# Patient Record
Sex: Female | Born: 1935 | Race: Black or African American | Hispanic: No | Marital: Single | Smoking: Never smoker
Health system: Southern US, Community
[De-identification: ages and names within clinical notes are randomized; demographics above are authoritative.]

## PROBLEM LIST (undated history)

## (undated) DIAGNOSIS — I10 Essential (primary) hypertension: Secondary | ICD-10-CM

## (undated) DIAGNOSIS — G839 Paralytic syndrome, unspecified: Secondary | ICD-10-CM

## (undated) DIAGNOSIS — I82409 Acute embolism and thrombosis of unspecified deep veins of unspecified lower extremity: Secondary | ICD-10-CM

## (undated) DIAGNOSIS — C50919 Malignant neoplasm of unspecified site of unspecified female breast: Secondary | ICD-10-CM

## (undated) DIAGNOSIS — C50911 Malignant neoplasm of unspecified site of right female breast: Secondary | ICD-10-CM

## (undated) HISTORY — PX: HERNIA REPAIR: SHX51

## (undated) HISTORY — PX: APPENDECTOMY: SHX54

## (undated) HISTORY — DX: Malignant neoplasm of unspecified site of right female breast: C50.911

## (undated) HISTORY — DX: Malignant neoplasm of unspecified site of unspecified female breast: C50.919

---

## 1997-08-19 ENCOUNTER — Ambulatory Visit (HOSPITAL_COMMUNITY): Admission: RE | Admit: 1997-08-19 | Discharge: 1997-08-19 | Payer: Self-pay | Admitting: *Deleted

## 1997-10-20 ENCOUNTER — Ambulatory Visit (HOSPITAL_COMMUNITY): Admission: RE | Admit: 1997-10-20 | Discharge: 1997-10-20 | Payer: Self-pay | Admitting: *Deleted

## 1998-08-01 ENCOUNTER — Other Ambulatory Visit: Admission: RE | Admit: 1998-08-01 | Discharge: 1998-08-01 | Payer: Self-pay | Admitting: Obstetrics

## 1998-11-04 ENCOUNTER — Encounter: Payer: Self-pay | Admitting: *Deleted

## 1998-11-04 ENCOUNTER — Ambulatory Visit (HOSPITAL_COMMUNITY): Admission: RE | Admit: 1998-11-04 | Discharge: 1998-11-04 | Payer: Self-pay | Admitting: *Deleted

## 1998-11-21 ENCOUNTER — Ambulatory Visit (HOSPITAL_COMMUNITY): Admission: RE | Admit: 1998-11-21 | Discharge: 1998-11-21 | Payer: Self-pay | Admitting: *Deleted

## 1999-11-21 ENCOUNTER — Ambulatory Visit (HOSPITAL_COMMUNITY): Admission: RE | Admit: 1999-11-21 | Discharge: 1999-11-21 | Payer: Self-pay | Admitting: *Deleted

## 1999-11-21 ENCOUNTER — Encounter: Payer: Self-pay | Admitting: *Deleted

## 2000-10-22 ENCOUNTER — Encounter: Payer: Self-pay | Admitting: *Deleted

## 2000-10-22 ENCOUNTER — Ambulatory Visit (HOSPITAL_COMMUNITY): Admission: RE | Admit: 2000-10-22 | Discharge: 2000-10-22 | Payer: Self-pay | Admitting: *Deleted

## 2001-10-24 ENCOUNTER — Ambulatory Visit (HOSPITAL_COMMUNITY): Admission: RE | Admit: 2001-10-24 | Discharge: 2001-10-24 | Payer: Self-pay | Admitting: *Deleted

## 2001-10-24 ENCOUNTER — Encounter: Payer: Self-pay | Admitting: *Deleted

## 2002-07-30 ENCOUNTER — Emergency Department (HOSPITAL_COMMUNITY): Admission: EM | Admit: 2002-07-30 | Discharge: 2002-07-30 | Payer: Self-pay | Admitting: Emergency Medicine

## 2002-07-30 ENCOUNTER — Encounter: Payer: Self-pay | Admitting: Emergency Medicine

## 2002-11-03 ENCOUNTER — Ambulatory Visit (HOSPITAL_COMMUNITY): Admission: RE | Admit: 2002-11-03 | Discharge: 2002-11-03 | Payer: Self-pay | Admitting: *Deleted

## 2002-11-03 ENCOUNTER — Encounter: Payer: Self-pay | Admitting: *Deleted

## 2003-04-05 ENCOUNTER — Ambulatory Visit (HOSPITAL_COMMUNITY): Admission: RE | Admit: 2003-04-05 | Discharge: 2003-04-05 | Payer: Self-pay | Admitting: Ophthalmology

## 2003-11-04 ENCOUNTER — Ambulatory Visit (HOSPITAL_COMMUNITY): Admission: RE | Admit: 2003-11-04 | Discharge: 2003-11-04 | Payer: Self-pay | Admitting: *Deleted

## 2004-11-06 ENCOUNTER — Ambulatory Visit (HOSPITAL_COMMUNITY): Admission: RE | Admit: 2004-11-06 | Discharge: 2004-11-06 | Payer: Self-pay | Admitting: Family Medicine

## 2005-11-09 ENCOUNTER — Ambulatory Visit (HOSPITAL_COMMUNITY): Admission: RE | Admit: 2005-11-09 | Discharge: 2005-11-09 | Payer: Self-pay | Admitting: Family Medicine

## 2006-10-24 ENCOUNTER — Ambulatory Visit (HOSPITAL_COMMUNITY): Admission: RE | Admit: 2006-10-24 | Discharge: 2006-10-24 | Payer: Self-pay | Admitting: Ophthalmology

## 2006-11-12 ENCOUNTER — Ambulatory Visit (HOSPITAL_COMMUNITY): Admission: RE | Admit: 2006-11-12 | Discharge: 2006-11-12 | Payer: Self-pay | Admitting: Family Medicine

## 2007-11-18 ENCOUNTER — Ambulatory Visit (HOSPITAL_COMMUNITY): Admission: RE | Admit: 2007-11-18 | Discharge: 2007-11-18 | Payer: Self-pay | Admitting: Internal Medicine

## 2008-11-19 ENCOUNTER — Ambulatory Visit (HOSPITAL_COMMUNITY): Admission: RE | Admit: 2008-11-19 | Discharge: 2008-11-19 | Payer: Self-pay | Admitting: Internal Medicine

## 2009-03-03 ENCOUNTER — Encounter: Payer: Self-pay | Admitting: Gastroenterology

## 2009-08-26 ENCOUNTER — Ambulatory Visit (HOSPITAL_COMMUNITY): Admission: RE | Admit: 2009-08-26 | Discharge: 2009-08-26 | Payer: Self-pay | Admitting: Internal Medicine

## 2009-08-26 ENCOUNTER — Encounter: Payer: Self-pay | Admitting: Orthopedic Surgery

## 2009-09-08 ENCOUNTER — Ambulatory Visit: Payer: Self-pay | Admitting: Orthopedic Surgery

## 2009-09-08 DIAGNOSIS — M5137 Other intervertebral disc degeneration, lumbosacral region: Secondary | ICD-10-CM

## 2009-09-08 DIAGNOSIS — M5430 Sciatica, unspecified side: Secondary | ICD-10-CM

## 2009-09-08 DIAGNOSIS — M4120 Other idiopathic scoliosis, site unspecified: Secondary | ICD-10-CM | POA: Insufficient documentation

## 2009-09-29 ENCOUNTER — Ambulatory Visit: Payer: Self-pay | Admitting: Orthopedic Surgery

## 2009-11-21 ENCOUNTER — Ambulatory Visit (HOSPITAL_COMMUNITY): Admission: RE | Admit: 2009-11-21 | Discharge: 2009-11-21 | Payer: Self-pay | Admitting: Internal Medicine

## 2010-05-30 NOTE — Assessment & Plan Note (Signed)
Summary: 3 wk RE-CHECK LT HIP,RESP TO MED/MEDICARE,MEDICAID/CAF   Visit Type:  Follow-up Referring Provider:  Dr. Felecia Shelling Primary Provider:  Dr. Felecia Shelling  CC:  RECHECK LEFT HIP.  History of Present Illness: I saw Angela Higgins in the office today for an initial visit.  She is a 77 years old woman with the complaint of:  left hip pain.   DX DDD W/ LUMBAR WITH SPINAL STENOSIS AND SCOLIOSIS   IMAGING L SPINE FILM HERE AND  HIP FILM [facility]   Meds: Lisinopril/HCTZ, Simvastatin, Tramadol, Naproxen 500, Vicodin 5, ASA.  Today is 3 week recheck on sciatica after prednisone pak and Neurontin 100 at hs.  The meds helped some, she still has tingling left leg.  She is much better with much less pain and just has some residual tingling in the LEFT leg. She has no problems with the Neurontin so far  Allergies: No Known Drug Allergies   Impression & Recommendations:  Problem # 1:  DEGENERATIVE DISC DISEASE, LUMBAR SPINE (ICD-722.52) Assessment Improved  Orders: Est. Patient Level II (16109)  Problem # 2:  SCOLIOSIS, LUMBAR SPINE (ICD-737.30) Assessment: Improved  Orders: Est. Patient Level II (60454)  Problem # 3:  SCIATICA (ICD-724.3) Assessment: Improved  Orders: Est. Patient Level II (09811)  Patient Instructions: 1)  continue gabapentin  at night and the naproxen 1 twice a day  2)  return as needed

## 2010-05-30 NOTE — Assessment & Plan Note (Signed)
Summary: LT HIP PAIN/XRAY [facility]/REF [facility]/MEDICARE,MCD/CAF   Vital Signs:  Patient profile:   77 year old female Height:      62 inches Weight:      116 pounds Pulse rate:   68 / minute Resp:     16 per minute  Vitals Entered By: Fuller Canada MD (Sep 08, 2009 10:16 AM)  Visit Type:  New patient Referring Provider:  Dr. Felecia Shelling Primary Provider:  Dr. Felecia Shelling  CC:  left hip pain.  History of Present Illness: I saw Angela Higgins in the office today for an initial visit.  She is a 77 years old woman with the complaint of:  left hip pain.  Xrays left hip 08/26/09 [facility] for review.  Meds: Lisinopril/HCTZ, Simvastatin, Tramadol, Naproxen 500, Vicodin 5, ASA.  Patient has pain for 4 weeks no injury, pain level is 10, constant, severe for the last 3 days, sudden onset, worse with moving, associated with numbness in the LEFT leg, unrelieved by Lortab 5 mg and tramadol.  Allergies (verified): No Known Drug Allergies  Past History:  Past Medical History: htn cholesterol  Past Surgical History: appendix hernia  Family History: Family History of Diabetes Family History of Arthritis  Social History: Patient is single.  retired no smoking no alcohol some caffeine use  Review of Systems Constitutional:  Complains of weight loss; denies weight gain, fever, chills, and fatigue. Cardiovascular:  Denies chest pain, palpitations, fainting, and murmurs. Respiratory:  Denies short of breath, wheezing, couch, tightness, pain on inspiration, and snoring . Gastrointestinal:  Complains of constipation; denies heartburn, nausea, vomiting, diarrhea, and blood in your stools. Genitourinary:  Denies frequency, urgency, difficulty urinating, painful urination, flank pain, and bleeding in urine. Neurologic:  Complains of tingling; denies numbness, unsteady gait, dizziness, tremors, and seizure. Musculoskeletal:  Denies joint pain, swelling, instability, stiffness, redness, heat, and muscle  pain. Endocrine:  Denies excessive thirst, exessive urination, and heat or cold intolerance. Psychiatric:  Denies nervousness, depression, anxiety, and hallucinations. Skin:  Complains of rash and itching; denies changes in the skin, poor healing, and redness. HEENT:  Denies blurred or double vision, eye pain, redness, and watering. Immunology:  Denies seasonal allergies, sinus problems, and allergic to bee stings. Hemoatologic:  Denies easy bleeding and brusing.  Physical Exam  Additional Exam:  Constitutional: vital signs see recorded values. General: normal development, nutrition, and grooming. No deformity. Body Habitus is small CDV: Observation and palpation was normal  Lymph: palpation of the lymph nodes were normal Skin: inspection and palpation of the skin revealed no abnormalities  Neuro: coordination: normal              DTR's abnormal              Sensation was normal  Psyche: Alert and oriented x 3. Mood was normal.  Affect: normal  MSK: Gait: normal   She has deformity of her feet with bunions bilaterally.  There is weakness in the LEFT foot and reflexes are zero in both knee and ankles of both limbs  Upper extremities show multiple small joint deformities as well.  There is thumb and palm deformity in the LEFT hand flexion deformity of LEFT wrist.  She has a negative straight leg raise.     Impression & Recommendations:  Problem # 1:  SCIATICA (ICD-724.3) Assessment New  Orders: New Patient Level III (16109)  Problem # 2:  SCOLIOSIS, LUMBAR SPINE (ICD-737.30) Assessment: New  Orders: New Patient Level III (60454)  Problem # 3:  DEGENERATIVE DISC DISEASE, LUMBAR SPINE (ICD-722.52) Assessment: New  x-ray of her LEFT hip shows no abnormality in the hip joint  She has x-rays of her lumbar spine which show a severe degenerative scoliosis with 40 curve in the lumbar region estimated.  She's not a surgical candidate we will try to treat her medically we will  start with a 12 day 5 mg prednisone Dosepak as well as Neurontin 100 mg at night  Orders: New Patient Level III (40981)  Patient Instructions: 1)  SCOLIOSIS [CURVE IN THE SPINE ] 2)  ARTHRITIS 3)  WITH  DISC DISEASE 4)  LETS TRY SOME MEDICATION A ND SEE IF THIS WILL DO THE TRICK  5)  RETURN IN 3 WEEKS

## 2010-05-30 NOTE — Letter (Signed)
Summary: History form  History form   Imported By: Jacklynn Ganong 09/14/2009 09:12:40  _____________________________________________________________________  External Attachment:    Type:   Image     Comment:   External Document

## 2010-05-30 NOTE — Medication Information (Signed)
Summary: Tax adviser   Imported By: Cammie Sickle 09/10/2009 17:39:54  _____________________________________________________________________  External Attachment:    Type:   Image     Comment:   External Document

## 2010-10-18 ENCOUNTER — Other Ambulatory Visit (HOSPITAL_COMMUNITY): Payer: Self-pay | Admitting: Internal Medicine

## 2010-10-18 DIAGNOSIS — Z1231 Encounter for screening mammogram for malignant neoplasm of breast: Secondary | ICD-10-CM

## 2010-11-23 ENCOUNTER — Ambulatory Visit (HOSPITAL_COMMUNITY)
Admission: RE | Admit: 2010-11-23 | Discharge: 2010-11-23 | Disposition: A | Discharge status: Home / Self Care | Payer: Medicare Other | Source: Ambulatory Visit | Attending: Internal Medicine | Admitting: Internal Medicine

## 2010-11-23 DIAGNOSIS — Z1231 Encounter for screening mammogram for malignant neoplasm of breast: Secondary | ICD-10-CM

## 2011-02-14 LAB — BASIC METABOLIC PANEL
CO2: 28
Calcium: 9.8
Chloride: 108
Creatinine, Ser: 0.97
GFR calc Af Amer: >60
Glucose, Bld: 96

## 2011-07-17 DIAGNOSIS — I739 Peripheral vascular disease, unspecified: Secondary | ICD-10-CM | POA: Diagnosis not present

## 2011-07-17 DIAGNOSIS — I10 Essential (primary) hypertension: Secondary | ICD-10-CM | POA: Diagnosis not present

## 2011-10-17 ENCOUNTER — Other Ambulatory Visit (HOSPITAL_COMMUNITY): Payer: Self-pay | Admitting: Internal Medicine

## 2011-10-17 DIAGNOSIS — Z1231 Encounter for screening mammogram for malignant neoplasm of breast: Secondary | ICD-10-CM

## 2011-10-29 DIAGNOSIS — I6789 Other cerebrovascular disease: Secondary | ICD-10-CM | POA: Diagnosis not present

## 2011-10-29 DIAGNOSIS — I10 Essential (primary) hypertension: Secondary | ICD-10-CM | POA: Diagnosis not present

## 2011-10-29 DIAGNOSIS — M1990 Unspecified osteoarthritis, unspecified site: Secondary | ICD-10-CM | POA: Diagnosis not present

## 2011-11-13 DIAGNOSIS — H35369 Drusen (degenerative) of macula, unspecified eye: Secondary | ICD-10-CM | POA: Diagnosis not present

## 2011-11-13 DIAGNOSIS — H33329 Round hole, unspecified eye: Secondary | ICD-10-CM | POA: Diagnosis not present

## 2011-11-13 DIAGNOSIS — H52229 Regular astigmatism, unspecified eye: Secondary | ICD-10-CM | POA: Diagnosis not present

## 2011-11-13 DIAGNOSIS — H1010 Acute atopic conjunctivitis, unspecified eye: Secondary | ICD-10-CM | POA: Diagnosis not present

## 2011-11-27 ENCOUNTER — Ambulatory Visit (HOSPITAL_COMMUNITY)
Admission: RE | Admit: 2011-11-27 | Discharge: 2011-11-27 | Disposition: A | Discharge status: Home / Self Care | Payer: Medicare Other | Source: Ambulatory Visit | Attending: Internal Medicine | Admitting: Internal Medicine

## 2011-11-27 DIAGNOSIS — Z1231 Encounter for screening mammogram for malignant neoplasm of breast: Secondary | ICD-10-CM | POA: Insufficient documentation

## 2011-12-20 DIAGNOSIS — Z961 Presence of intraocular lens: Secondary | ICD-10-CM | POA: Diagnosis not present

## 2011-12-20 DIAGNOSIS — H353190 Nonexudative age-related macular degeneration, unspecified eye, stage unspecified: Secondary | ICD-10-CM | POA: Diagnosis not present

## 2011-12-20 DIAGNOSIS — H35369 Drusen (degenerative) of macula, unspecified eye: Secondary | ICD-10-CM | POA: Diagnosis not present

## 2012-02-19 DIAGNOSIS — Z23 Encounter for immunization: Secondary | ICD-10-CM | POA: Diagnosis not present

## 2012-02-19 DIAGNOSIS — I6789 Other cerebrovascular disease: Secondary | ICD-10-CM | POA: Diagnosis not present

## 2012-02-19 DIAGNOSIS — I10 Essential (primary) hypertension: Secondary | ICD-10-CM | POA: Diagnosis not present

## 2012-05-19 DIAGNOSIS — D649 Anemia, unspecified: Secondary | ICD-10-CM | POA: Diagnosis not present

## 2012-05-19 DIAGNOSIS — I6789 Other cerebrovascular disease: Secondary | ICD-10-CM | POA: Diagnosis not present

## 2012-05-19 DIAGNOSIS — I10 Essential (primary) hypertension: Secondary | ICD-10-CM | POA: Diagnosis not present

## 2012-07-19 ENCOUNTER — Encounter (HOSPITAL_COMMUNITY): Payer: Self-pay

## 2012-07-19 ENCOUNTER — Emergency Department (HOSPITAL_COMMUNITY)
Admission: EM | Admit: 2012-07-19 | Discharge: 2012-07-19 | Disposition: A | Discharge status: Home / Self Care | Payer: Medicare Other | Attending: Emergency Medicine | Admitting: Emergency Medicine

## 2012-07-19 ENCOUNTER — Emergency Department (HOSPITAL_COMMUNITY): Payer: Medicare Other

## 2012-07-19 DIAGNOSIS — G8320 Monoplegia of upper limb affecting unspecified side: Secondary | ICD-10-CM | POA: Insufficient documentation

## 2012-07-19 DIAGNOSIS — Y939 Activity, unspecified: Secondary | ICD-10-CM | POA: Insufficient documentation

## 2012-07-19 DIAGNOSIS — M7989 Other specified soft tissue disorders: Secondary | ICD-10-CM | POA: Diagnosis not present

## 2012-07-19 DIAGNOSIS — I10 Essential (primary) hypertension: Secondary | ICD-10-CM | POA: Insufficient documentation

## 2012-07-19 DIAGNOSIS — W010XXA Fall on same level from slipping, tripping and stumbling without subsequent striking against object, initial encounter: Secondary | ICD-10-CM | POA: Insufficient documentation

## 2012-07-19 DIAGNOSIS — Y92009 Unspecified place in unspecified non-institutional (private) residence as the place of occurrence of the external cause: Secondary | ICD-10-CM | POA: Insufficient documentation

## 2012-07-19 DIAGNOSIS — S60229A Contusion of unspecified hand, initial encounter: Secondary | ICD-10-CM | POA: Insufficient documentation

## 2012-07-19 DIAGNOSIS — S60222A Contusion of left hand, initial encounter: Secondary | ICD-10-CM

## 2012-07-19 DIAGNOSIS — M79609 Pain in unspecified limb: Secondary | ICD-10-CM | POA: Diagnosis not present

## 2012-07-19 HISTORY — DX: Essential (primary) hypertension: I10

## 2012-07-19 HISTORY — DX: Paralytic syndrome, unspecified: G83.9

## 2012-07-19 NOTE — ED Notes (Signed)
Pt reports slipped and fell today and has large knot on top of left hand.  Denies any pain anywhere else.

## 2012-07-19 NOTE — ED Provider Notes (Signed)
History    This chart was scribed for Donnetta Hutching, MD, by Frederik Pear, ED scribe. The patient was seen in room [facility]18[facility] and the patient's care was started at 2011.    CSN: [id number]  Arrival date & time 07/19/12  1702   First MD Initiated Contact with Patient 07/19/12 2011      Chief Complaint  Patient presents with  . Hand Pain    (Consider location/radiation/quality/duration/timing/severity/associated sxs/prior treatment) The history is provided by the patient. No language interpreter was used.   Angela Higgins is a 79 y.o. female who presents to the Emergency Department complaining of sudden onset, constant, non-radiating left hand pain that is aggravated with movement and improved by holding it still and began earlier today when she fell in her home and hit her hand on the floor. She denies hitting her head or any LOC. She denies any other pain or symptoms. 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   Past Medical History  Diagnosis Date  . Hypertension   . Paralysis     Past Surgical History  Procedure Laterality Date  . Appendectomy    . Hernia repair      No family history on file.  History  Substance Use Topics  . Smoking status: Never Smoker   . Smokeless tobacco: Not on file  . Alcohol Use: No    OB History   Grav Para Term Preterm Abortions TAB SAB Ect Mult Living                  Review of Systems  Allergies  Review of patient's allergies indicates no known allergies.  Home Medications  No current outpatient prescriptions on file.  BP 198/66  Pulse 63  Temp(Src) 98 F (36.7 C) (Oral)  Resp 20  Wt 116 lb (52.617 kg)  BMI 21.21 kg/m2  SpO2 100%  Physical Exam  Nursing note and vitals reviewed. Constitutional: She is oriented to person, place, and time. She appears well-developed and well-nourished.  HENT:  Head: Normocephalic and atraumatic.  Eyes: Conjunctivae and EOM are normal. Pupils are equal, round, and reactive to light.   Neck: Normal range of motion. Neck supple.  Cardiovascular: Normal rate, regular rhythm and normal heart sounds.   Pulmonary/Chest: Effort normal and breath sounds normal.  Abdominal: Soft. Bowel sounds are normal.  Musculoskeletal: Normal range of motion.  There is a 3x5 cm hematoma on the dorsum of the left hand.  Neurological: She is alert and oriented to person, place, and time. No cranial nerve deficit.  Neurovascularly intact.  Skin: Skin is warm and dry.  Psychiatric: She has a normal mood and affect.    ED Course  Procedures (including critical care time)  DIAGNOSTIC STUDIES: Oxygen Saturation is 100% on room air, normal by my interpretation.    COORDINATION OF CARE:  20:30- Discussed planned course of treatment with the patient, including wrapping the hand and icing the area, who is agreeable at this time.  Labs Reviewed - No data to display Dg Hand Complete Left  07/19/2012  *RADIOLOGY REPORT*  Clinical Data: Hand pain and swelling.  The patient is contracted.  LEFT HAND - COMPLETE 3+ VIEW  Comparison: None  Findings: The patient's hand is contracted. No definite acute fracture, subluxation or dislocation identified. Remote appearing distal radial and ulnar fractures noted.  No focal bony lesions are present. Dorsal soft tissue swelling is noted.  IMPRESSION: Soft tissue swelling without acute bony abnormality.   Original Report  Authenticated By: Harmon Pier, M.D.    No diagnosis found.    MDM  Left hand X-ray negative. Physical exam suggests hematoma. No other injuries  I personally performed the services described in this documentation, which was scribed in my presence. The recorded information has been reviewed and is accurate.        Donnetta Hutching, MD 07/19/12 2118

## 2012-08-07 DIAGNOSIS — S60229A Contusion of unspecified hand, initial encounter: Secondary | ICD-10-CM | POA: Diagnosis not present

## 2012-08-11 DIAGNOSIS — S60229A Contusion of unspecified hand, initial encounter: Secondary | ICD-10-CM | POA: Diagnosis not present

## 2012-09-08 DIAGNOSIS — I6789 Other cerebrovascular disease: Secondary | ICD-10-CM | POA: Diagnosis not present

## 2012-09-08 DIAGNOSIS — I10 Essential (primary) hypertension: Secondary | ICD-10-CM | POA: Diagnosis not present

## 2012-10-29 ENCOUNTER — Other Ambulatory Visit (HOSPITAL_COMMUNITY): Payer: Self-pay | Admitting: Internal Medicine

## 2012-10-29 DIAGNOSIS — Z1231 Encounter for screening mammogram for malignant neoplasm of breast: Secondary | ICD-10-CM

## 2012-11-27 ENCOUNTER — Ambulatory Visit (HOSPITAL_COMMUNITY): Payer: Medicare Other

## 2012-11-27 ENCOUNTER — Ambulatory Visit (HOSPITAL_COMMUNITY)
Admission: RE | Admit: 2012-11-27 | Discharge: 2012-11-27 | Disposition: A | Discharge status: Home / Self Care | Payer: Medicare Other | Source: Ambulatory Visit | Attending: Internal Medicine | Admitting: Internal Medicine

## 2012-11-27 DIAGNOSIS — Z1231 Encounter for screening mammogram for malignant neoplasm of breast: Secondary | ICD-10-CM | POA: Insufficient documentation

## 2012-11-28 ENCOUNTER — Other Ambulatory Visit: Payer: Self-pay | Admitting: Internal Medicine

## 2012-11-28 DIAGNOSIS — R928 Other abnormal and inconclusive findings on diagnostic imaging of breast: Secondary | ICD-10-CM

## 2012-12-08 DIAGNOSIS — I6789 Other cerebrovascular disease: Secondary | ICD-10-CM | POA: Diagnosis not present

## 2012-12-08 DIAGNOSIS — I10 Essential (primary) hypertension: Secondary | ICD-10-CM | POA: Diagnosis not present

## 2012-12-16 DIAGNOSIS — I10 Essential (primary) hypertension: Secondary | ICD-10-CM | POA: Diagnosis not present

## 2012-12-17 ENCOUNTER — Ambulatory Visit
Admission: RE | Admit: 2012-12-17 | Discharge: 2012-12-17 | Disposition: A | Discharge status: Home / Self Care | Payer: Medicare Other | Source: Ambulatory Visit | Attending: Internal Medicine | Admitting: Internal Medicine

## 2012-12-17 DIAGNOSIS — R928 Other abnormal and inconclusive findings on diagnostic imaging of breast: Secondary | ICD-10-CM

## 2013-01-08 DIAGNOSIS — H35369 Drusen (degenerative) of macula, unspecified eye: Secondary | ICD-10-CM | POA: Diagnosis not present

## 2013-01-08 DIAGNOSIS — H524 Presbyopia: Secondary | ICD-10-CM | POA: Diagnosis not present

## 2013-01-08 DIAGNOSIS — Z961 Presence of intraocular lens: Secondary | ICD-10-CM | POA: Diagnosis not present

## 2013-01-08 DIAGNOSIS — H5200 Hypermetropia, unspecified eye: Secondary | ICD-10-CM | POA: Diagnosis not present

## 2013-01-12 DIAGNOSIS — Z23 Encounter for immunization: Secondary | ICD-10-CM | POA: Diagnosis not present

## 2013-03-09 DIAGNOSIS — M1990 Unspecified osteoarthritis, unspecified site: Secondary | ICD-10-CM | POA: Diagnosis not present

## 2013-03-09 DIAGNOSIS — Z23 Encounter for immunization: Secondary | ICD-10-CM | POA: Diagnosis not present

## 2013-03-09 DIAGNOSIS — I6789 Other cerebrovascular disease: Secondary | ICD-10-CM | POA: Diagnosis not present

## 2013-03-09 DIAGNOSIS — I10 Essential (primary) hypertension: Secondary | ICD-10-CM | POA: Diagnosis not present

## 2013-05-05 DIAGNOSIS — I6789 Other cerebrovascular disease: Secondary | ICD-10-CM | POA: Diagnosis not present

## 2013-06-22 DIAGNOSIS — I6789 Other cerebrovascular disease: Secondary | ICD-10-CM | POA: Diagnosis not present

## 2013-06-22 DIAGNOSIS — I10 Essential (primary) hypertension: Secondary | ICD-10-CM | POA: Diagnosis not present

## 2013-09-18 DIAGNOSIS — I6789 Other cerebrovascular disease: Secondary | ICD-10-CM | POA: Diagnosis not present

## 2013-09-18 DIAGNOSIS — I10 Essential (primary) hypertension: Secondary | ICD-10-CM | POA: Diagnosis not present

## 2013-09-18 DIAGNOSIS — M1990 Unspecified osteoarthritis, unspecified site: Secondary | ICD-10-CM | POA: Diagnosis not present

## 2013-10-26 ENCOUNTER — Other Ambulatory Visit (HOSPITAL_COMMUNITY): Payer: Self-pay | Admitting: Internal Medicine

## 2013-10-26 DIAGNOSIS — Z1231 Encounter for screening mammogram for malignant neoplasm of breast: Secondary | ICD-10-CM

## 2013-12-16 ENCOUNTER — Ambulatory Visit (HOSPITAL_COMMUNITY): Payer: Medicare Other

## 2013-12-24 ENCOUNTER — Ambulatory Visit (HOSPITAL_COMMUNITY): Payer: Medicare Other

## 2013-12-25 ENCOUNTER — Ambulatory Visit (HOSPITAL_COMMUNITY)
Admission: RE | Admit: 2013-12-25 | Discharge: 2013-12-25 | Disposition: A | Discharge status: Home / Self Care | Payer: Medicare HMO | Source: Ambulatory Visit | Attending: Internal Medicine | Admitting: Internal Medicine

## 2013-12-25 DIAGNOSIS — Z1231 Encounter for screening mammogram for malignant neoplasm of breast: Secondary | ICD-10-CM | POA: Insufficient documentation

## 2014-01-07 DIAGNOSIS — M1710 Unilateral primary osteoarthritis, unspecified knee: Secondary | ICD-10-CM | POA: Diagnosis not present

## 2014-01-07 DIAGNOSIS — Z23 Encounter for immunization: Secondary | ICD-10-CM | POA: Diagnosis not present

## 2014-01-07 DIAGNOSIS — IMO0002 Reserved for concepts with insufficient information to code with codable children: Secondary | ICD-10-CM | POA: Diagnosis not present

## 2014-01-07 DIAGNOSIS — I6350 Cerebral infarction due to unspecified occlusion or stenosis of unspecified cerebral artery: Secondary | ICD-10-CM | POA: Diagnosis not present

## 2014-01-07 DIAGNOSIS — I69959 Hemiplegia and hemiparesis following unspecified cerebrovascular disease affecting unspecified side: Secondary | ICD-10-CM | POA: Diagnosis not present

## 2014-01-07 DIAGNOSIS — R799 Abnormal finding of blood chemistry, unspecified: Secondary | ICD-10-CM | POA: Diagnosis not present

## 2014-01-07 DIAGNOSIS — R7309 Other abnormal glucose: Secondary | ICD-10-CM | POA: Diagnosis not present

## 2014-01-07 DIAGNOSIS — I10 Essential (primary) hypertension: Secondary | ICD-10-CM | POA: Diagnosis not present

## 2014-04-27 DIAGNOSIS — I6350 Cerebral infarction due to unspecified occlusion or stenosis of unspecified cerebral artery: Secondary | ICD-10-CM | POA: Diagnosis not present

## 2014-04-27 DIAGNOSIS — G8190 Hemiplegia, unspecified affecting unspecified side: Secondary | ICD-10-CM | POA: Diagnosis not present

## 2014-04-27 DIAGNOSIS — I10 Essential (primary) hypertension: Secondary | ICD-10-CM | POA: Diagnosis not present

## 2014-07-27 DIAGNOSIS — I6350 Cerebral infarction due to unspecified occlusion or stenosis of unspecified cerebral artery: Secondary | ICD-10-CM | POA: Diagnosis not present

## 2014-07-27 DIAGNOSIS — G8190 Hemiplegia, unspecified affecting unspecified side: Secondary | ICD-10-CM | POA: Diagnosis not present

## 2014-07-27 DIAGNOSIS — I10 Essential (primary) hypertension: Secondary | ICD-10-CM | POA: Diagnosis not present

## 2014-11-30 DIAGNOSIS — I6350 Cerebral infarction due to unspecified occlusion or stenosis of unspecified cerebral artery: Secondary | ICD-10-CM | POA: Diagnosis not present

## 2014-11-30 DIAGNOSIS — M159 Polyosteoarthritis, unspecified: Secondary | ICD-10-CM | POA: Diagnosis not present

## 2014-11-30 DIAGNOSIS — G8190 Hemiplegia, unspecified affecting unspecified side: Secondary | ICD-10-CM | POA: Diagnosis not present

## 2014-11-30 DIAGNOSIS — I10 Essential (primary) hypertension: Secondary | ICD-10-CM | POA: Diagnosis not present

## 2014-11-30 DIAGNOSIS — Z23 Encounter for immunization: Secondary | ICD-10-CM | POA: Diagnosis not present

## 2014-12-07 ENCOUNTER — Other Ambulatory Visit (HOSPITAL_COMMUNITY): Payer: Self-pay | Admitting: Internal Medicine

## 2014-12-07 DIAGNOSIS — Z1231 Encounter for screening mammogram for malignant neoplasm of breast: Secondary | ICD-10-CM

## 2014-12-27 ENCOUNTER — Ambulatory Visit (HOSPITAL_COMMUNITY)
Admission: RE | Admit: 2014-12-27 | Discharge: 2014-12-27 | Disposition: A | Discharge status: Home / Self Care | Payer: Medicare Other | Source: Ambulatory Visit | Attending: Internal Medicine | Admitting: Internal Medicine

## 2014-12-27 DIAGNOSIS — Z1231 Encounter for screening mammogram for malignant neoplasm of breast: Secondary | ICD-10-CM | POA: Diagnosis not present

## 2015-04-05 DIAGNOSIS — M1711 Unilateral primary osteoarthritis, right knee: Secondary | ICD-10-CM | POA: Diagnosis not present

## 2015-04-05 DIAGNOSIS — I10 Essential (primary) hypertension: Secondary | ICD-10-CM | POA: Diagnosis not present

## 2015-04-05 DIAGNOSIS — Z23 Encounter for immunization: Secondary | ICD-10-CM | POA: Diagnosis not present

## 2015-04-05 DIAGNOSIS — G8190 Hemiplegia, unspecified affecting unspecified side: Secondary | ICD-10-CM | POA: Diagnosis not present

## 2015-07-05 DIAGNOSIS — M159 Polyosteoarthritis, unspecified: Secondary | ICD-10-CM | POA: Diagnosis not present

## 2015-07-05 DIAGNOSIS — I10 Essential (primary) hypertension: Secondary | ICD-10-CM | POA: Diagnosis not present

## 2015-07-05 DIAGNOSIS — G8190 Hemiplegia, unspecified affecting unspecified side: Secondary | ICD-10-CM | POA: Diagnosis not present

## 2015-07-08 DIAGNOSIS — H3562 Retinal hemorrhage, left eye: Secondary | ICD-10-CM | POA: Diagnosis not present

## 2015-07-08 DIAGNOSIS — Z961 Presence of intraocular lens: Secondary | ICD-10-CM | POA: Diagnosis not present

## 2015-07-08 DIAGNOSIS — H524 Presbyopia: Secondary | ICD-10-CM | POA: Diagnosis not present

## 2015-07-08 DIAGNOSIS — H1045 Other chronic allergic conjunctivitis: Secondary | ICD-10-CM | POA: Diagnosis not present

## 2015-10-04 DIAGNOSIS — G8190 Hemiplegia, unspecified affecting unspecified side: Secondary | ICD-10-CM | POA: Diagnosis not present

## 2015-10-04 DIAGNOSIS — I10 Essential (primary) hypertension: Secondary | ICD-10-CM | POA: Diagnosis not present

## 2015-10-04 DIAGNOSIS — M159 Polyosteoarthritis, unspecified: Secondary | ICD-10-CM | POA: Diagnosis not present

## 2015-11-08 DIAGNOSIS — G8190 Hemiplegia, unspecified affecting unspecified side: Secondary | ICD-10-CM | POA: Diagnosis not present

## 2015-11-08 DIAGNOSIS — I10 Essential (primary) hypertension: Secondary | ICD-10-CM | POA: Diagnosis not present

## 2015-12-23 ENCOUNTER — Other Ambulatory Visit: Payer: Self-pay | Admitting: Internal Medicine

## 2015-12-23 DIAGNOSIS — Z1231 Encounter for screening mammogram for malignant neoplasm of breast: Secondary | ICD-10-CM

## 2016-01-04 ENCOUNTER — Ambulatory Visit
Admission: RE | Admit: 2016-01-04 | Discharge: 2016-01-04 | Disposition: A | Discharge status: Home / Self Care | Payer: Medicare Other | Source: Ambulatory Visit | Attending: Internal Medicine | Admitting: Internal Medicine

## 2016-01-04 DIAGNOSIS — Z1231 Encounter for screening mammogram for malignant neoplasm of breast: Secondary | ICD-10-CM

## 2016-01-05 ENCOUNTER — Other Ambulatory Visit: Payer: Self-pay | Admitting: Internal Medicine

## 2016-01-05 DIAGNOSIS — R928 Other abnormal and inconclusive findings on diagnostic imaging of breast: Secondary | ICD-10-CM

## 2016-01-09 DIAGNOSIS — G8190 Hemiplegia, unspecified affecting unspecified side: Secondary | ICD-10-CM | POA: Diagnosis not present

## 2016-01-09 DIAGNOSIS — Z0000 Encounter for general adult medical examination without abnormal findings: Secondary | ICD-10-CM | POA: Diagnosis not present

## 2016-01-09 DIAGNOSIS — I6350 Cerebral infarction due to unspecified occlusion or stenosis of unspecified cerebral artery: Secondary | ICD-10-CM | POA: Diagnosis not present

## 2016-01-09 DIAGNOSIS — Z23 Encounter for immunization: Secondary | ICD-10-CM | POA: Diagnosis not present

## 2016-01-09 DIAGNOSIS — I10 Essential (primary) hypertension: Secondary | ICD-10-CM | POA: Diagnosis not present

## 2016-01-09 DIAGNOSIS — M159 Polyosteoarthritis, unspecified: Secondary | ICD-10-CM | POA: Diagnosis not present

## 2016-01-12 ENCOUNTER — Ambulatory Visit
Admission: RE | Admit: 2016-01-12 | Discharge: 2016-01-12 | Disposition: A | Discharge status: Home / Self Care | Payer: Medicare Other | Source: Ambulatory Visit | Attending: Internal Medicine | Admitting: Internal Medicine

## 2016-01-12 ENCOUNTER — Other Ambulatory Visit: Payer: Self-pay | Admitting: Internal Medicine

## 2016-01-12 DIAGNOSIS — R928 Other abnormal and inconclusive findings on diagnostic imaging of breast: Secondary | ICD-10-CM

## 2016-01-12 DIAGNOSIS — R921 Mammographic calcification found on diagnostic imaging of breast: Secondary | ICD-10-CM | POA: Diagnosis not present

## 2016-01-18 ENCOUNTER — Ambulatory Visit
Admission: RE | Admit: 2016-01-18 | Discharge: 2016-01-18 | Disposition: A | Discharge status: Home / Self Care | Payer: Medicare Other | Source: Ambulatory Visit | Attending: Internal Medicine | Admitting: Internal Medicine

## 2016-01-18 DIAGNOSIS — R928 Other abnormal and inconclusive findings on diagnostic imaging of breast: Secondary | ICD-10-CM

## 2016-01-18 DIAGNOSIS — R921 Mammographic calcification found on diagnostic imaging of breast: Secondary | ICD-10-CM | POA: Diagnosis not present

## 2016-01-18 DIAGNOSIS — C50911 Malignant neoplasm of unspecified site of right female breast: Secondary | ICD-10-CM | POA: Diagnosis not present

## 2016-01-24 DIAGNOSIS — C50911 Malignant neoplasm of unspecified site of right female breast: Secondary | ICD-10-CM | POA: Diagnosis not present

## 2016-01-27 DIAGNOSIS — C50911 Malignant neoplasm of unspecified site of right female breast: Secondary | ICD-10-CM | POA: Diagnosis not present

## 2016-01-31 DIAGNOSIS — C50911 Malignant neoplasm of unspecified site of right female breast: Secondary | ICD-10-CM | POA: Diagnosis not present

## 2016-02-08 ENCOUNTER — Encounter (HOSPITAL_COMMUNITY): Payer: Medicare Other | Attending: Hematology & Oncology | Admitting: Hematology & Oncology

## 2016-02-08 ENCOUNTER — Encounter (HOSPITAL_COMMUNITY): Payer: Self-pay | Admitting: Hematology & Oncology

## 2016-02-08 VITALS — BP 172/56 | HR 56 | Temp 97.6°F | Resp 16 | Ht 62.0 in | Wt 110.0 lb

## 2016-02-08 DIAGNOSIS — R531 Weakness: Secondary | ICD-10-CM

## 2016-02-08 DIAGNOSIS — C50911 Malignant neoplasm of unspecified site of right female breast: Secondary | ICD-10-CM

## 2016-02-08 DIAGNOSIS — R54 Age-related physical debility: Secondary | ICD-10-CM

## 2016-02-08 MED ORDER — ANASTROZOLE 1 MG PO TABS: 1.0000 mg | ORAL_TABLET | Freq: Every day | ORAL | 2 refills | Status: DC

## 2016-02-08 NOTE — Patient Instructions (Addendum)
[place] at [facility] Discharge Instructions  RECOMMENDATIONS MADE BY THE CONSULTANT AND ANY TEST RESULTS WILL BE SENT TO YOUR REFERRING PHYSICIAN.  You saw Dr. Whitney Muse today. Follow up in 6 weeks with lab work.  Thank you for choosing [place] at [facility] to provide your oncology and hematology care.  To afford each patient quality time with our provider, please arrive at least 15 minutes before your scheduled appointment time.   Beginning January 23rd 2017 lab work for the [facility] will be done in the  Main lab at [facility] on 1st floor. If you have a lab appointment with the [facility] please come in thru the  Main Entrance and check in at the main information desk  You need to re-schedule your appointment should you arrive 10 or more minutes late.  We strive to give you quality time with our providers, and arriving late affects you and other patients whose appointments are after yours.  Also, if you no show three or more times for appointments you may be dismissed from the clinic at the providers discretion.     Again, thank you for choosing [facility].  Our hope is that these requests will decrease the amount of time that you wait before being seen by our physicians.       _____________________________________________________________  Should you have questions after your visit to [facility], please contact our office at [phone] between the hours of 8:30 a.m. and 4:30 p.m.  Voicemails left after 4:30 p.m. will not be returned until the following business day.  For prescription refill requests, have your pharmacy contact our office.         Resources For Cancer Patients and their Caregivers ? American Cancer Society: Can assist with transportation, wigs, general needs, runs Look Good Feel Better.        (551)101-1656 ? Cancer Care: Provides financial assistance, online support groups,  medication/co-pay assistance.  1-800-813-HOPE (780)606-7882) ? [facility] Assists [place] cancer patients and their families through emotional , educational and financial support.  309 349 4441 ? [place] DSS Where to apply for food stamps, Medicaid and utility assistance. (936)419-2421 ? [facility]: Transportation to medical appointments. 7544931220 ? Social Security Administration: May apply for disability if have a Stage IV cancer. 803-044-0888 4703398166 ? [employer], Disability and Transit Services: Assists with nutrition, care and transit needs. [place] Support Programs: @10RELATIVEDAYS @ > Cancer Support Group  2nd Tuesday of the month 1pm-2pm, Journey Room  > Creative Journey  3rd Tuesday of the month 1130am-1pm, Journey Room  > Look Good Feel Better  1st Wednesday of the month 10am-12 noon, Journey Room (Call [place] to register [phone])

## 2016-02-08 NOTE — Progress Notes (Signed)
[place]  CONSULT NOTE  No care team member to display  CHIEF COMPLAINTS/PURPOSE OF CONSULTATION:    Hormone receptor positive breast cancer, right ([place])   01/04/2016 Mammogram    Screening mammogram, calcifications in R breast      01/12/2016 Mammogram    Diagnostic mammogram There are coarse and heterogeneous calcifications in the inferior medial right breast spanning 5.1 cm. There are some scattered similar-appearing calcifications behind the nipple at a posterior depth as well      01/18/2016 Mammogram    Mammographic images were obtained following stereotactic guided biopsy of right breast calcifications. The coil shaped marker is in good position.  IMPRESSION: Appropriate placement of biopsy marker      01/18/2016 Initial Biopsy    Stereotactic-guided biopsy of right breast calcifications. No apparent complications      3/54/6568 Pathology Results    Breast, right, needle core biopsy, medial SMALL FOCUS OF INVASIVE DUCTAL CARCINOMA, DUCTAL CARCINOMA IN SITU WITH CALCIFICATIONS AND NECROSIS, GRADE 3 Estrogen Receptor: 100%, POSITIVE, STRONG STAINING INTENSITY Progesterone Receptor: 60%, POSITIVE, STRONG STAINING INTENSITY Proliferation Marker Ki67: 5% HER2 - **POSITIVE** RATIO OF HER2/CEP17 SIGNALS 2.60 AVERAGE HER2 COPY NUMBER PER CELL 3.90        HISTORY OF PRESENTING ILLNESS:  Angela Higgins 82 y.o. female is here because of referral from Dr. Aviva Higgins for newly diagnosed right breast cancer. Disease is ER+ HER 2 +  [name] is a pleasant 82 y.o. woman who presented for routine mammogram on 01/04/2016 and was found to have calcifications in the right breast. Diagnostic mammogram of the right breast on 01/12/2016 showed development of coarse and heterogeneous calcifications in the inferior medial right breast spanning 5.1 cm with similar appearing calcifications at a posterior depth behind the right nipple.  Her PCP Dr. Legrand Higgins then  referred her to Dr. Arnoldo Higgins. She underwent stereotactic biopsy of the right medial breast on 01/18/2016 which showed a small focus of invasive ductal carcinoma with DCIS with calcifications and necrosis, Grade 3. This was found to be triple positive, ER=100% PR=60% Ki-67=5% and HER-2 positive. There was another area of suspicion noted in the retro areolar area of the right breast, however this was not biopsied at the time. The patient has not yet undergone breast surgery because she wanted to speak with oncology for a second opinion before proceeding. Dr. Arnoldo Higgins last spoke to the patient on 01/31/2016 about her surgical options.  She presents to the [facility] with her niece. She ambulates with the use of a cane. I personally reviewed and went over pathology results with the patient.  She is not interested in having surgery at her age. When asked if she is fearful of surgery or having her breast removed, she states "I don't know". Her niece notes that her right side is her good side. She states, "I don't want to burn", in reference to radiation therapy. I discussed with the patient that radiation therapy could be avoided given her age and goals of care.   She feels pretty good and eats well. She has received a flu shot this year.  She gets annual mammograms. Notes that they always had a problem with her right breast. She did alright with her breast biopsy.  The patient is here for further evaluation and discussion of newly diagnosed triple positive right breast cancer.   MEDICAL HISTORY:  Past Medical History:  Diagnosis Date  . Breast cancer ([place])   . Hypertension   .  Paralysis ([place])     SURGICAL HISTORY: Past Surgical History:  Procedure Laterality Date  . APPENDECTOMY    . HERNIA REPAIR      SOCIAL HISTORY: Social History   Social History  . Marital status: Single    Spouse name: N/A  . Number of children: N/A  . Years of education: N/A   Occupational History  . Not on file.    Social History Main Topics  . Smoking status: Never Smoker  . Smokeless tobacco: Never Used  . Alcohol use No  . Drug use: No  . Sexual activity: No   Other Topics Concern  . Not on file   Social History Narrative  . No narrative on file   Single Has a dog She lives with her sister Non smoker She always liked to travel She used to babysit   FAMILY HISTORY: History reviewed. No pertinent family history.  Mother deceased at 73 yo of an aneurysm Father deceased at 82 yo of old age or prostate cancer Sister has breast cancer  ALLERGIES:  has No Known Allergies.  MEDICATIONS:  Current Outpatient Prescriptions  Medication Sig Dispense Refill  . amLODipine (NORVASC) 5 MG tablet     . aspirin EC 81 MG tablet Take 81 mg by mouth at bedtime.    . baclofen (LIORESAL) 10 MG tablet     . lisinopril-hydrochlorothiazide (PRINZIDE,ZESTORETIC) 20-25 MG tablet     . Multiple Vitamin (MULTIVITAMIN WITH MINERALS) TABS Take 1 tablet by mouth every morning.    Marland Kitchen anastrozole (ARIMIDEX) 1 MG tablet Take 1 tablet (1 mg total) by mouth daily. 30 tablet 2   No current facility-administered medications for this visit.     Review of Systems  Constitutional: Negative.  Negative for chills, diaphoresis, fever, malaise/fatigue and weight loss.  HENT: Positive for hearing loss.   Eyes: Negative.  Negative for blurred vision, double vision, photophobia, pain, discharge and redness.  Respiratory: Negative.  Negative for cough, hemoptysis, sputum production and shortness of breath.   Cardiovascular: Negative.  Negative for chest pain, palpitations, orthopnea and claudication.  Gastrointestinal: Negative.  Negative for abdominal pain, diarrhea, heartburn, nausea and vomiting.  Genitourinary: Negative.  Negative for dysuria, flank pain, frequency, hematuria and urgency.  Musculoskeletal: Positive for joint pain. Negative for myalgias.  Skin: Negative.   Neurological: Positive for sensory change and  focal weakness. Negative for weakness and headaches.  Endo/Heme/Allergies: Negative.   Psychiatric/Behavioral: Negative.   All other systems reviewed and are negative. 14 point ROS was done and is otherwise as detailed above or in HPI   PHYSICAL EXAMINATION: ECOG PERFORMANCE STATUS: 2 - Symptomatic, <50% confined to bed   Vitals:   02/08/16 1531  BP: (!) 172/56  Pulse: (!) 56  Resp: 16  Temp: 97.6 F (36.4 C)   Filed Weights   02/08/16 1531  Weight: 110 lb (49.9 kg)     Physical Exam  Constitutional: She is oriented to person, place, and time and well-developed, well-nourished, and in no distress.  Able to get on examination table with assistance. Ambulates with use of cane  HENT:  Head: Normocephalic and atraumatic.  Mouth/Throat: Oropharynx is clear and moist.  Eyes: Conjunctivae and EOM are normal. Pupils are equal, round, and reactive to light. Right eye exhibits no discharge. Left eye exhibits no discharge. No scleral icterus.  Neck: Normal range of motion. Neck supple. No thyromegaly present.  Cardiovascular: Normal rate, regular rhythm and normal heart sounds.   Pulmonary/Chest: Effort normal and  breath sounds normal. No respiratory distress. She has no wheezes. She has no rales. She exhibits no tenderness.    Abdominal: Soft. Bowel sounds are normal. She exhibits no distension and no mass. There is no tenderness. There is no rebound and no guarding.  Musculoskeletal: She exhibits deformity. She exhibits no tenderness.  Lymphadenopathy:    She has no cervical adenopathy.  Neurological: She is alert and oriented to person, place, and time. No cranial nerve deficit. Coordination abnormal.  Limited to no ROM LUE  Skin: Skin is warm and dry.  Psychiatric: Mood and affect normal.  Nursing note and vitals reviewed.   LABORATORY DATA:  I have reviewed the data as listed Lab Results  Component Value Date   HGB 11.1 (L) 10/24/2006   HCT 34.0 (L) 10/24/2006    CMP     Component Value Date/Time   NA 140 10/24/2006 0850   K 4.1 10/24/2006 0850   CL 108 10/24/2006 0850   CO2 28 10/24/2006 0850   GLUCOSE 96 10/24/2006 0850   BUN 14 10/24/2006 0850   CREATININE 0.97 10/24/2006 0850   CALCIUM 9.8 10/24/2006 0850   GFRNONAA 55 (L) 10/24/2006 0850   GFRAA  10/24/2006 0850    >60        The eGFR has been calculated using the MDRD equation. This calculation has not been validated in all clinical     RADIOGRAPHIC STUDIES: I have personally reviewed the radiological images as listed and agreed with the findings in the report. Addendum   ADDENDUM REPORT: 01/19/2016 13:46  ADDENDUM: Pathology revealed GRADE III DUCTAL CARCINOMA IN SITU WITH CALCIFICATIONS AND NECROSIS, SMALL FOCUS OF INVASIVE DUCTAL CARCINOMA of the Right medial breast. This was found to be concordant by Dr. Dorise Bullion. Pathology results were discussed with the patient's niece, Pamella Pert, by telephone, per patient request. The patient's niece reported her aunt did well after the biopsy with tenderness at the site. Post biopsy instructions and care were reviewed and questions were answered. The patient was encouraged to call [facility] for any additional concerns. Surgical consultation has been arranged with Dr. Aviva [facility]gins at [facility] in [place], [place] on January 24, 2016. If breast conservation is being considered, further biopsies of additional calcifications should be performed to determine extent of disease.  Pathology results reported by Terie Purser, RN on 01/19/2016.   Electronically Signed   By: Dorise Bullion III M.D   On: 01/19/2016 13:46   Study Result   CLINICAL DATA:  Evaluate marker placement after biopsy  EXAM: DIAGNOSTIC RIGHT MAMMOGRAM POST STEREOTACTIC BIOPSY  COMPARISON:  Previous exam(s).  FINDINGS: Mammographic images were obtained following stereotactic  guided biopsy of right breast calcifications. The coil shaped marker is in good position.  IMPRESSION: Appropriate placement of biopsy marker  Final Assessment: Post Procedure Mammograms for Marker Placement   Electronically Signed   By: Dorise Bullion III M.D   On: 01/18/2016 10:23   PATHOLOGY    ASSESSMENT & PLAN:  R breast cancer, medial R breast Right breast small focus of invasive ductal carcinoma with DCIS, triple positive (ER=100%, PR=60%, Ki-67=5%, HER-2 positive) L sided weakness/LUE paralysis Advanced Age  The patient is here for further evaluation and discussion of newly diagnosed triple positive right breast cancer. Imaging suggests area of concern spans a possible area of 5.1 cm, exact extent of disease is currently unknown.  Area behind the nipple has not been biopsied. Patient is not interested  in surgery. Biopsy showed a small focus of invasion, however I explained to the patient and her family that we cannot be certain of the extent of invasive disease at this point.   We spent time today in consultation discussing types of breast cancer. She was provided with reading information from the NCCN and also given our patient navigation book about breast cancer.  I spent time discussing ER positivity; ie. The significance of these markers and treatments used for ER + tumors. We discussed endocrine therapy in some detail. I addressed her Her-2 status.  The patient is not interested in having surgery at her age at this time. She notes that she is only interested in a pill. I expressed my concern about the HER 2 positive nature of her disease however, we can start her on arimidex with close observation. A breast exam was performed today and documented above. We can repeat mammography after the holidays and another breast exam. If progression is noted I will encourage her to consider surgery. She is simply not willing at this point. If long term AI therapy seems viable, she  will need a DEXA.  I have written her a prescription for Arimidex. We discussed the potential side effects with this medication. ([place])  I answered her questions and advised her to contact Anderson Malta our patient navigator if she has needs prior to her next visit.  She will return for follow up and repeat breast examination in 6 weeks.   ORDERS PLACED FOR THIS ENCOUNTER: Orders Placed This Encounter  Procedures  . CBC with Differential  . Comprehensive metabolic panel    MEDICATIONS PRESCRIBED THIS ENCOUNTER: Meds ordered this encounter  Medications  . amLODipine (NORVASC) 5 MG tablet  . baclofen (LIORESAL) 10 MG tablet  . lisinopril-hydrochlorothiazide (PRINZIDE,ZESTORETIC) 20-25 MG tablet  . anastrozole (ARIMIDEX) 1 MG tablet    Sig: Take 1 tablet (1 mg total) by mouth daily.    Dispense:  30 tablet    Refill:  2    All questions were answered. The patient knows to call the clinic with any problems, questions or concerns.   This document serves as a record of services personally performed by Ancil Linsey, MD. It was created on her behalf by Arlyce Harman, a trained medical scribe. The creation of this record is based on the scribe's personal observations and the provider's statements to them. This document has been checked and approved by the attending provider.  I have reviewed the above documentation for accuracy and completeness and I agree with the above.  This note was electronically signed.    Molli Hazard, MD  02/12/2016 7:41 PM

## 2016-02-12 ENCOUNTER — Encounter (HOSPITAL_COMMUNITY): Payer: Self-pay | Admitting: Hematology & Oncology

## 2016-02-12 DIAGNOSIS — C50911 Malignant neoplasm of unspecified site of right female breast: Secondary | ICD-10-CM

## 2016-02-12 HISTORY — DX: Malignant neoplasm of unspecified site of right female breast: C50.911

## 2016-03-21 ENCOUNTER — Encounter (HOSPITAL_COMMUNITY): Payer: Self-pay | Admitting: Hematology & Oncology

## 2016-03-21 ENCOUNTER — Encounter (HOSPITAL_COMMUNITY): Payer: Medicare Other

## 2016-03-21 ENCOUNTER — Encounter (HOSPITAL_COMMUNITY): Payer: Medicare Other | Attending: Hematology & Oncology | Admitting: Oncology

## 2016-03-21 DIAGNOSIS — C50911 Malignant neoplasm of unspecified site of right female breast: Secondary | ICD-10-CM

## 2016-03-21 DIAGNOSIS — Z79811 Long term (current) use of aromatase inhibitors: Secondary | ICD-10-CM | POA: Diagnosis not present

## 2016-03-21 DIAGNOSIS — Z170 Estrogen receptor positive status [ER+]: Secondary | ICD-10-CM

## 2016-03-21 LAB — COMPREHENSIVE METABOLIC PANEL
ALT: 26 U/L (ref 14–54)
ANION GAP: 6 (ref 5–15)
AST: 26 U/L (ref 15–41)
Albumin: 4 g/dL (ref 3.5–5.0)
Alkaline Phosphatase: 77 U/L (ref 38–126)
BUN: 29 mg/dL — ABNORMAL HIGH (ref 6–20)
CHLORIDE: 104 mmol/L (ref 101–111)
CO2: 29 mmol/L (ref 22–32)
Calcium: 9.9 mg/dL (ref 8.9–10.3)
Creatinine, Ser: 1.35 mg/dL — ABNORMAL HIGH (ref 0.44–1.00)
GFR, EST AFRICAN AMERICAN: 39 mL/min — AB (ref >60)
GFR, EST NON AFRICAN AMERICAN: 33 mL/min — AB (ref >60)
Glucose, Bld: 85 mg/dL (ref 65–99)
POTASSIUM: 4 mmol/L (ref 3.5–5.1)
Sodium: 139 mmol/L (ref 135–145)
Total Bilirubin: 0.6 mg/dL (ref 0.3–1.2)
Total Protein: 7.4 g/dL (ref 6.5–8.1)

## 2016-03-21 LAB — CBC WITH DIFFERENTIAL/PLATELET
BASOS ABS: 0 10*3/uL (ref 0.0–0.1)
Basophils Relative: 0 %
EOS PCT: 8 %
Eosinophils Absolute: 0.4 10*3/uL (ref 0.0–0.7)
HCT: 32.9 % — ABNORMAL LOW (ref 36.0–46.0)
Hemoglobin: 10.5 g/dL — ABNORMAL LOW (ref 12.0–15.0)
LYMPHS ABS: 2.1 10*3/uL (ref 0.7–4.0)
LYMPHS PCT: 45 %
MCH: 27.6 pg (ref 26.0–34.0)
MCHC: 31.9 g/dL (ref 30.0–36.0)
MCV: 86.6 fL (ref 78.0–100.0)
MONO ABS: 0.5 10*3/uL (ref 0.1–1.0)
Monocytes Relative: 10 %
Neutro Abs: 1.7 10*3/uL (ref 1.7–7.7)
Neutrophils Relative %: 37 %
PLATELETS: 161 10*3/uL (ref 150–400)
RBC: 3.8 MIL/uL — ABNORMAL LOW (ref 3.87–5.11)
RDW: 16 % — AB (ref 11.5–15.5)
WBC: 4.6 10*3/uL (ref 4.0–10.5)

## 2016-03-21 NOTE — Patient Instructions (Addendum)
Angela Higgins at [facility] Discharge Instructions  RECOMMENDATIONS MADE BY THE CONSULTANT AND ANY TEST RESULTS WILL BE SENT TO YOUR REFERRING PHYSICIAN.  You were seen today by Kirby Crigler PA-C. Return in 6 weeks for follow up and labs.    Thank you for choosing [place] at [facility] to provide your oncology and hematology care.  To afford each patient quality time with our provider, please arrive at least 15 minutes before your scheduled appointment time.   Beginning January 23rd 2017 lab work for the [facility] will be done in the  Main lab at [facility] on 1st floor. If you have a lab appointment with the [facility] please come in thru the  Main Entrance and check in at the main information desk  You need to re-schedule your appointment should you arrive 10 or more minutes late.  We strive to give you quality time with our providers, and arriving late affects you and other patients whose appointments are after yours.  Also, if you no show three or more times for appointments you may be dismissed from the clinic at the providers discretion.     Again, thank you for choosing [facility].  Our hope is that these requests will decrease the amount of time that you wait before being seen by our physicians.       _____________________________________________________________  Should you have questions after your visit to [facility], please contact our office at [phone] between the hours of 8:30 a.m. and 4:30 p.m.  Voicemails left after 4:30 p.m. will not be returned until the following business day.  For prescription refill requests, have your pharmacy contact our office.         Resources For Cancer Patients and their Caregivers ? American Cancer Society: Can assist with transportation, wigs, general needs, runs Look Good Feel Better.        984-475-0157 ? Cancer Care: Provides financial assistance,  online support groups, medication/co-pay assistance.  1-800-813-HOPE 807-080-8592) ? [facility] Assists [place] cancer patients and their families through emotional , educational and financial support.  (412)451-0112 ? [place] DSS Where to apply for food stamps, Medicaid and utility assistance. 351-556-4070 ? [facility]: Transportation to medical appointments. 715-784-8404 ? Social Security Administration: May apply for disability if have a Stage IV cancer. 818 255 8060 9148117032 ? [employer], Disability and Transit Services: Assists with nutrition, care and transit needs. [place] Support Programs: @10RELATIVEDAYS @ > Cancer Support Group  2nd Tuesday of the month 1pm-2pm, Journey Room  > Creative Journey  3rd Tuesday of the month 1130am-1pm, Journey Room  > Look Good Feel Better  1st Wednesday of the month 10am-12 noon, Journey Room (Call [facility] to register [phone])

## 2016-03-21 NOTE — Progress Notes (Signed)
No primary care provider on file. No primary provider on file.  Hormone receptor positive breast cancer, right [facility])  CURRENT THERAPY: Arimidex daily beginning on 02/08/2016  INTERVAL HISTORY: Angela Higgins 82 y.o. female returns for followup of newly diagnosed ER+/HER2+ right breast cancer. Started Arimidex on 02/08/2016 after refusing HER2 targeted therapy and surgery.    Hormone receptor positive breast cancer, right ([place])   01/04/2016 Mammogram    Screening mammogram, calcifications in R breast      01/12/2016 Mammogram    Diagnostic mammogram There are coarse and heterogeneous calcifications in the inferior medial right breast spanning 5.1 cm. There are some scattered similar-appearing calcifications behind the nipple at a posterior depth as well      01/18/2016 Mammogram    Mammographic images were obtained following stereotactic guided biopsy of right breast calcifications. The coil shaped marker is in good position.  IMPRESSION: Appropriate placement of biopsy marker      01/18/2016 Initial Biopsy    Stereotactic-guided biopsy of right breast calcifications. No apparent complications      1/54/0086 Pathology Results    Breast, right, needle core biopsy, medial SMALL FOCUS OF INVASIVE DUCTAL CARCINOMA, DUCTAL CARCINOMA IN SITU WITH CALCIFICATIONS AND NECROSIS, GRADE 3 Estrogen Receptor: 100%, POSITIVE, STRONG STAINING INTENSITY Progesterone Receptor: 60%, POSITIVE, STRONG STAINING INTENSITY Proliferation Marker Ki67: 5% HER2 - **POSITIVE** RATIO OF HER2/CEP17 SIGNALS 2.60 AVERAGE HER2 COPY NUMBER PER CELL 3.90      02/08/2016 -  Anti-estrogen oral therapy         She is tolerating Arimidex well.  She denies any hot flashes, new/worse arthralgias or myalgias.  She reports compliance to this medication.  Review of Systems  Constitutional: Negative.  Negative for chills and fever.  HENT: Negative.   Eyes: Negative.   Respiratory: Negative.    Cardiovascular: Negative.   Gastrointestinal: Negative.  Negative for nausea and vomiting.  Genitourinary: Negative.   Musculoskeletal: Negative.   Skin: Negative.  Negative for itching and rash.  Neurological: Negative.   Endo/Heme/Allergies: Negative.   Psychiatric/Behavioral: Negative.     Past Medical History:  Diagnosis Date  . Breast cancer ([place])   . Hormone receptor positive breast cancer, right ([place]) 02/12/2016  . Hypertension   . Paralysis [facility])     Past Surgical History:  Procedure Laterality Date  . APPENDECTOMY    . HERNIA REPAIR      History reviewed. No pertinent family history.  Social History   Social History  . Marital status: Single    Spouse name: N/A  . Number of children: N/A  . Years of education: N/A   Social History Main Topics  . Smoking status: Never Smoker  . Smokeless tobacco: Never Used  . Alcohol use No  . Drug use: No  . Sexual activity: No   Other Topics Concern  . None   Social History Narrative  . None     PHYSICAL EXAMINATION  ECOG PERFORMANCE STATUS: 2 - Symptomatic, <50% confined to bed  Vitals:   03/21/16 1157  BP: (!) 146/66  Pulse: (!) 42  Resp: 20  Temp: 98.8 F (37.1 C)    GENERAL:alert, no distress, well developed, comfortable, cooperative, smiling and in wheelchair, accompanied by friends/family. SKIN: skin color, texture, turgor are normal, no rashes or significant lesions HEAD: Normocephalic, No masses, lesions, tenderness or abnormalities EYES: normal, EOMI, Conjunctiva are pink and non-injected EARS: External ears normal OROPHARYNX:lips, buccal mucosa, and tongue normal and mucous  membranes are moist  NECK: supple, no adenopathy, thyroid normal size, non-tender, without nodularity, trachea midline LYMPH:  no palpable lymphadenopathy BREAST:breasts appear normal, no suspicious masses, no skin or nipple changes or axillary nodes LUNGS: clear to auscultation and percussion HEART: regular rate &  rhythm, no murmurs and no gallops ABDOMEN:abdomen soft and normal bowel sounds BACK: Back symmetric, no curvature. EXTREMITIES:less then 2 second capillary refill, no joint deformities, effusion, or inflammation, no skin discoloration, no cyanosis  NEURO: alert & oriented x 3 with fluent speech, no focal motor/sensory deficits, in wheelchair.   LABORATORY DATA: CBC    Component Value Date/Time   WBC 4.6 03/21/2016 1109   RBC 3.80 (L) 03/21/2016 1109   HGB 10.5 (L) 03/21/2016 1109   HCT 32.9 (L) 03/21/2016 1109   PLT 161 03/21/2016 1109   MCV 86.6 03/21/2016 1109   MCH 27.6 03/21/2016 1109   MCHC 31.9 03/21/2016 1109   RDW 16.0 (H) 03/21/2016 1109   LYMPHSABS 2.1 03/21/2016 1109   MONOABS 0.5 03/21/2016 1109   EOSABS 0.4 03/21/2016 1109   BASOSABS 0.0 03/21/2016 1109      Chemistry      Component Value Date/Time   NA 139 03/21/2016 1109   K 4.0 03/21/2016 1109   CL 104 03/21/2016 1109   CO2 29 03/21/2016 1109   BUN 29 (H) 03/21/2016 1109   CREATININE 1.35 (H) 03/21/2016 1109      Component Value Date/Time   CALCIUM 9.9 03/21/2016 1109   ALKPHOS 77 03/21/2016 1109   AST 26 03/21/2016 1109   ALT 26 03/21/2016 1109   BILITOT 0.6 03/21/2016 1109        PENDING LABS:   RADIOGRAPHIC STUDIES:  No results found.   PATHOLOGY:    ASSESSMENT AND PLAN:  Hormone receptor positive breast cancer, right ([place]) Newly diagnosed ER+/HER2+ right breast cancer. Started Arimidex on 02/08/2016 after refusing HER2 targeted therapy and surgery.  Oncology history is updated.  Labs today: CBC diff, CMET.  I personally reviewed and went over laboratory results with the patient.  The results are noted within this dictation.  Continue Arimidex.  She continues to refuse additional therapy including targeted HER2 therapy and surgery.  She wishes to continue to pursue AI therapy.  Labs in 6 weeks: CBC diff, CMET.  Return in 6 weeks for follow-up.   ORDERS PLACED FOR THIS  ENCOUNTER: No orders of the defined types were placed in this encounter.   MEDICATIONS PRESCRIBED THIS ENCOUNTER: No orders of the defined types were placed in this encounter.   THERAPY PLAN:  Continue Arimidex daily.  All questions were answered. The patient knows to call the clinic with any problems, questions or concerns. We can certainly see the patient much sooner if necessary.  Patient and plan discussed with Dr. Ancil Linsey and she is in agreement with the aforementioned.   This note is electronically signed by: Doy Mince 03/21/2016 3:48 PM

## 2016-03-21 NOTE — Assessment & Plan Note (Signed)
Newly diagnosed ER+/HER2+ right breast cancer. Started Arimidex on 02/08/2016 after refusing HER2 targeted therapy and surgery.  Oncology history is updated.  Labs today: CBC diff, CMET.  I personally reviewed and went over laboratory results with the patient.  The results are noted within this dictation.  Continue Arimidex.  She continues to refuse additional therapy including targeted HER2 therapy and surgery.  She wishes to continue to pursue AI therapy.  Labs in 6 weeks: CBC diff, CMET.  Return in 6 weeks for follow-up.

## 2016-04-03 DIAGNOSIS — G8190 Hemiplegia, unspecified affecting unspecified side: Secondary | ICD-10-CM | POA: Diagnosis not present

## 2016-04-03 DIAGNOSIS — I10 Essential (primary) hypertension: Secondary | ICD-10-CM | POA: Diagnosis not present

## 2016-04-03 DIAGNOSIS — M159 Polyosteoarthritis, unspecified: Secondary | ICD-10-CM | POA: Diagnosis not present

## 2016-04-09 ENCOUNTER — Other Ambulatory Visit (HOSPITAL_COMMUNITY): Payer: Self-pay | Admitting: Hematology & Oncology

## 2016-04-09 DIAGNOSIS — C50911 Malignant neoplasm of unspecified site of right female breast: Secondary | ICD-10-CM

## 2016-05-01 ENCOUNTER — Other Ambulatory Visit (HOSPITAL_COMMUNITY): Payer: Self-pay | Admitting: *Deleted

## 2016-05-01 DIAGNOSIS — C50911 Malignant neoplasm of unspecified site of right female breast: Secondary | ICD-10-CM

## 2016-05-04 ENCOUNTER — Encounter (HOSPITAL_COMMUNITY): Payer: Medicare Other

## 2016-05-04 ENCOUNTER — Encounter (HOSPITAL_COMMUNITY): Payer: Medicare Other | Attending: Oncology | Admitting: Oncology

## 2016-05-04 DIAGNOSIS — C50911 Malignant neoplasm of unspecified site of right female breast: Secondary | ICD-10-CM

## 2016-05-04 DIAGNOSIS — Z170 Estrogen receptor positive status [ER+]: Secondary | ICD-10-CM | POA: Diagnosis not present

## 2016-05-04 DIAGNOSIS — Z79811 Long term (current) use of aromatase inhibitors: Secondary | ICD-10-CM

## 2016-05-04 LAB — COMPREHENSIVE METABOLIC PANEL
ALT: 18 U/L (ref 14–54)
AST: 29 U/L (ref 15–41)
Albumin: 3.9 g/dL (ref 3.5–5.0)
Alkaline Phosphatase: 71 U/L (ref 38–126)
Anion gap: 5 (ref 5–15)
BUN: 26 mg/dL — AB (ref 6–20)
CALCIUM: 9.5 mg/dL (ref 8.9–10.3)
CO2: 29 mmol/L (ref 22–32)
CREATININE: 1.16 mg/dL — AB (ref 0.44–1.00)
Chloride: 106 mmol/L (ref 101–111)
GFR, EST AFRICAN AMERICAN: 46 mL/min — AB (ref >60)
GFR, EST NON AFRICAN AMERICAN: 40 mL/min — AB (ref >60)
Glucose, Bld: 93 mg/dL (ref 65–99)
Potassium: 4 mmol/L (ref 3.5–5.1)
Sodium: 140 mmol/L (ref 135–145)
Total Bilirubin: 0.5 mg/dL (ref 0.3–1.2)
Total Protein: 7.3 g/dL (ref 6.5–8.1)

## 2016-05-04 LAB — CBC WITH DIFFERENTIAL/PLATELET
BASOS ABS: 0 10*3/uL (ref 0.0–0.1)
Basophils Relative: 1 %
Eosinophils Absolute: 0.4 10*3/uL (ref 0.0–0.7)
Eosinophils Relative: 10 %
HEMATOCRIT: 34.3 % — AB (ref 36.0–46.0)
HEMOGLOBIN: 11 g/dL — AB (ref 12.0–15.0)
LYMPHS ABS: 2 10*3/uL (ref 0.7–4.0)
LYMPHS PCT: 45 %
MCH: 28.8 pg (ref 26.0–34.0)
MCHC: 32.1 g/dL (ref 30.0–36.0)
MCV: 89.8 fL (ref 78.0–100.0)
Monocytes Absolute: 0.4 10*3/uL (ref 0.1–1.0)
Monocytes Relative: 10 %
NEUTROS ABS: 1.5 10*3/uL — AB (ref 1.7–7.7)
NEUTROS PCT: 34 %
Platelets: 145 10*3/uL — ABNORMAL LOW (ref 150–400)
RBC: 3.82 MIL/uL — AB (ref 3.87–5.11)
RDW: 16.3 % — ABNORMAL HIGH (ref 11.5–15.5)
WBC: 4.3 10*3/uL (ref 4.0–10.5)

## 2016-05-04 NOTE — Patient Instructions (Addendum)
[place] at [facility] Discharge Instructions  RECOMMENDATIONS MADE BY THE CONSULTANT AND ANY TEST RESULTS WILL BE SENT TO YOUR REFERRING PHYSICIAN.  Diagnostic Mammogram on Right breast in 4 to 6 weeks  Labs in 6 weeks  Return 6 weeks for follow up to review Mammogram results  Thank you for choosing [place] at [facility] to provide your oncology and hematology care.  To afford each patient quality time with our provider, please arrive at least 15 minutes before your scheduled appointment time.    If you have a lab appointment with the [facility] please come in thru the  Main Entrance and check in at the main information desk  You need to re-schedule your appointment should you arrive 10 or more minutes late.  We strive to give you quality time with our providers, and arriving late affects you and other patients whose appointments are after yours.  Also, if you no show three or more times for appointments you may be dismissed from the clinic at the providers discretion.     Again, thank you for choosing [facility].  Our hope is that these requests will decrease the amount of time that you wait before being seen by our physicians.       _____________________________________________________________  Should you have questions after your visit to [facility], please contact our office at [phone] between the hours of 8:30 a.m. and 4:30 p.m.  Voicemails left after 4:30 p.m. will not be returned until the following business day.  For prescription refill requests, have your pharmacy contact our office.       Resources For Cancer Patients and their Caregivers ? American Cancer Society: Can assist with transportation, wigs, general needs, runs Look Good Feel Better.        579-047-2583 ? Cancer Care: Provides financial assistance, online support groups, medication/co-pay assistance.  1-800-813-HOPE  (713)054-2342) ? [facility] Assists [place] cancer patients and their families through emotional , educational and financial support.  ([phone] ? [place] DSS Where to apply for food stamps, Medicaid and utility assistance. 937-203-0637 ? RCATS: Transportation to medical appointments. 6175414670 ? Social Security Administration: May apply for disability if have a Stage IV cancer. 6308715498 9027761283 ? [employer], Disability and Transit Services: Assists with nutrition, care and transit needs. [place] Support Programs: @10RELATIVEDAYS @ > Cancer Support Group  2nd Tuesday of the month 1pm-2pm, Journey Room  > Creative Journey  3rd Tuesday of the month 1130am-1pm, Journey Room  > Look Good Feel Better  1st Wednesday of the month 10am-12 noon, Journey Room (Call [place] to register [phone])

## 2016-05-04 NOTE — Assessment & Plan Note (Addendum)
ER+/HER2+ right breast cancer. Started Arimidex on 02/08/2016 after refusing HER2 targeted therapy and surgery.  Oncology history is updated.  Labs today: CBC diff, CMET.  I personally reviewed and went over laboratory results with the patient.  The results are noted within this dictation.  Continue Arimidex.  She continues to refuse additional therapy including targeted HER2 therapy and surgery.  She wishes to continue to pursue AI therapy.  Mammogram (diagnostic) with [place] in 4-6 weeks to evaluate response to therapy.  Labs in 4-6 weeks: CBC diff, CMET.  Return in 4-6 weeks for follow-up.

## 2016-05-04 NOTE — Progress Notes (Signed)
No primary care provider on file. No primary provider on file.  Hormone receptor positive breast cancer, right ([place]) - Plan: MM DIAG BREAST TOMO UNI RIGHT, US BREAST LTD UNI RIGHT INC AXILLA  CURRENT THERAPY: Arimidex daily beginning on 02/08/2016  INTERVAL HISTORY: Angela Higgins 83 y.o. female returns for followup of newly diagnosed ER+/HER2+ right breast cancer. Started Arimidex on 02/08/2016 after refusing HER2 targeted therapy and surgery.    Hormone receptor positive breast cancer, right ([place])   01/04/2016 Mammogram    Screening mammogram, calcifications in R breast      01/12/2016 Mammogram    Diagnostic mammogram There are coarse and heterogeneous calcifications in the inferior medial right breast spanning 5.1 cm. There are some scattered similar-appearing calcifications behind the nipple at a posterior depth as well      01/18/2016 Mammogram    Mammographic images were obtained following stereotactic guided biopsy of right breast calcifications. The coil shaped marker is in good position.  IMPRESSION: Appropriate placement of biopsy marker      01/18/2016 Initial Biopsy    Stereotactic-guided biopsy of right breast calcifications. 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  She denies any hot flashes, new/worse arthralgias or myalgias.  She reports compliance to this medication.  Review of Systems  Constitutional: Negative.  Negative for chills  and fever.  HENT: Negative.   Eyes: Negative.   Respiratory: Negative.   Cardiovascular: Negative.   Gastrointestinal: Negative.  Negative for nausea and vomiting.  Genitourinary: Negative.   Musculoskeletal: Negative.   Skin: Negative.  Negative for itching and rash.  Neurological: Negative.   Endo/Heme/Allergies: Negative.   Psychiatric/Behavioral: Negative.     Past Medical History:  Diagnosis Date  . Breast cancer ([place])   . Hormone receptor positive breast cancer, right ([place]) 02/12/2016  . Hypertension   . Paralysis [facility])     Past Surgical History:  Procedure Laterality Date  . APPENDECTOMY    . HERNIA REPAIR      No family history on file.  Social History   Social History  . Marital status: Single    Spouse name: N/A  . Number of children: N/A  . Years of education: N/A   Social History Main Topics  . Smoking status: Never Smoker  . Smokeless tobacco: Never Used  . Alcohol use No  . Drug use: No  . Sexual activity: No   Other Topics Concern  . Not on file   Social History Narrative  . No narrative on file     PHYSICAL EXAMINATION  ECOG PERFORMANCE STATUS: 2 - Symptomatic, <50% confined to bed  Vitals:   05/04/16 1048  BP: (!) 147/42  Pulse: (!) 43  Resp: 18  Temp: 97.6 F (36.4 C)    GENERAL:alert, no distress, well developed, comfortable, cooperative, smiling and in wheelchair, accompanied by friends/family. SKIN: skin color, texture, turgor are normal, no rashes or significant lesions HEAD: Normocephalic, No masses, lesions, tenderness or abnormalities EYES:  normal, EOMI, Conjunctiva are pink and non-injected EARS: External ears normal OROPHARYNX:lips, buccal mucosa, and tongue normal and mucous membranes are moist  NECK: supple, no adenopathy, thyroid normal size, non-tender, without nodularity, trachea midline LYMPH:  no palpable lymphadenopathy BREAST: right breast examined demonstrates a palpable 2-3 cm mass in the inferomedial  aspect of breast just lateral to areola. LUNGS: clear to auscultation and percussion HEART: regular rate & rhythm, no murmurs and no gallops ABDOMEN:abdomen soft and normal bowel sounds BACK: Back symmetric, no curvature. EXTREMITIES:less then 2 second capillary refill, no joint deformities, effusion, or inflammation, no skin discoloration, no cyanosis  NEURO: alert & oriented x 3 with fluent speech, no focal motor/sensory deficits, in wheelchair.   LABORATORY DATA: CBC    Component Value Date/Time   WBC 4.3 05/04/2016 1030   RBC 3.82 (L) 05/04/2016 1030   HGB 11.0 (L) 05/04/2016 1030   HCT 34.3 (L) 05/04/2016 1030   PLT 145 (L) 05/04/2016 1030   MCV 89.8 05/04/2016 1030   MCH 28.8 05/04/2016 1030   MCHC 32.1 05/04/2016 1030   RDW 16.3 (H) 05/04/2016 1030   LYMPHSABS 2.0 05/04/2016 1030   MONOABS 0.4 05/04/2016 1030   EOSABS 0.4 05/04/2016 1030   BASOSABS 0.0 05/04/2016 1030      Chemistry      Component Value Date/Time   NA 140 05/04/2016 1030   K 4.0 05/04/2016 1030   CL 106 05/04/2016 1030   CO2 29 05/04/2016 1030   BUN 26 (H) 05/04/2016 1030   CREATININE 1.16 (H) 05/04/2016 1030      Component Value Date/Time   CALCIUM 9.5 05/04/2016 1030   ALKPHOS 71 05/04/2016 1030   AST 29 05/04/2016 1030   ALT 18 05/04/2016 1030   BILITOT 0.5 05/04/2016 1030        PENDING LABS:   RADIOGRAPHIC STUDIES:  No results found.   PATHOLOGY:    ASSESSMENT AND PLAN:  Hormone receptor positive breast cancer, right ([facility]) ER+/HER2+ right breast cancer. Started Arimidex on 02/08/2016 after refusing HER2 targeted therapy and surgery.  Oncology history is updated.  Labs today: CBC diff, CMET.  I personally reviewed and went over laboratory results with the patient.  The results are noted within this dictation.  Continue Arimidex.  She continues to refuse additional therapy including targeted HER2 therapy and surgery.  She wishes to continue to pursue AI  therapy.  Mammogram (diagnostic) with [place] in 4-6 weeks to evaluate response to therapy.  Labs in 4-6 weeks: CBC diff, CMET.  Return in 4-6 weeks for follow-up.   ORDERS PLACED FOR THIS ENCOUNTER: Orders Placed This Encounter  Procedures  . MM DIAG BREAST TOMO UNI RIGHT  . US BREAST LTD UNI RIGHT INC AXILLA    MEDICATIONS PRESCRIBED THIS ENCOUNTER: No orders of the defined types were placed in this encounter.   THERAPY PLAN:  Continue Arimidex daily.  All questions were answered. The patient knows to call the clinic with any problems, questions or concerns. We can certainly see the patient much sooner if necessary.  Patient and plan discussed with Dr. Ancil Linsey and she is in agreement with the aforementioned.   This note is electronically signed by: Doy Mince 05/04/2016 11:43 AM

## 2016-05-31 DIAGNOSIS — M129 Arthropathy, unspecified: Secondary | ICD-10-CM | POA: Diagnosis not present

## 2016-06-01 DIAGNOSIS — M129 Arthropathy, unspecified: Secondary | ICD-10-CM | POA: Diagnosis not present

## 2016-06-02 DIAGNOSIS — M129 Arthropathy, unspecified: Secondary | ICD-10-CM | POA: Diagnosis not present

## 2016-06-03 DIAGNOSIS — M129 Arthropathy, unspecified: Secondary | ICD-10-CM | POA: Diagnosis not present

## 2016-06-04 DIAGNOSIS — M129 Arthropathy, unspecified: Secondary | ICD-10-CM | POA: Diagnosis not present

## 2016-06-05 ENCOUNTER — Ambulatory Visit (HOSPITAL_COMMUNITY)
Admission: RE | Admit: 2016-06-05 | Discharge: 2016-06-05 | Disposition: A | Discharge status: Home / Self Care | Payer: Medicare HMO | Source: Ambulatory Visit | Attending: Oncology | Admitting: Oncology

## 2016-06-05 ENCOUNTER — Other Ambulatory Visit (HOSPITAL_COMMUNITY): Payer: Self-pay | Admitting: Hematology & Oncology

## 2016-06-05 DIAGNOSIS — N6489 Other specified disorders of breast: Secondary | ICD-10-CM | POA: Diagnosis not present

## 2016-06-05 DIAGNOSIS — C50911 Malignant neoplasm of unspecified site of right female breast: Secondary | ICD-10-CM

## 2016-06-05 DIAGNOSIS — M129 Arthropathy, unspecified: Secondary | ICD-10-CM | POA: Diagnosis not present

## 2016-06-05 DIAGNOSIS — Z170 Estrogen receptor positive status [ER+]: Secondary | ICD-10-CM | POA: Insufficient documentation

## 2016-06-05 DIAGNOSIS — R921 Mammographic calcification found on diagnostic imaging of breast: Secondary | ICD-10-CM | POA: Diagnosis not present

## 2016-06-06 DIAGNOSIS — M129 Arthropathy, unspecified: Secondary | ICD-10-CM | POA: Diagnosis not present

## 2016-06-07 DIAGNOSIS — M129 Arthropathy, unspecified: Secondary | ICD-10-CM | POA: Diagnosis not present

## 2016-06-08 DIAGNOSIS — M129 Arthropathy, unspecified: Secondary | ICD-10-CM | POA: Diagnosis not present

## 2016-06-09 DIAGNOSIS — M129 Arthropathy, unspecified: Secondary | ICD-10-CM | POA: Diagnosis not present

## 2016-06-10 DIAGNOSIS — M129 Arthropathy, unspecified: Secondary | ICD-10-CM | POA: Diagnosis not present

## 2016-06-11 DIAGNOSIS — M129 Arthropathy, unspecified: Secondary | ICD-10-CM | POA: Diagnosis not present

## 2016-06-12 DIAGNOSIS — M129 Arthropathy, unspecified: Secondary | ICD-10-CM | POA: Diagnosis not present

## 2016-06-13 DIAGNOSIS — M129 Arthropathy, unspecified: Secondary | ICD-10-CM | POA: Diagnosis not present

## 2016-06-14 DIAGNOSIS — M129 Arthropathy, unspecified: Secondary | ICD-10-CM | POA: Diagnosis not present

## 2016-06-15 ENCOUNTER — Encounter (HOSPITAL_COMMUNITY): Payer: Medicaid Other | Attending: Oncology | Admitting: Oncology

## 2016-06-15 ENCOUNTER — Encounter (HOSPITAL_COMMUNITY): Payer: Medicare HMO | Attending: Oncology

## 2016-06-15 VITALS — BP 144/54 | HR 54 | Temp 98.2°F | Resp 16 | Wt 119.4 lb

## 2016-06-15 DIAGNOSIS — M129 Arthropathy, unspecified: Secondary | ICD-10-CM | POA: Diagnosis not present

## 2016-06-15 DIAGNOSIS — Z170 Estrogen receptor positive status [ER+]: Secondary | ICD-10-CM

## 2016-06-15 DIAGNOSIS — C50911 Malignant neoplasm of unspecified site of right female breast: Secondary | ICD-10-CM | POA: Diagnosis not present

## 2016-06-15 DIAGNOSIS — Z79811 Long term (current) use of aromatase inhibitors: Secondary | ICD-10-CM | POA: Diagnosis not present

## 2016-06-15 LAB — COMPREHENSIVE METABOLIC PANEL
ALK PHOS: 85 U/L (ref 38–126)
ALT: 24 U/L (ref 14–54)
ANION GAP: 6 (ref 5–15)
AST: 34 U/L (ref 15–41)
Albumin: 3.5 g/dL (ref 3.5–5.0)
BUN: 26 mg/dL — ABNORMAL HIGH (ref 6–20)
CO2: 27 mmol/L (ref 22–32)
Calcium: 9.5 mg/dL (ref 8.9–10.3)
Chloride: 105 mmol/L (ref 101–111)
Creatinine, Ser: 1.19 mg/dL — ABNORMAL HIGH (ref 0.44–1.00)
GFR calc Af Amer: 45 mL/min — ABNORMAL LOW (ref >60)
GFR calc non Af Amer: 38 mL/min — ABNORMAL LOW (ref >60)
GLUCOSE: 94 mg/dL (ref 65–99)
POTASSIUM: 3.8 mmol/L (ref 3.5–5.1)
SODIUM: 138 mmol/L (ref 135–145)
TOTAL PROTEIN: 6.6 g/dL (ref 6.5–8.1)
Total Bilirubin: 0.5 mg/dL (ref 0.3–1.2)

## 2016-06-15 LAB — CBC WITH DIFFERENTIAL/PLATELET
BASOS ABS: 0 10*3/uL (ref 0.0–0.1)
Basophils Relative: 1 %
EOS PCT: 10 %
Eosinophils Absolute: 0.4 10*3/uL (ref 0.0–0.7)
HCT: 32.3 % — ABNORMAL LOW (ref 36.0–46.0)
HEMOGLOBIN: 10.6 g/dL — AB (ref 12.0–15.0)
LYMPHS ABS: 1.9 10*3/uL (ref 0.7–4.0)
LYMPHS PCT: 44 %
MCH: 28.6 pg (ref 26.0–34.0)
MCHC: 32.8 g/dL (ref 30.0–36.0)
MCV: 87.1 fL (ref 78.0–100.0)
Monocytes Absolute: 0.3 10*3/uL (ref 0.1–1.0)
Monocytes Relative: 8 %
Neutro Abs: 1.6 10*3/uL — ABNORMAL LOW (ref 1.7–7.7)
Neutrophils Relative %: 37 %
PLATELETS: 197 10*3/uL (ref 150–400)
RBC: 3.71 MIL/uL — ABNORMAL LOW (ref 3.87–5.11)
RDW: 16.7 % — ABNORMAL HIGH (ref 11.5–15.5)
WBC: 4.2 10*3/uL (ref 4.0–10.5)

## 2016-06-15 NOTE — Patient Instructions (Addendum)
[place] at [facility] Discharge Instructions  RECOMMENDATIONS MADE BY THE CONSULTANT AND ANY TEST RESULTS WILL BE SENT TO YOUR REFERRING PHYSICIAN.  You were seen today by Dr. Barron Schmid We will schedule you for your mammogram Follow up in 3 months with labs See Amy up front for appointments   Thank you for choosing [place] at [facility] to provide your oncology and hematology care.  To afford each patient quality time with our provider, please arrive at least 15 minutes before your scheduled appointment time.    If you have a lab appointment with the [facility] please come in thru the  Main Entrance and check in at the main information desk  You need to re-schedule your appointment should you arrive 10 or more minutes late.  We strive to give you quality time with our providers, and arriving late affects you and other patients whose appointments are after yours.  Also, if you no show three or more times for appointments you may be dismissed from the clinic at the providers discretion.     Again, thank you for choosing [facility].  Our hope is that these requests will decrease the amount of time that you wait before being seen by our physicians.       _____________________________________________________________  Should you have questions after your visit to [facility], please contact our office at [phone] between the hours of 8:30 a.m. and 4:30 p.m.  Voicemails left after 4:30 p.m. will not be returned until the following business day.  For prescription refill requests, have your pharmacy contact our office.       Resources For Cancer Patients and their Caregivers ? American Cancer Society: Can assist with transportation, wigs, general needs, runs Look Good Feel Better.        7855042633 ? Cancer Care: Provides financial assistance, online support groups, medication/co-pay assistance.   1-800-813-HOPE (864) 430-0868) ? [facility] Assists [place] cancer patients and their families through emotional , educational and financial support.  503-216-6457 ? [place] DSS Where to apply for food stamps, Medicaid and utility assistance. 450-289-9150 ? RCATS: Transportation to medical appointments. ([phone]) 516-5796 ? Social Security Administration: May apply for disability if have a Stage IV cancer. 501 397 2977 434-559-0402 ? [employer], Disability and Transit Services: Assists with nutrition, care and transit needs. [place] Support Programs: @10RELATIVEDAYS @ > Cancer Support Group  2nd Tuesday of the month 1pm-2pm, Journey Room  > Creative Journey  3rd Tuesday of the month 1130am-1pm, Journey Room  > Look Good Feel Better  1st Wednesday of the month 10am-12 noon, Journey Room (Call [place] to register [phone])

## 2016-06-15 NOTE — Progress Notes (Signed)
FANTA,TESFAYE, MD No primary provider on file.  No diagnosis found.  CURRENT THERAPY: Arimidex daily beginning on 02/08/2016  INTERVAL HISTORY: Angela Higgins 83 y.o. female returns for followup of ER+/HER2+ right breast cancer. Started Arimidex on 02/08/2016 after refusing HER2 targeted therapy and surgery.    Hormone receptor positive breast cancer, right ([place])   01/04/2016 Mammogram    Screening mammogram, calcifications in R breast      01/12/2016 Mammogram    Diagnostic mammogram There are coarse and heterogeneous calcifications in the inferior medial right breast spanning 5.1 cm. There are some scattered similar-appearing calcifications behind the nipple at a posterior depth as well      01/18/2016 Mammogram    Mammographic images were obtained following stereotactic guided biopsy of right breast calcifications. The coil shaped marker is in good position.  IMPRESSION: Appropriate placement of biopsy marker      01/18/2016 Initial Biopsy    Stereotactic-guided biopsy of right breast calcifications. No apparent complications      1/66/0630 Pathology Results    Breast, right, needle core biopsy, medial SMALL FOCUS OF INVASIVE DUCTAL CARCINOMA, DUCTAL CARCINOMA IN SITU WITH CALCIFICATIONS AND NECROSIS, GRADE 3 Estrogen Receptor: 100%, POSITIVE, STRONG STAINING INTENSITY Progesterone Receptor: 60%, POSITIVE, STRONG STAINING INTENSITY Proliferation Marker Ki67: 5% HER2 - **POSITIVE** RATIO OF HER2/CEP17 SIGNALS 2.60 AVERAGE HER2 COPY NUMBER PER CELL 3.90      02/08/2016 -  Anti-estrogen oral therapy         06/05/2016 Breast US    Diagnostic mammogram tomo uni right & US breast ltd uni right inc axilla IMPRESSION: 1. Calcifications at site of biopsy proven malignancy in the inner right breast as well as additional smaller group of calcifications in the slightly outer right breast appear overall stable. 2. New mass in the upper inner right breast at  the 2 o'clock location possibly related to post biopsy change although papilloma or additional site of malignancy cannot be excluded.      Patient returns for follow up. She is doing well overall. She continues to take Arimidex daily without issue. She denies joint pain or hot flashes. She reports bilateral leg swelling which began about 1 week ago. She does not eat much salt. She will speak with her primary care physician about this leg swelling. She denies chest pain or shortness of breath. She has been eating well.   Review of Systems  Constitutional: Negative.  Negative for chills and fever.  HENT: Negative.   Eyes: Negative.   Respiratory: Negative.  Negative for shortness of breath.   Cardiovascular: Positive for leg swelling. Negative for chest pain.  Gastrointestinal: Negative.  Negative for nausea and vomiting.  Genitourinary: Negative.   Musculoskeletal: Negative.  Negative for joint pain.  Skin: Negative.  Negative for itching and rash.  Neurological: Negative.   Endo/Heme/Allergies: Negative.   Psychiatric/Behavioral: Negative.     Past Medical History:  Diagnosis Date  . Breast cancer ([place])   . Hormone receptor positive breast cancer, right ([place]) 02/12/2016  . Hypertension   . Paralysis [facility])     Past Surgical History:  Procedure Laterality Date  . APPENDECTOMY    . HERNIA REPAIR      No family history on file.  Social History   Social History  . Marital status: Single    Spouse name: N/A  . Number of children: N/A  . Years of education: N/A   Social History Main Topics  . Smoking status:  Never Smoker  . Smokeless tobacco: Never Used  . Alcohol use No  . Drug use: No  . Sexual activity: No   Other Topics Concern  . Not on file   Social History Narrative  . No narrative on file     PHYSICAL EXAMINATION  ECOG PERFORMANCE STATUS: 2 - Symptomatic, <50% confined to bed  Vitals:   06/15/16 1144  BP: (!) 144/54  Pulse: (!) 54  Resp: 16    Temp: 98.2 F (36.8 C)    Physical Exam  Constitutional: She is oriented to person, place, and time and well-developed, well-nourished, and in no distress.  HENT:  Head: Normocephalic and atraumatic.  Mouth/Throat: Oropharynx is clear and moist.  Eyes: Conjunctivae and EOM are normal. Pupils are equal, round, and reactive to light.  Neck: Normal range of motion. Neck supple.  Cardiovascular: Normal rate, regular rhythm and normal heart sounds.   Pulmonary/Chest: Effort normal and breath sounds normal.    Abdominal: Soft. Bowel sounds are normal.  Musculoskeletal: Normal range of motion.  1+ ankle edema bilaterally  Neurological: She is alert and oriented to person, place, and time. Gait normal.  Skin: Skin is warm and dry.  Nursing note and vitals reviewed.   LABORATORY DATA: I have reviewed the data as listed. CBC    Component Value Date/Time   WBC 4.2 06/15/2016 1059   RBC 3.71 (L) 06/15/2016 1059   HGB 10.6 (L) 06/15/2016 1059   HCT 32.3 (L) 06/15/2016 1059   PLT 197 06/15/2016 1059   MCV 87.1 06/15/2016 1059   MCH 28.6 06/15/2016 1059   MCHC 32.8 06/15/2016 1059   RDW 16.7 (H) 06/15/2016 1059   LYMPHSABS 1.9 06/15/2016 1059   MONOABS 0.3 06/15/2016 1059   EOSABS 0.4 06/15/2016 1059   BASOSABS 0.0 06/15/2016 1059      Chemistry      Component Value Date/Time   NA 138 06/15/2016 1059   K 3.8 06/15/2016 1059   CL 105 06/15/2016 1059   CO2 27 06/15/2016 1059   BUN 26 (H) 06/15/2016 1059   CREATININE 1.19 (H) 06/15/2016 1059      Component Value Date/Time   CALCIUM 9.5 06/15/2016 1059   ALKPHOS 85 06/15/2016 1059   AST 34 06/15/2016 1059   ALT 24 06/15/2016 1059   BILITOT 0.5 06/15/2016 1059        PENDING LABS:   RADIOGRAPHIC STUDIES: I have personally reviewed the radiological images as listed and agreed with the findings in the report.  US Breast Ltd Uni Right Inc Axilla  Result Date: 06/05/2016 CLINICAL DATA:  83 year old female with history  of grade 3 ductal carcinoma in situ and small focus of invasive ductal carcinoma post stereotactic guided biopsy of calcifications 01/18/2016. The patient opted for medical treatment only rather than surgery and returns for short term follow-up. EXAM: 2D DIGITAL DIAGNOSTIC UNILATERAL RIGHT MAMMOGRAM WITH CAD AND ADJUNCT TOMO RIGHT BREAST ULTRASOUND COMPARISON:  Previous exam(s). ACR Breast Density Category b: There are scattered areas of fibroglandular density. FINDINGS: The coarse heterogeneous calcifications in the inner right breast at site of biopsy proven malignancy which span a distance of approximately 5 cm appear unchanged when compared to the prior exam. The coil shaped biopsy marking clip is present along the posterosuperior aspect of the calcifications. A 0.5 cm group of coarse heterogeneous calcifications in the slightly outer right breast middle depth appear unchanged. There is an oval mass with indistinct margins in the upper inner posterior right breast measuring  approximately 0.5-0.6 cm. Initially questioned possible distortion in the outer right breast appears to resolve on the additional imaging with findings compatible with overlapping fibroglandular tissue. Mammographic images were processed with CAD. Physical examination of the upper inner right breast does not reveal any palpable masses. Physical examination the outer right breast does not reveal any palpable masses. Targeted ultrasound of the outer right breast was performed. No suspicious masses or abnormalities are seen, only heterogeneous fibroglandular tissue is visualized. Targeted ultrasound of the upper inner right breast was performed demonstrating an oval circumscribed near anechoic mass containing echogenic foci at 2 o'clock 5 cm from the nipple measuring 0.5 x 0.3 x 0.5 cm. This may connect with an adjacent duct. IMPRESSION: 1. Calcifications at site of biopsy proven malignancy in the inner right breast as well as additional smaller  group of calcifications in the slightly outer right breast appear overall stable. 2. New mass in the upper inner right breast at the 2 o'clock location possibly related to post biopsy change although papilloma or additional site of malignancy cannot be excluded. RECOMMENDATION: If clinically indicated, ultrasound-guided biopsy of the mass in the right breast at the 2 o'clock location could be performed. Alternatively, given patient's advanced age and unwillingness to have additional procedures performed short-term follow-up to assess for change could also be considered. I have discussed the findings and recommendations with the patient. Results were also provided in writing at the conclusion of the visit. If applicable, a reminder letter will be sent to the patient regarding the next appointment. BI-RADS CATEGORY  4: Suspicious. Electronically Signed   By: Everlean Alstrom M.D.   On: 06/05/2016 12:27   Mm Diag Breast Tomo Uni Right  Result Date: 06/05/2016 CLINICAL DATA:  83 year old female with history of grade 3 ductal carcinoma in situ and small focus of invasive ductal carcinoma post stereotactic guided biopsy of calcifications 01/18/2016. The patient opted for medical treatment only rather than surgery and returns for short term follow-up. EXAM: 2D DIGITAL DIAGNOSTIC UNILATERAL RIGHT MAMMOGRAM WITH CAD AND ADJUNCT TOMO RIGHT BREAST ULTRASOUND COMPARISON:  Previous exam(s). ACR Breast Density Category b: There are scattered areas of fibroglandular density. FINDINGS: The coarse heterogeneous calcifications in the inner right breast at site of biopsy proven malignancy which span a distance of approximately 5 cm appear unchanged when compared to the prior exam. The coil shaped biopsy marking clip is present along the posterosuperior aspect of the calcifications. A 0.5 cm group of coarse heterogeneous calcifications in the slightly outer right breast middle depth appear unchanged. There is an oval mass with  indistinct margins in the upper inner posterior right breast measuring approximately 0.5-0.6 cm. Initially questioned possible distortion in the outer right breast appears to resolve on the additional imaging with findings compatible with overlapping fibroglandular tissue. Mammographic images were processed with CAD. Physical examination of the upper inner right breast does not reveal any palpable masses. Physical examination the outer right breast does not reveal any palpable masses. Targeted ultrasound of the outer right breast was performed. No suspicious masses or abnormalities are seen, only heterogeneous fibroglandular tissue is visualized. Targeted ultrasound of the upper inner right breast was performed demonstrating an oval circumscribed near anechoic mass containing echogenic foci at 2 o'clock 5 cm from the nipple measuring 0.5 x 0.3 x 0.5 cm. This may connect with an adjacent duct. IMPRESSION: 1. Calcifications at site of biopsy proven malignancy in the inner right breast as well as additional smaller group of calcifications in the slightly outer  right breast appear overall stable. 2. New mass in the upper inner right breast at the 2 o'clock location possibly related to post biopsy change although papilloma or additional site of malignancy cannot be excluded. RECOMMENDATION: If clinically indicated, ultrasound-guided biopsy of the mass in the right breast at the 2 o'clock location could be performed. Alternatively, given patient's advanced age and unwillingness to have additional procedures performed short-term follow-up to assess for change could also be considered. I have discussed the findings and recommendations with the patient. Results were also provided in writing at the conclusion of the visit. If applicable, a reminder letter will be sent to the patient regarding the next appointment. BI-RADS CATEGORY  4: Suspicious. Electronically Signed   By: Everlean Alstrom M.D.   On: 06/05/2016 12:27    Diagnostic mammogram tomo uni right & US breast uni right inc axilla 06/05/2016 IMPRESSION: 1. Calcifications at site of biopsy proven malignancy in the inner right breast as well as additional smaller group of calcifications in the slightly outer right breast appear overall stable.  2. New mass in the upper inner right breast at the 2 o'clock location possibly related to post biopsy change although papilloma or additional site of malignancy cannot be excluded.  PATHOLOGY:    ASSESSMENT AND PLAN:  Hormone receptor positive breast cancer, right ([facility]) ER+/HER2+ right breast cancer.   Diagnostic mammogram and right breast ultrasound performed on 06/05/2016 showed overall stability of the proven malignancy in the inner right breast. However, there was a new mass in the upper inner right breast at the 2 o'clock location. Reviewed mammogram with the patient in detail. Discussed biopsy of the new mass vs. Close surveillance. The patient would like to proceed with repeat mammogram in 3 months instead of biopsy. No masses were palpated on clinical exam today.  Diagnostic right breast mammogram ordered today to be performed in 3 months.  She will speak with her primary care physician about recent leg swelling.   Patient will return to clinic in 3 months for repeat clinical exam and review of repeat mammogram results.    ORDERS PLACED FOR THIS ENCOUNTER: No orders of the defined types were placed in this encounter.   MEDICATIONS PRESCRIBED THIS ENCOUNTER: No orders of the defined types were placed in this encounter.   THERAPY PLAN:  Continue Arimidex daily.  All questions were answered. The patient knows to call the clinic with any problems, questions or concerns. We can certainly see the patient much sooner if necessary.  This document serves as a record of services personally performed by Twana First, MD. It was created on her behalf by Arlyce Harman, a trained medical scribe.  The creation of this record is based on the scribe's personal observations and the provider's statements to them. This document has been checked and approved by the attending provider.  I have reviewed the above documentation for accuracy and completeness and I agree with the above.  This note is electronically signed by: Carlis Abbott 06/15/2016 11:45 AM

## 2016-06-16 DIAGNOSIS — M129 Arthropathy, unspecified: Secondary | ICD-10-CM | POA: Diagnosis not present

## 2016-06-17 DIAGNOSIS — M129 Arthropathy, unspecified: Secondary | ICD-10-CM | POA: Diagnosis not present

## 2016-06-18 DIAGNOSIS — M129 Arthropathy, unspecified: Secondary | ICD-10-CM | POA: Diagnosis not present

## 2016-06-19 DIAGNOSIS — M129 Arthropathy, unspecified: Secondary | ICD-10-CM | POA: Diagnosis not present

## 2016-06-20 ENCOUNTER — Emergency Department (HOSPITAL_COMMUNITY): Payer: Medicare HMO

## 2016-06-20 ENCOUNTER — Encounter (HOSPITAL_COMMUNITY): Payer: Self-pay | Admitting: Cardiology

## 2016-06-20 ENCOUNTER — Emergency Department (HOSPITAL_COMMUNITY)
Admission: EM | Admit: 2016-06-20 | Discharge: 2016-06-20 | Disposition: A | Discharge status: Home / Self Care | Payer: Medicare HMO | Attending: Emergency Medicine | Admitting: Emergency Medicine

## 2016-06-20 DIAGNOSIS — I82491 Acute embolism and thrombosis of other specified deep vein of right lower extremity: Secondary | ICD-10-CM | POA: Diagnosis not present

## 2016-06-20 DIAGNOSIS — R609 Edema, unspecified: Secondary | ICD-10-CM

## 2016-06-20 DIAGNOSIS — Z79899 Other long term (current) drug therapy: Secondary | ICD-10-CM | POA: Diagnosis not present

## 2016-06-20 DIAGNOSIS — M7989 Other specified soft tissue disorders: Secondary | ICD-10-CM | POA: Diagnosis present

## 2016-06-20 DIAGNOSIS — I82493 Acute embolism and thrombosis of other specified deep vein of lower extremity, bilateral: Secondary | ICD-10-CM

## 2016-06-20 DIAGNOSIS — I10 Essential (primary) hypertension: Secondary | ICD-10-CM | POA: Diagnosis not present

## 2016-06-20 DIAGNOSIS — R0602 Shortness of breath: Secondary | ICD-10-CM | POA: Diagnosis not present

## 2016-06-20 DIAGNOSIS — I82403 Acute embolism and thrombosis of unspecified deep veins of lower extremity, bilateral: Secondary | ICD-10-CM | POA: Diagnosis not present

## 2016-06-20 DIAGNOSIS — R918 Other nonspecific abnormal finding of lung field: Secondary | ICD-10-CM | POA: Diagnosis not present

## 2016-06-20 DIAGNOSIS — I82492 Acute embolism and thrombosis of other specified deep vein of left lower extremity: Secondary | ICD-10-CM | POA: Diagnosis not present

## 2016-06-20 DIAGNOSIS — R600 Localized edema: Secondary | ICD-10-CM | POA: Diagnosis not present

## 2016-06-20 DIAGNOSIS — Z853 Personal history of malignant neoplasm of breast: Secondary | ICD-10-CM | POA: Insufficient documentation

## 2016-06-20 DIAGNOSIS — M129 Arthropathy, unspecified: Secondary | ICD-10-CM | POA: Diagnosis not present

## 2016-06-20 DIAGNOSIS — Z7982 Long term (current) use of aspirin: Secondary | ICD-10-CM | POA: Diagnosis not present

## 2016-06-20 DIAGNOSIS — I82413 Acute embolism and thrombosis of femoral vein, bilateral: Secondary | ICD-10-CM | POA: Diagnosis not present

## 2016-06-20 LAB — TROPONIN I: Troponin I: <0.03 ng/mL (ref <0.03)

## 2016-06-20 LAB — CBC WITH DIFFERENTIAL/PLATELET
BASOS ABS: 0 10*3/uL (ref 0.0–0.1)
BASOS PCT: 1 %
Eosinophils Absolute: 0.3 10*3/uL (ref 0.0–0.7)
Eosinophils Relative: 7 %
HEMATOCRIT: 29.9 % — AB (ref 36.0–46.0)
HEMOGLOBIN: 9.9 g/dL — AB (ref 12.0–15.0)
Lymphocytes Relative: 35 %
Lymphs Abs: 1.4 10*3/uL (ref 0.7–4.0)
MCH: 28.4 pg (ref 26.0–34.0)
MCHC: 33.1 g/dL (ref 30.0–36.0)
MCV: 85.9 fL (ref 78.0–100.0)
Monocytes Absolute: 0.5 10*3/uL (ref 0.1–1.0)
Monocytes Relative: 12 %
NEUTROS ABS: 1.8 10*3/uL (ref 1.7–7.7)
NEUTROS PCT: 45 %
Platelets: 171 10*3/uL (ref 150–400)
RBC: 3.48 MIL/uL — ABNORMAL LOW (ref 3.87–5.11)
RDW: 16.5 % — ABNORMAL HIGH (ref 11.5–15.5)
WBC: 3.9 10*3/uL — ABNORMAL LOW (ref 4.0–10.5)

## 2016-06-20 LAB — BASIC METABOLIC PANEL
ANION GAP: 7 (ref 5–15)
BUN: 29 mg/dL — ABNORMAL HIGH (ref 6–20)
CALCIUM: 9 mg/dL (ref 8.9–10.3)
CO2: 24 mmol/L (ref 22–32)
Chloride: 109 mmol/L (ref 101–111)
Creatinine, Ser: 1.36 mg/dL — ABNORMAL HIGH (ref 0.44–1.00)
GFR, EST AFRICAN AMERICAN: 38 mL/min — AB (ref >60)
GFR, EST NON AFRICAN AMERICAN: 33 mL/min — AB (ref >60)
GLUCOSE: 85 mg/dL (ref 65–99)
POTASSIUM: 4 mmol/L (ref 3.5–5.1)
Sodium: 140 mmol/L (ref 135–145)

## 2016-06-20 LAB — BRAIN NATRIURETIC PEPTIDE: B NATRIURETIC PEPTIDE 5: 1021 pg/mL — AB (ref 0.0–100.0)

## 2016-06-20 MED ORDER — RIVAROXABAN (XARELTO) VTE STARTER PACK (15 & 20 MG): ORAL_TABLET | ORAL | 0 refills | Status: DC

## 2016-06-20 MED ORDER — FUROSEMIDE 20 MG PO TABS: 20.0000 mg | ORAL_TABLET | Freq: Every day | ORAL | 0 refills | Status: DC

## 2016-06-20 NOTE — ED Notes (Signed)
Pt In nad. Educated on DVT and when to come back to ED

## 2016-06-20 NOTE — ED Provider Notes (Signed)
[place] DEPT Provider Note   CSN: [id number] Arrival date & time: 06/20/16  1042     History   Chief Complaint Chief Complaint  Patient presents with  . Leg Swelling    HPI Angela Higgins is a 83 y.o. female.  Patient is a 83 year old female with past medical history of hypertension and breast cancer. She presents for evaluation of leg swelling. She reports both legs with increased swelling over the past 2 weeks, the right greater than the left. She denies any new injury or trauma. She denies any chest pain. She does report mild shortness of breath with ambulation, however is unable to express whether this is worse than normal. She denies any fevers or chills. She denies any bowel or bladder complaints.   The history is provided by the patient.    Past Medical History:  Diagnosis Date  . Breast cancer ([place])   . Hormone receptor positive breast cancer, right ([place]) 02/12/2016  . Hypertension   . Paralysis [facility])     Patient Active Problem List   Diagnosis Date Noted  . Hormone receptor positive breast cancer, right ([place]) 02/12/2016  . DEGENERATIVE DISC DISEASE, LUMBAR SPINE 09/08/2009  . SCIATICA 09/08/2009  . SCOLIOSIS, LUMBAR SPINE 09/08/2009    Past Surgical History:  Procedure Laterality Date  . APPENDECTOMY    . HERNIA REPAIR      OB History    No data available       Home Medications    Prior to Admission medications   Medication Sig Start Date End Date Taking? Authorizing Provider  amLODipine (NORVASC) 5 MG tablet Take 5 mg by mouth daily.  01/05/16   Historical Provider, MD  anastrozole (ARIMIDEX) 1 MG tablet TAKE ONE TABLET BY MOUTH ONCE DAILY. 06/05/16   Patrici Ranks, MD  aspirin EC 81 MG tablet Take 81 mg by mouth at bedtime.    Historical Provider, MD  baclofen (LIORESAL) 10 MG tablet  01/09/16   Historical Provider, MD  lisinopril-hydrochlorothiazide (PRINZIDE,ZESTORETIC) 20-25 MG tablet Take 1 tablet by mouth daily.  01/05/16   Historical  Provider, MD  Multiple Vitamin (MULTIVITAMIN WITH MINERALS) TABS Take 1 tablet by mouth every morning.    Historical Provider, MD    Family History History reviewed. No pertinent family history.  Social History Social History  Substance Use Topics  . Smoking status: Never Smoker  . Smokeless tobacco: Never Used  . Alcohol use No     Allergies   Patient has no known allergies.   Review of Systems Review of Systems  All other systems reviewed and are negative.    Physical Exam Updated Vital Signs BP (!) 141/49 (BP Location: Right Arm)   Pulse 98   Temp 98.6 F (37 C) (Oral)   Resp 20   Ht 5\' 2"  (1.575 m)   Wt 110 lb (49.9 kg)   SpO2 100%   BMI 20.12 kg/m   Physical Exam  Constitutional: She is oriented to person, place, and time. She appears well-developed and well-nourished. No distress.  HENT:  Head: Normocephalic and atraumatic.  Mouth/Throat: Oropharynx is clear and moist.  Neck: Normal range of motion. Neck supple.  Cardiovascular: Normal rate and regular rhythm.  Exam reveals no gallop and no friction rub.   No murmur heard. Pulmonary/Chest: Effort normal and breath sounds normal. No respiratory distress. She has no wheezes.  Abdominal: Soft. Bowel sounds are normal. She exhibits no distension. There is no tenderness.  Musculoskeletal: Normal range of  motion. She exhibits edema.  There is 3+ pitting edema of the right lower extremity and 2+ pitting edema of the left lower extremity. There is no significant calf tenderness and Homans sign is absent bilaterally.  Neurological: She is alert and oriented to person, place, and time.  Skin: Skin is warm and dry. She is not diaphoretic.  Nursing note and vitals reviewed.    ED Treatments / Results  Labs (all labs ordered are listed, but only abnormal results are displayed) Labs Reviewed  BASIC METABOLIC PANEL  CBC WITH DIFFERENTIAL/PLATELET  TROPONIN I  BRAIN NATRIURETIC PEPTIDE    EKG  EKG  Interpretation None       Radiology No results found.  Procedures Procedures (including critical care time)  Medications Ordered in ED Medications - No data to display   Initial Impression / Assessment and Plan / ED Course  I have reviewed the triage vital signs and the nursing notes.  Pertinent labs & imaging results that were available during my care of the patient were reviewed by me and considered in my medical decision making (see chart for details).  Patient presents here with bilateral lower extremity swelling. Workup today reveals acute on chronic DVT. She also has an elevated BNP of 1000, however denies any chest pain. He does report some mild shortness of breath with exertion, however tells me that this is been going on for quite some time. Her oxygen saturations while I was speaking with her are 100% and she is in no acute distress.  I discussed the options with the patient, however she is adamant about not being admitted. She is here with her sister who tells me she has been getting around just fine. The patient also tells me that she has an appointment tomorrow with her primary Dr. As there is no hypoxia and she is requesting to go home, I will discharge her. She will be given prescriptions for Xarelto, Lasix, and is to see her primary Dr. tomorrow. She may need an echocardiogram or further workup due to her elevated BNP. Chest x-ray shows no overt CHF or pulmonary edema.  Final Clinical Impressions(s) / ED Diagnoses   Final diagnoses:  None    New Prescriptions New Prescriptions   No medications on file     Veryl Speak, MD 06/20/16 1546

## 2016-06-20 NOTE — Discharge Instructions (Signed)
Xarelto as prescribed.  Lasix as prescribed.  All with your primary Dr. tomorrow as scheduled, and return to the emergency department if you develop difficulty breathing, chest pain, or other new and concerning symptoms.

## 2016-06-20 NOTE — ED Notes (Signed)
Pt taken to xray 

## 2016-06-20 NOTE — ED Triage Notes (Signed)
Right leg swelling times 2 weeks.  Denies any injury

## 2016-06-21 DIAGNOSIS — M129 Arthropathy, unspecified: Secondary | ICD-10-CM | POA: Diagnosis not present

## 2016-06-21 DIAGNOSIS — I82403 Acute embolism and thrombosis of unspecified deep veins of lower extremity, bilateral: Secondary | ICD-10-CM | POA: Diagnosis not present

## 2016-06-22 DIAGNOSIS — M129 Arthropathy, unspecified: Secondary | ICD-10-CM | POA: Diagnosis not present

## 2016-06-23 ENCOUNTER — Encounter (HOSPITAL_COMMUNITY): Payer: Self-pay | Admitting: Emergency Medicine

## 2016-06-23 ENCOUNTER — Emergency Department (HOSPITAL_COMMUNITY)
Admission: EM | Admit: 2016-06-23 | Discharge: 2016-06-23 | Disposition: A | Discharge status: Home / Self Care | Payer: Medicare HMO | Attending: Emergency Medicine | Admitting: Emergency Medicine

## 2016-06-23 DIAGNOSIS — R600 Localized edema: Secondary | ICD-10-CM

## 2016-06-23 DIAGNOSIS — I10 Essential (primary) hypertension: Secondary | ICD-10-CM | POA: Insufficient documentation

## 2016-06-23 DIAGNOSIS — Z853 Personal history of malignant neoplasm of breast: Secondary | ICD-10-CM | POA: Diagnosis not present

## 2016-06-23 DIAGNOSIS — M7989 Other specified soft tissue disorders: Secondary | ICD-10-CM | POA: Diagnosis present

## 2016-06-23 DIAGNOSIS — Z7982 Long term (current) use of aspirin: Secondary | ICD-10-CM | POA: Diagnosis not present

## 2016-06-23 DIAGNOSIS — Z79899 Other long term (current) drug therapy: Secondary | ICD-10-CM | POA: Diagnosis not present

## 2016-06-23 DIAGNOSIS — M129 Arthropathy, unspecified: Secondary | ICD-10-CM | POA: Diagnosis not present

## 2016-06-23 HISTORY — DX: Acute embolism and thrombosis of unspecified deep veins of unspecified lower extremity: I82.409

## 2016-06-23 MED ORDER — HYDROXYZINE HCL 25 MG PO TABS
25.0000 mg | ORAL_TABLET | Freq: Once | ORAL | Status: AC
  Administered 2016-06-23: 25 mg via ORAL
  Filled 2016-06-23: qty 1

## 2016-06-23 MED ORDER — HYDROXYZINE HCL 25 MG PO TABS: 25.0000 mg | ORAL_TABLET | Freq: Four times a day (QID) | ORAL | 0 refills | Status: DC | PRN

## 2016-06-23 NOTE — ED Provider Notes (Signed)
[facility] DEPT Provider Note   CSN: [id number] Arrival date & time: 06/23/16  1024     History   Chief Complaint Chief Complaint  Patient presents with  . Leg Swelling    HPI Angela Higgins is a 83 y.o. female.  Patient was recently diagnosed with DVT and put on Zaroxolyn. She states she is having itching in both of her lower legs.    Leg Pain   This is a new problem. The current episode started 2 days ago. The problem occurs constantly. The problem has not changed since onset.The pain is present in the right lower leg and left lower leg. The quality of the pain is described as aching. The pain is at a severity of 1/10. Pertinent negatives include no stiffness.    Past Medical History:  Diagnosis Date  . Breast cancer ([place])   . DVT (deep venous thrombosis) ([place])    bilateral  . Hormone receptor positive breast cancer, right ([place]) 02/12/2016  . Hypertension   . Paralysis [facility])     Patient Active Problem List   Diagnosis Date Noted  . Hormone receptor positive breast cancer, right ([place]) 02/12/2016  . DEGENERATIVE DISC DISEASE, LUMBAR SPINE 09/08/2009  . SCIATICA 09/08/2009  . SCOLIOSIS, LUMBAR SPINE 09/08/2009    Past Surgical History:  Procedure Laterality Date  . APPENDECTOMY    . HERNIA REPAIR      OB History    No data available       Home Medications    Prior to Admission medications   Medication Sig Start Date End Date Taking? Authorizing Provider  amLODipine (NORVASC) 5 MG tablet Take 5 mg by mouth daily.  01/05/16  Yes Historical Provider, MD  anastrozole (ARIMIDEX) 1 MG tablet TAKE ONE TABLET BY MOUTH ONCE DAILY. 06/05/16  Yes Patrici Ranks, MD  aspirin EC 81 MG tablet Take 81 mg by mouth at bedtime.   Yes Historical Provider, MD  furosemide (LASIX) 20 MG tablet Take 1 tablet (20 mg total) by mouth daily. 06/20/16  Yes Veryl Speak, MD  latanoprost (XALATAN) 0.005 % ophthalmic solution Place 1 drop into both eyes at bedtime.   Yes Historical  Provider, MD  Multiple Vitamin (MULTIVITAMIN WITH MINERALS) TABS Take 1 tablet by mouth every morning.   Yes Historical Provider, MD  Rivaroxaban 15 & 20 MG TBPK Take as directed on package: Start with one 15mg  tablet by mouth twice a day with food. On Day 22, switch to one 20mg  tablet once a day with food. 06/20/16  Yes Veryl Speak, MD  hydrOXYzine (ATARAX/VISTARIL) 25 MG tablet Take 1 tablet (25 mg total) by mouth every 6 (six) hours as needed for itching. 06/23/16   Milton Ferguson, MD    Family History No family history on file.  Social History Social History  Substance Use Topics  . Smoking status: Never Smoker  . Smokeless tobacco: Never Used  . Alcohol use No     Allergies   Patient has no known allergies.   Review of Systems Review of Systems  Constitutional: Negative for appetite change and fatigue.  HENT: Negative for congestion, ear discharge and sinus pressure.   Eyes: Negative for discharge.  Respiratory: Negative for cough.   Cardiovascular: Negative for chest pain.  Gastrointestinal: Negative for abdominal pain and diarrhea.  Genitourinary: Negative for frequency and hematuria.  Musculoskeletal: Negative for back pain and stiffness.       Swelling to legs with itching  Skin: Negative for rash.  Neurological: Negative for seizures and headaches.  Psychiatric/Behavioral: Negative for hallucinations.     Physical Exam Updated Vital Signs BP (!) 140/52 (BP Location: Left Arm)   Pulse (!) 45   Temp 98.2 F (36.8 C) (Oral)   Resp 19   Ht 5\' 2"  (1.575 m)   Wt 120 lb (54.4 kg)   SpO2 97%   BMI 21.95 kg/m   Physical Exam  Constitutional: She is oriented to person, place, and time. She appears well-developed.  HENT:  Head: Normocephalic.  Eyes: Conjunctivae are normal.  Neck: No tracheal deviation present.  Cardiovascular:  No murmur heard. Musculoskeletal: Normal range of motion.  1+ edema to both lower legs  Neurological: She is oriented to person,  place, and time.  Skin: Skin is warm.  Psychiatric: She has a normal mood and affect.     ED Treatments / Results  Labs (all labs ordered are listed, but only abnormal results are displayed) Labs Reviewed - No data to display  EKG  EKG Interpretation None       Radiology No results found.  Procedures Procedures (including critical care time)  Medications Ordered in ED Medications  hydrOXYzine (ATARAX/VISTARIL) tablet 25 mg (25 mg Oral Given 06/23/16 1414)     Initial Impression / Assessment and Plan / ED Course  I have reviewed the triage vital signs and the nursing notes.  Pertinent labs & imaging results that were available during my care of the patient were reviewed by me and considered in my medical decision making (see chart for details).     Patient with DVT in her lower extremities. Patient being treated with salt appropriately.  Patient complains of mild itching to lower legs. Patient given Atarax which seemed to help it.  Final Clinical Impressions(s) / ED Diagnoses   Final diagnoses:  Leg edema    New Prescriptions New Prescriptions   HYDROXYZINE (ATARAX/VISTARIL) 25 MG TABLET    Take 1 tablet (25 mg total) by mouth every 6 (six) hours as needed for itching.     Milton Ferguson, MD 06/23/16 1444

## 2016-06-23 NOTE — ED Triage Notes (Signed)
Pt c/o bilateral leg itching and swelling since last night. States she was seen on 2/21 and diagnosed with bilateral DVT. No SOB. No rash.

## 2016-06-23 NOTE — Discharge Instructions (Signed)
Follow up with your md next week. °

## 2016-06-24 DIAGNOSIS — M129 Arthropathy, unspecified: Secondary | ICD-10-CM | POA: Diagnosis not present

## 2016-06-25 DIAGNOSIS — M129 Arthropathy, unspecified: Secondary | ICD-10-CM | POA: Diagnosis not present

## 2016-06-26 DIAGNOSIS — M129 Arthropathy, unspecified: Secondary | ICD-10-CM | POA: Diagnosis not present

## 2016-06-27 DIAGNOSIS — M129 Arthropathy, unspecified: Secondary | ICD-10-CM | POA: Diagnosis not present

## 2016-06-28 DIAGNOSIS — M129 Arthropathy, unspecified: Secondary | ICD-10-CM | POA: Diagnosis not present

## 2016-06-29 DIAGNOSIS — M129 Arthropathy, unspecified: Secondary | ICD-10-CM | POA: Diagnosis not present

## 2016-06-30 DIAGNOSIS — M129 Arthropathy, unspecified: Secondary | ICD-10-CM | POA: Diagnosis not present

## 2016-07-01 DIAGNOSIS — M129 Arthropathy, unspecified: Secondary | ICD-10-CM | POA: Diagnosis not present

## 2016-07-02 DIAGNOSIS — M129 Arthropathy, unspecified: Secondary | ICD-10-CM | POA: Diagnosis not present

## 2016-07-03 DIAGNOSIS — M129 Arthropathy, unspecified: Secondary | ICD-10-CM | POA: Diagnosis not present

## 2016-07-04 DIAGNOSIS — M129 Arthropathy, unspecified: Secondary | ICD-10-CM | POA: Diagnosis not present

## 2016-07-05 DIAGNOSIS — M129 Arthropathy, unspecified: Secondary | ICD-10-CM | POA: Diagnosis not present

## 2016-07-05 DIAGNOSIS — I10 Essential (primary) hypertension: Secondary | ICD-10-CM | POA: Diagnosis not present

## 2016-07-05 DIAGNOSIS — I82409 Acute embolism and thrombosis of unspecified deep veins of unspecified lower extremity: Secondary | ICD-10-CM | POA: Diagnosis not present

## 2016-07-05 DIAGNOSIS — G8190 Hemiplegia, unspecified affecting unspecified side: Secondary | ICD-10-CM | POA: Diagnosis not present

## 2016-07-05 DIAGNOSIS — M159 Polyosteoarthritis, unspecified: Secondary | ICD-10-CM | POA: Diagnosis not present

## 2016-07-06 DIAGNOSIS — M129 Arthropathy, unspecified: Secondary | ICD-10-CM | POA: Diagnosis not present

## 2016-07-07 DIAGNOSIS — M129 Arthropathy, unspecified: Secondary | ICD-10-CM | POA: Diagnosis not present

## 2016-07-08 DIAGNOSIS — M129 Arthropathy, unspecified: Secondary | ICD-10-CM | POA: Diagnosis not present

## 2016-07-09 ENCOUNTER — Other Ambulatory Visit (HOSPITAL_COMMUNITY): Payer: Self-pay | Admitting: Hematology & Oncology

## 2016-07-09 DIAGNOSIS — M129 Arthropathy, unspecified: Secondary | ICD-10-CM | POA: Diagnosis not present

## 2016-07-09 DIAGNOSIS — C50911 Malignant neoplasm of unspecified site of right female breast: Secondary | ICD-10-CM

## 2016-07-10 DIAGNOSIS — M129 Arthropathy, unspecified: Secondary | ICD-10-CM | POA: Diagnosis not present

## 2016-07-11 DIAGNOSIS — M129 Arthropathy, unspecified: Secondary | ICD-10-CM | POA: Diagnosis not present

## 2016-07-12 DIAGNOSIS — M129 Arthropathy, unspecified: Secondary | ICD-10-CM | POA: Diagnosis not present

## 2016-07-13 DIAGNOSIS — M129 Arthropathy, unspecified: Secondary | ICD-10-CM | POA: Diagnosis not present

## 2016-07-14 DIAGNOSIS — M129 Arthropathy, unspecified: Secondary | ICD-10-CM | POA: Diagnosis not present

## 2016-07-15 DIAGNOSIS — M129 Arthropathy, unspecified: Secondary | ICD-10-CM | POA: Diagnosis not present

## 2016-07-16 DIAGNOSIS — M129 Arthropathy, unspecified: Secondary | ICD-10-CM | POA: Diagnosis not present

## 2016-07-17 DIAGNOSIS — M129 Arthropathy, unspecified: Secondary | ICD-10-CM | POA: Diagnosis not present

## 2016-07-18 DIAGNOSIS — M129 Arthropathy, unspecified: Secondary | ICD-10-CM | POA: Diagnosis not present

## 2016-07-19 DIAGNOSIS — M129 Arthropathy, unspecified: Secondary | ICD-10-CM | POA: Diagnosis not present

## 2016-07-20 DIAGNOSIS — M129 Arthropathy, unspecified: Secondary | ICD-10-CM | POA: Diagnosis not present

## 2016-07-21 DIAGNOSIS — M129 Arthropathy, unspecified: Secondary | ICD-10-CM | POA: Diagnosis not present

## 2016-07-22 DIAGNOSIS — M129 Arthropathy, unspecified: Secondary | ICD-10-CM | POA: Diagnosis not present

## 2016-07-23 DIAGNOSIS — M129 Arthropathy, unspecified: Secondary | ICD-10-CM | POA: Diagnosis not present

## 2016-07-24 DIAGNOSIS — M129 Arthropathy, unspecified: Secondary | ICD-10-CM | POA: Diagnosis not present

## 2016-07-25 DIAGNOSIS — M129 Arthropathy, unspecified: Secondary | ICD-10-CM | POA: Diagnosis not present

## 2016-07-26 DIAGNOSIS — M129 Arthropathy, unspecified: Secondary | ICD-10-CM | POA: Diagnosis not present

## 2016-07-27 DIAGNOSIS — M129 Arthropathy, unspecified: Secondary | ICD-10-CM | POA: Diagnosis not present

## 2016-07-28 DIAGNOSIS — M129 Arthropathy, unspecified: Secondary | ICD-10-CM | POA: Diagnosis not present

## 2016-07-29 DIAGNOSIS — M129 Arthropathy, unspecified: Secondary | ICD-10-CM | POA: Diagnosis not present

## 2016-07-30 DIAGNOSIS — M129 Arthropathy, unspecified: Secondary | ICD-10-CM | POA: Diagnosis not present

## 2016-07-31 DIAGNOSIS — M129 Arthropathy, unspecified: Secondary | ICD-10-CM | POA: Diagnosis not present

## 2016-08-01 DIAGNOSIS — M129 Arthropathy, unspecified: Secondary | ICD-10-CM | POA: Diagnosis not present

## 2016-08-02 DIAGNOSIS — M129 Arthropathy, unspecified: Secondary | ICD-10-CM | POA: Diagnosis not present

## 2016-08-03 DIAGNOSIS — M129 Arthropathy, unspecified: Secondary | ICD-10-CM | POA: Diagnosis not present

## 2016-08-04 DIAGNOSIS — M129 Arthropathy, unspecified: Secondary | ICD-10-CM | POA: Diagnosis not present

## 2016-08-05 DIAGNOSIS — M129 Arthropathy, unspecified: Secondary | ICD-10-CM | POA: Diagnosis not present

## 2016-08-06 DIAGNOSIS — M129 Arthropathy, unspecified: Secondary | ICD-10-CM | POA: Diagnosis not present

## 2016-08-07 DIAGNOSIS — M129 Arthropathy, unspecified: Secondary | ICD-10-CM | POA: Diagnosis not present

## 2016-08-08 DIAGNOSIS — M129 Arthropathy, unspecified: Secondary | ICD-10-CM | POA: Diagnosis not present

## 2016-08-09 DIAGNOSIS — M129 Arthropathy, unspecified: Secondary | ICD-10-CM | POA: Diagnosis not present

## 2016-08-10 DIAGNOSIS — M129 Arthropathy, unspecified: Secondary | ICD-10-CM | POA: Diagnosis not present

## 2016-08-11 DIAGNOSIS — M129 Arthropathy, unspecified: Secondary | ICD-10-CM | POA: Diagnosis not present

## 2016-08-12 DIAGNOSIS — M129 Arthropathy, unspecified: Secondary | ICD-10-CM | POA: Diagnosis not present

## 2016-08-13 DIAGNOSIS — M129 Arthropathy, unspecified: Secondary | ICD-10-CM | POA: Diagnosis not present

## 2016-08-14 DIAGNOSIS — M129 Arthropathy, unspecified: Secondary | ICD-10-CM | POA: Diagnosis not present

## 2016-08-15 DIAGNOSIS — M129 Arthropathy, unspecified: Secondary | ICD-10-CM | POA: Diagnosis not present

## 2016-08-16 DIAGNOSIS — M129 Arthropathy, unspecified: Secondary | ICD-10-CM | POA: Diagnosis not present

## 2016-08-17 DIAGNOSIS — M129 Arthropathy, unspecified: Secondary | ICD-10-CM | POA: Diagnosis not present

## 2016-08-18 DIAGNOSIS — M129 Arthropathy, unspecified: Secondary | ICD-10-CM | POA: Diagnosis not present

## 2016-08-19 DIAGNOSIS — M129 Arthropathy, unspecified: Secondary | ICD-10-CM | POA: Diagnosis not present

## 2016-08-20 DIAGNOSIS — M129 Arthropathy, unspecified: Secondary | ICD-10-CM | POA: Diagnosis not present

## 2016-08-21 DIAGNOSIS — M129 Arthropathy, unspecified: Secondary | ICD-10-CM | POA: Diagnosis not present

## 2016-08-22 DIAGNOSIS — M129 Arthropathy, unspecified: Secondary | ICD-10-CM | POA: Diagnosis not present

## 2016-08-23 DIAGNOSIS — M129 Arthropathy, unspecified: Secondary | ICD-10-CM | POA: Diagnosis not present

## 2016-08-24 DIAGNOSIS — M129 Arthropathy, unspecified: Secondary | ICD-10-CM | POA: Diagnosis not present

## 2016-08-25 DIAGNOSIS — M129 Arthropathy, unspecified: Secondary | ICD-10-CM | POA: Diagnosis not present

## 2016-08-26 DIAGNOSIS — M129 Arthropathy, unspecified: Secondary | ICD-10-CM | POA: Diagnosis not present

## 2016-08-27 DIAGNOSIS — M129 Arthropathy, unspecified: Secondary | ICD-10-CM | POA: Diagnosis not present

## 2016-08-28 DIAGNOSIS — M129 Arthropathy, unspecified: Secondary | ICD-10-CM | POA: Diagnosis not present

## 2016-08-29 DIAGNOSIS — M129 Arthropathy, unspecified: Secondary | ICD-10-CM | POA: Diagnosis not present

## 2016-08-30 DIAGNOSIS — M129 Arthropathy, unspecified: Secondary | ICD-10-CM | POA: Diagnosis not present

## 2016-08-31 DIAGNOSIS — M129 Arthropathy, unspecified: Secondary | ICD-10-CM | POA: Diagnosis not present

## 2016-09-01 DIAGNOSIS — M129 Arthropathy, unspecified: Secondary | ICD-10-CM | POA: Diagnosis not present

## 2016-09-02 DIAGNOSIS — M129 Arthropathy, unspecified: Secondary | ICD-10-CM | POA: Diagnosis not present

## 2016-09-03 DIAGNOSIS — M129 Arthropathy, unspecified: Secondary | ICD-10-CM | POA: Diagnosis not present

## 2016-09-04 DIAGNOSIS — M129 Arthropathy, unspecified: Secondary | ICD-10-CM | POA: Diagnosis not present

## 2016-09-05 DIAGNOSIS — M129 Arthropathy, unspecified: Secondary | ICD-10-CM | POA: Diagnosis not present

## 2016-09-06 DIAGNOSIS — M129 Arthropathy, unspecified: Secondary | ICD-10-CM | POA: Diagnosis not present

## 2016-09-07 DIAGNOSIS — M129 Arthropathy, unspecified: Secondary | ICD-10-CM | POA: Diagnosis not present

## 2016-09-08 DIAGNOSIS — M129 Arthropathy, unspecified: Secondary | ICD-10-CM | POA: Diagnosis not present

## 2016-09-09 DIAGNOSIS — M129 Arthropathy, unspecified: Secondary | ICD-10-CM | POA: Diagnosis not present

## 2016-09-10 DIAGNOSIS — M129 Arthropathy, unspecified: Secondary | ICD-10-CM | POA: Diagnosis not present

## 2016-09-11 DIAGNOSIS — M129 Arthropathy, unspecified: Secondary | ICD-10-CM | POA: Diagnosis not present

## 2016-09-12 DIAGNOSIS — M129 Arthropathy, unspecified: Secondary | ICD-10-CM | POA: Diagnosis not present

## 2016-09-13 DIAGNOSIS — M129 Arthropathy, unspecified: Secondary | ICD-10-CM | POA: Diagnosis not present

## 2016-09-14 DIAGNOSIS — M129 Arthropathy, unspecified: Secondary | ICD-10-CM | POA: Diagnosis not present

## 2016-09-15 DIAGNOSIS — M129 Arthropathy, unspecified: Secondary | ICD-10-CM | POA: Diagnosis not present

## 2016-09-16 DIAGNOSIS — M129 Arthropathy, unspecified: Secondary | ICD-10-CM | POA: Diagnosis not present

## 2016-09-17 ENCOUNTER — Ambulatory Visit (HOSPITAL_COMMUNITY): Payer: Self-pay

## 2016-09-17 ENCOUNTER — Other Ambulatory Visit (HOSPITAL_COMMUNITY): Payer: Self-pay

## 2016-09-17 DIAGNOSIS — M129 Arthropathy, unspecified: Secondary | ICD-10-CM | POA: Diagnosis not present

## 2016-09-18 ENCOUNTER — Ambulatory Visit (HOSPITAL_COMMUNITY)
Admission: RE | Admit: 2016-09-18 | Discharge: 2016-09-18 | Disposition: A | Discharge status: Home / Self Care | Payer: Medicare HMO | Source: Ambulatory Visit | Attending: Oncology | Admitting: Oncology

## 2016-09-18 DIAGNOSIS — R922 Inconclusive mammogram: Secondary | ICD-10-CM | POA: Diagnosis not present

## 2016-09-18 DIAGNOSIS — M129 Arthropathy, unspecified: Secondary | ICD-10-CM | POA: Diagnosis not present

## 2016-09-18 DIAGNOSIS — C50911 Malignant neoplasm of unspecified site of right female breast: Secondary | ICD-10-CM | POA: Diagnosis not present

## 2016-09-18 DIAGNOSIS — Z170 Estrogen receptor positive status [ER+]: Secondary | ICD-10-CM | POA: Insufficient documentation

## 2016-09-18 DIAGNOSIS — N641 Fat necrosis of breast: Secondary | ICD-10-CM | POA: Diagnosis not present

## 2016-09-19 DIAGNOSIS — M129 Arthropathy, unspecified: Secondary | ICD-10-CM | POA: Diagnosis not present

## 2016-09-20 ENCOUNTER — Encounter (HOSPITAL_BASED_OUTPATIENT_CLINIC_OR_DEPARTMENT_OTHER): Payer: Medicare HMO | Admitting: Oncology

## 2016-09-20 ENCOUNTER — Encounter (HOSPITAL_COMMUNITY): Payer: Medicare HMO | Attending: Oncology

## 2016-09-20 ENCOUNTER — Encounter (HOSPITAL_COMMUNITY): Payer: Self-pay

## 2016-09-20 VITALS — BP 145/39 | HR 60 | Temp 98.0°F | Resp 16 | Wt 111.2 lb

## 2016-09-20 DIAGNOSIS — Z79811 Long term (current) use of aromatase inhibitors: Secondary | ICD-10-CM

## 2016-09-20 DIAGNOSIS — C50911 Malignant neoplasm of unspecified site of right female breast: Secondary | ICD-10-CM | POA: Insufficient documentation

## 2016-09-20 DIAGNOSIS — Z170 Estrogen receptor positive status [ER+]: Secondary | ICD-10-CM

## 2016-09-20 DIAGNOSIS — M129 Arthropathy, unspecified: Secondary | ICD-10-CM | POA: Diagnosis not present

## 2016-09-20 LAB — CBC WITH DIFFERENTIAL/PLATELET
Basophils Absolute: 0 10*3/uL (ref 0.0–0.1)
Basophils Relative: 1 %
EOS ABS: 0.3 10*3/uL (ref 0.0–0.7)
EOS PCT: 8 %
HCT: 30.6 % — ABNORMAL LOW (ref 36.0–46.0)
HEMOGLOBIN: 9.9 g/dL — AB (ref 12.0–15.0)
LYMPHS ABS: 2.1 10*3/uL (ref 0.7–4.0)
Lymphocytes Relative: 49 %
MCH: 27.7 pg (ref 26.0–34.0)
MCHC: 32.4 g/dL (ref 30.0–36.0)
MCV: 85.5 fL (ref 78.0–100.0)
MONO ABS: 0.3 10*3/uL (ref 0.1–1.0)
MONOS PCT: 7 %
NEUTROS PCT: 35 %
Neutro Abs: 1.5 10*3/uL — ABNORMAL LOW (ref 1.7–7.7)
Platelets: 178 10*3/uL (ref 150–400)
RBC: 3.58 MIL/uL — ABNORMAL LOW (ref 3.87–5.11)
RDW: 17.5 % — AB (ref 11.5–15.5)
WBC: 4.3 10*3/uL (ref 4.0–10.5)

## 2016-09-20 LAB — COMPREHENSIVE METABOLIC PANEL
ALK PHOS: 72 U/L (ref 38–126)
ALT: 16 U/L (ref 14–54)
AST: 24 U/L (ref 15–41)
Albumin: 3.7 g/dL (ref 3.5–5.0)
Anion gap: 7 (ref 5–15)
BUN: 36 mg/dL — ABNORMAL HIGH (ref 6–20)
CALCIUM: 9.6 mg/dL (ref 8.9–10.3)
CO2: 29 mmol/L (ref 22–32)
Chloride: 104 mmol/L (ref 101–111)
Creatinine, Ser: 1.49 mg/dL — ABNORMAL HIGH (ref 0.44–1.00)
GFR calc Af Amer: 34 mL/min — ABNORMAL LOW (ref >60)
GFR calc non Af Amer: 29 mL/min — ABNORMAL LOW (ref >60)
Glucose, Bld: 81 mg/dL (ref 65–99)
POTASSIUM: 4.2 mmol/L (ref 3.5–5.1)
SODIUM: 140 mmol/L (ref 135–145)
TOTAL PROTEIN: 7.3 g/dL (ref 6.5–8.1)
Total Bilirubin: 0.5 mg/dL (ref 0.3–1.2)

## 2016-09-20 NOTE — Patient Instructions (Addendum)
[place] at [facility] Discharge Instructions  RECOMMENDATIONS MADE BY THE CONSULTANT AND ANY TEST RESULTS WILL BE SENT TO YOUR REFERRING PHYSICIAN.  You were seen today by Dr. Twana First Mammogram will be scheduled mid-September Follow up in 4 months   Thank you for choosing [place] at [facility] to provide your oncology and hematology care.  To afford each patient quality time with our provider, please arrive at least 15 minutes before your scheduled appointment time.    If you have a lab appointment with the [facility] please come in thru the  Main Entrance and check in at the main information desk  You need to re-schedule your appointment should you arrive 10 or more minutes late.  We strive to give you quality time with our providers, and arriving late affects you and other patients whose appointments are after yours.  Also, if you no show three or more times for appointments you may be dismissed from the clinic at the providers discretion.     Again, thank you for choosing [facility].  Our hope is that these requests will decrease the amount of time that you wait before being seen by our physicians.       _____________________________________________________________  Should you have questions after your visit to [facility], please contact our office at [phone] between the hours of 8:30 a.m. and 4:30 p.m.  Voicemails left after 4:30 p.m. will not be returned until the following business day.  For prescription refill requests, have your pharmacy contact our office.       Resources For Cancer Patients and their Caregivers ? American Cancer Society: Can assist with transportation, wigs, general needs, runs Look Good Feel Better.        980-833-8457 ? Cancer Care: Provides financial assistance, online support groups, medication/co-pay assistance.  1-800-813-HOPE 2402603570) ? [facility] Assists [place] cancer patients and their families through emotional , educational and financial support.  585-161-3919 ? [place] DSS Where to apply for food stamps, Medicaid and utility assistance. (925)490-8569 ? [employer]: Transportation to medical appointments. (763)557-9459 ? Social Security Administration: May apply for disability if have a Stage IV cancer. (336)362-5487 309-499-8827 ? [employer], Disability and Transit Services: Assists with nutrition, care and transit needs. [place] Support Programs: @10RELATIVEDAYS @ > Cancer Support Group  2nd Tuesday of the month 1pm-2pm, Journey Room  > Creative Journey  3rd Tuesday of the month 1130am-1pm, Journey Room  > Look Good Feel Better  1st Wednesday of the month 10am-12 noon, Journey Room (Call [facility] to register [phone])

## 2016-09-20 NOTE — Progress Notes (Signed)
Rosita Fire, MD [place] [id number]  Hormone receptor positive breast cancer, right [facility]) - Plan: MM DIAG BREAST TOMO BILATERAL, US Breast Complete Uni Right Inc Axilla, US Breast Complete Uni Left Inc Axilla  CURRENT THERAPY: Arimidex daily beginning on 02/08/2016  INTERVAL HISTORY: Angela Higgins 83 y.o. female returns for followup of ER+/HER2+ right breast cancer. Started Arimidex on 02/08/2016 after refusing HER2 targeted therapy and surgery.    Hormone receptor positive breast cancer, right ([place])   01/04/2016 Mammogram    Screening mammogram, calcifications in R breast      01/12/2016 Mammogram    Diagnostic mammogram There are coarse and heterogeneous calcifications in the inferior medial right breast spanning 5.1 cm. There are some scattered similar-appearing calcifications behind the nipple at a posterior depth as well      01/18/2016 Mammogram    Mammographic images were obtained following stereotactic guided biopsy of right breast calcifications. The coil shaped marker is in good position.  IMPRESSION: Appropriate placement of biopsy marker      01/18/2016 Initial Biopsy    Stereotactic-guided biopsy of right breast calcifications. No apparent complications      10/17/5091 Pathology Results    Breast, right, needle core biopsy, medial SMALL FOCUS OF INVASIVE DUCTAL CARCINOMA, DUCTAL CARCINOMA IN SITU WITH CALCIFICATIONS AND NECROSIS, GRADE 3 Estrogen Receptor: 100%, POSITIVE, STRONG STAINING INTENSITY Progesterone Receptor: 60%, POSITIVE, STRONG STAINING INTENSITY Proliferation Marker Ki67: 5% HER2 - **POSITIVE** RATIO OF HER2/CEP17 SIGNALS 2.60 AVERAGE HER2 COPY NUMBER PER CELL 3.90      02/08/2016 -  Anti-estrogen oral therapy         06/05/2016 Breast US    Diagnostic mammogram tomo uni right & US breast ltd uni right inc axilla IMPRESSION: 1. Calcifications at site of biopsy proven malignancy in the inner right  breast as well as additional smaller group of calcifications in the slightly outer right breast appear overall stable. 2. New mass in the upper inner right breast at the 2 o'clock location possibly related to post biopsy change although papilloma or additional site of malignancy cannot be excluded.      She states that overall she feels well. She continues to take Aromasin next time any side effects. She denies any new bone pain. She denies any chest pain, shortness breath, abdominal pain, focal weakness. Patient a repeat diagnostic mammogram with ultrasound on 09/18/16 which demonstrated that her primary biopsy proven area of malignancy in the inner right breast is stable at 5 cm. The mass previously identified in the right breast at 2:00 5 cm from the nipple is not visualized. There is no mammographic or targeted sonographic findings in the superior right breast explain the patient's pain.  Review of Systems  Constitutional: Negative.  Negative for chills and fever.  HENT: Negative.   Eyes: Negative.   Respiratory: Negative.  Negative for shortness of breath.   Cardiovascular: Negative for chest pain.  Gastrointestinal: Negative.  Negative for nausea and vomiting.  Genitourinary: Negative.   Musculoskeletal: Negative.  Negative for joint pain.  Skin: Negative.  Negative for itching and rash.  Neurological: Negative.   Endo/Heme/Allergies: Negative.   Psychiatric/Behavioral: Negative.     Past Medical History:  Diagnosis Date  . Breast cancer ([place])   . DVT (deep venous thrombosis) ([place])    bilateral  . Hormone receptor positive breast cancer, right ([place]) 02/12/2016  . Hypertension   . Paralysis ([place])     Past  Surgical History:  Procedure Laterality Date  . APPENDECTOMY    . HERNIA REPAIR      History reviewed. No pertinent family history.  Social History   Social History  . Marital status: Single    Spouse name: N/A  . Number of children: N/A  . Years of education:  N/A   Social History Main Topics  . Smoking status: Never Smoker  . Smokeless tobacco: Never Used  . Alcohol use No  . Drug use: No  . Sexual activity: No   Other Topics Concern  . None   Social History Narrative  . None     PHYSICAL EXAMINATION  ECOG PERFORMANCE STATUS: 2 - Symptomatic, <50% confined to bed  Vitals:   09/20/16 1418  BP: (!) 145/39  Pulse: 60  Resp: 16  Temp: 98 F (36.7 C)    Physical Exam  Constitutional: She is oriented to person, place, and time and well-developed, well-nourished, and in no distress.  HENT:  Head: Normocephalic and atraumatic.  Mouth/Throat: Oropharynx is clear and moist.  Eyes: Conjunctivae and EOM are normal. Pupils are equal, round, and reactive to light.  Neck: Normal range of motion. Neck supple.  Cardiovascular: Normal rate, regular rhythm and normal heart sounds.   Pulmonary/Chest: Effort normal and breath sounds normal. Right breast exhibits no inverted nipple, no mass, no nipple discharge, no skin change and no tenderness. Left breast exhibits no inverted nipple, no mass, no nipple discharge, no skin change and no tenderness.    Abdominal: Soft. Bowel sounds are normal.  Musculoskeletal: Normal range of motion.  Neurological: She is alert and oriented to person, place, and time. Gait normal.  Skin: Skin is warm and dry.  Nursing note and vitals reviewed.   LABORATORY DATA: I have reviewed the data as listed. CBC    Component Value Date/Time   WBC 4.3 09/20/2016 1328   RBC 3.58 (L) 09/20/2016 1328   HGB 9.9 (L) 09/20/2016 1328   HCT 30.6 (L) 09/20/2016 1328   PLT 178 09/20/2016 1328   MCV 85.5 09/20/2016 1328   MCH 27.7 09/20/2016 1328   MCHC 32.4 09/20/2016 1328   RDW 17.5 (H) 09/20/2016 1328   LYMPHSABS 2.1 09/20/2016 1328   MONOABS 0.3 09/20/2016 1328   EOSABS 0.3 09/20/2016 1328   BASOSABS 0.0 09/20/2016 1328      Chemistry      Component Value Date/Time   NA 140 09/20/2016 1328   K 4.2 09/20/2016  1328   CL 104 09/20/2016 1328   CO2 29 09/20/2016 1328   BUN 36 (H) 09/20/2016 1328   CREATININE 1.49 (H) 09/20/2016 1328      Component Value Date/Time   CALCIUM 9.6 09/20/2016 1328   ALKPHOS 72 09/20/2016 1328   AST 24 09/20/2016 1328   ALT 16 09/20/2016 1328   BILITOT 0.5 09/20/2016 1328        PENDING LABS:   RADIOGRAPHIC STUDIES: I have personally reviewed the radiological images as listed and agreed with the findings in the report.  US Breast Complete Uni Right Inc Axilla  Result Date: 09/18/2016 CLINICAL DATA:  83 year old female recently diagnosed with DCIS and a focus of invasive disease status post stereotactic biopsy of the right breast in September of 2017. Due to the patient's age, and the patient opted for medical therapy rather than surgery. She presents today for short-term follow-up of a right breast mass identified on ultrasound in February of 2018. Biopsy was recommended, but again, due to the  patient's age follow-up was pursued as the patient would like to avoid biopsy.During the ultrasound exam, the patient also described a new tenderness in the superior right breast. EXAM: 2D DIGITAL DIAGNOSTIC UNILATERAL RIGHT MAMMOGRAM WITH CAD AND ADJUNCT TOMO RIGHT BREAST ULTRASOUND COMPARISON:  Previous exam(s). ACR Breast Density Category c: The breast tissue is heterogeneously dense, which may obscure small masses. FINDINGS: There is no significant interval change in the approximate 5 cm area of coarse heterogeneous calcifications in the medial aspect of the right breast. The coil shaped biopsy marking clip is again identified within the area of calcifications. The 5 mm group of coarse heterogeneous calcifications in the slightly outer posterior aspect of the right breast are also stable. Mammographic images were processed with CAD. The mass in question on the prior ultrasound previously identified at 2 o'clock, 5 cm is not visualized. Instead, there is an area of fat necrosis in  the right breast at 2 o'clock, 3 cm from the nipple. Note that the patient was scanned sitting upright in her wheelchair due to her physical limitations. Ultrasound of the superior right breast demonstrates normal fibroglandular tissue. No masses or suspicious areas of shadowing are identified in the area of the patient's pain. IMPRESSION: 1. The calcifications in the right breast corresponding with the patient's known malignancy appear mammographically stable. 2. The mass previously identified in the right breast at 2 o'clock, 5 cm from the nipple is not visualized. Fat necrosis is seen at 2 o'clock, 3 cm from the nipple. 3. There are no mammographic or targeted sonographic findings in the superior right breast to explain the patient's pain. RECOMMENDATION: 1. Bilateral diagnostic mammogram and right breast ultrasound is recommended in September of 2018. 2. Clinical follow-up recommended for the painful area of concern in the right breast. Any further workup should be based on clinical grounds. I have discussed the findings and recommendations with the patient. Results were also provided in writing at the conclusion of the visit. If applicable, a reminder letter will be sent to the patient regarding the next appointment. BI-RADS CATEGORY  6: Known biopsy-proven malignancy. Electronically Signed   By: Ammie Ferrier M.D.   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 Due to the patient's age, and the patient opted for medical therapy rather than surgery. She presents today for short-term follow-up of a right breast mass identified on ultrasound in February of 2018. Biopsy was recommended, but again, due to the patient's age follow-up was pursued as the patient would like to avoid biopsy.During the ultrasound  exam, the patient also described a new tenderness in the superior right breast. EXAM: 2D DIGITAL DIAGNOSTIC UNILATERAL RIGHT MAMMOGRAM WITH CAD AND ADJUNCT TOMO RIGHT BREAST ULTRASOUND COMPARISON:  Previous exam(s). ACR Breast Density Category c: The breast tissue is heterogeneously dense, which may obscure small masses. FINDINGS: There is no significant interval change in the approximate 5 cm area of coarse heterogeneous calcifications in the medial aspect of the right breast. The coil shaped biopsy marking clip is again identified within the area of calcifications. The 5 mm group of coarse heterogeneous calcifications in the slightly outer posterior aspect of the right breast are also stable. Mammographic images were processed with CAD. The mass in question on the prior ultrasound previously identified at 2 o'clock, 5 cm is  not visualized. Instead, there is an area of fat necrosis in the right breast at 2 o'clock, 3 cm from the nipple. Note that the patient was scanned sitting upright in her wheelchair due to her physical limitations. Ultrasound of the superior right breast demonstrates normal fibroglandular tissue. No masses or suspicious areas of shadowing are identified in the area of the patient's pain. IMPRESSION: 1. The calcifications in the right breast corresponding with the patient's known malignancy appear mammographically stable. 2. The mass previously identified in the right breast at 2 o'clock, 5 cm from the nipple is not visualized. Fat necrosis is seen at 2 o'clock, 3 cm from the nipple. 3. There are no mammographic or targeted sonographic findings in the superior right breast to explain the patient's pain. RECOMMENDATION: 1. Bilateral diagnostic mammogram and right breast ultrasound is recommended in September of 2018. 2. Clinical follow-up recommended for the painful area of concern in the right breast. Any further workup should be based on clinical grounds. I have discussed the findings and  recommendations with the patient. Results were also provided in writing at the conclusion of the visit. If applicable, a reminder letter will be sent to the patient regarding the next appointment. BI-RADS CATEGORY  6: Known biopsy-proven malignancy. Electronically Signed   By: Ammie Ferrier M.D.   On: 09/18/2016 10:42   Diagnostic mammogram tomo uni right & US breast uni right inc axilla 06/05/2016 IMPRESSION: 1. Calcifications at site of biopsy proven malignancy in the inner right breast as well as additional smaller group of calcifications in the slightly outer right breast appear overall stable.  2. New mass in the upper inner right breast at the 2 o'clock location possibly related to post biopsy change although papilloma or additional site of malignancy cannot be excluded.  PATHOLOGY:    ASSESSMENT AND PLAN:  Hormone receptor positive breast cancer, right (H[facility]) ER+/HER2+ right breast cancer.   Diagnostic mammogram and right breast ultrasound performed on 09/18/2016 showed overall stability of the proven malignancy in the inner right breast. Continue arimidex at this time. Will repeat diagnostic b/l breast mammogram with [place] in 4 months. RTC in late September after her mammogram is done.     ORDERS PLACED FOR THIS ENCOUNTER: Orders Placed This Encounter  Procedures  . MM DIAG BREAST TOMO BILATERAL  . US Breast Complete Uni Right Inc Axilla  . US Breast Complete Uni Left Inc Axilla    THERAPY PLAN:  Continue Arimidex daily.  All questions were answered. The patient knows to call the clinic with any problems, questions or concerns. We can certainly see the patient much sooner if necessary.  This document serves as a record of services personally performed by Twana First, MD. It was created on her behalf by Arlyce Harman, a trained medical scribe. The creation of this record is based on the scribe's personal observations and the provider's statements to them. This document  has been checked and approved by the attending provider.  I have reviewed the above documentation for accuracy and completeness and I agree with the above.  This note is electronically signed by: Twana First, MD 09/20/2016 3:22 PM

## 2016-09-21 DIAGNOSIS — M129 Arthropathy, unspecified: Secondary | ICD-10-CM | POA: Diagnosis not present

## 2016-09-22 DIAGNOSIS — M129 Arthropathy, unspecified: Secondary | ICD-10-CM | POA: Diagnosis not present

## 2016-09-23 DIAGNOSIS — M129 Arthropathy, unspecified: Secondary | ICD-10-CM | POA: Diagnosis not present

## 2016-09-24 DIAGNOSIS — M129 Arthropathy, unspecified: Secondary | ICD-10-CM | POA: Diagnosis not present

## 2016-09-25 DIAGNOSIS — M129 Arthropathy, unspecified: Secondary | ICD-10-CM | POA: Diagnosis not present

## 2016-09-26 ENCOUNTER — Other Ambulatory Visit (HOSPITAL_COMMUNITY): Payer: Self-pay | Admitting: *Deleted

## 2016-09-26 DIAGNOSIS — C50911 Malignant neoplasm of unspecified site of right female breast: Secondary | ICD-10-CM

## 2016-09-26 DIAGNOSIS — M129 Arthropathy, unspecified: Secondary | ICD-10-CM | POA: Diagnosis not present

## 2016-09-27 DIAGNOSIS — M129 Arthropathy, unspecified: Secondary | ICD-10-CM | POA: Diagnosis not present

## 2016-09-28 ENCOUNTER — Encounter (HOSPITAL_COMMUNITY): Payer: Medicare HMO | Attending: Oncology

## 2016-09-28 DIAGNOSIS — Z170 Estrogen receptor positive status [ER+]: Secondary | ICD-10-CM | POA: Insufficient documentation

## 2016-09-28 DIAGNOSIS — M129 Arthropathy, unspecified: Secondary | ICD-10-CM | POA: Diagnosis not present

## 2016-09-28 DIAGNOSIS — C50911 Malignant neoplasm of unspecified site of right female breast: Secondary | ICD-10-CM | POA: Insufficient documentation

## 2016-09-28 NOTE — Progress Notes (Signed)
Nutrition Assessment   Reason for Assessment:   Patient interested in oral nutrition supplements  ASSESSMENT:  83 year old female with right breast cancer. Patient currently taking Arimidex (since 01/2016). Past medical history of DVT, HTN, paralysis  Patient seen in clinic this am with sister.  Patient reports fairly good appetite.  Reports sister and another caregiver help prepare meals for her.  Reports she typically has oatmeal or grits with juice for breakfast and maybe sometimes bologna. For lunch often has a peanut butter sandwich and for dinner usually a meat (chicken) and vegetables (greens, cabbage, pintos) and cornbread.  Patient reports she usually does not snack much during the day.   Patient reports some trouble with constipation.  No trouble chewing, swallowing, mouth pain reported per patient.   Nutrition Focused Physical Exam: deferred  Medications: arimidex, lasix, MVI  Labs: BUN 36, creatinine 1.49  Anthropometrics:   Height: 62 inches Weight: 111 lb 3.2 oz UBW: 111 lb. Last measured weight in clinic Jan 08/2016 and 108 lb.  Question accuracy of Feb weights as hospital admission weights BMI: 20  Stable weight per clinic scales.   Estimated Energy Needs  Kcals: 1250-1500 calories/d Protein: 60-75 g/d Fluid: 1.5 L/d  NUTRITION DIAGNOSIS: Increased calories needs related to cancer diagnosis as evidenced by patient interest in oral nutrition supplements.   MALNUTRITION DIAGNOSIS: continue to assess   INTERVENTION:   Discussed overall healthy diet for patient and encouraged good sources of protein. Examples of foods discussed Discussed oral nutrition supplements and differences.  Provided 1st complimentary case of ensure plus today. Gave sample of boost plus and coupons to try as well. Encouraged 1-2 per day in addition to usual intake. Discussed strategies to help with constipation. Fact sheet given.  Contact information given    MONITORING, EVALUATION,  GOAL: Patient will consume adequate calories and protein to prevent weight loss   NEXT VISIT: Sept 21 after MD appointment  [place] Merida B. Zenia Resides, [place], [place] Registered Dietitian [phone] (pager)

## 2016-09-29 DIAGNOSIS — M129 Arthropathy, unspecified: Secondary | ICD-10-CM | POA: Diagnosis not present

## 2016-09-30 DIAGNOSIS — M129 Arthropathy, unspecified: Secondary | ICD-10-CM | POA: Diagnosis not present

## 2016-10-11 DIAGNOSIS — H353132 Nonexudative age-related macular degeneration, bilateral, intermediate dry stage: Secondary | ICD-10-CM | POA: Diagnosis not present

## 2016-10-11 DIAGNOSIS — H3540 Unspecified peripheral retinal degeneration: Secondary | ICD-10-CM | POA: Diagnosis not present

## 2016-10-11 DIAGNOSIS — H3562 Retinal hemorrhage, left eye: Secondary | ICD-10-CM | POA: Diagnosis not present

## 2016-10-11 DIAGNOSIS — H1045 Other chronic allergic conjunctivitis: Secondary | ICD-10-CM | POA: Diagnosis not present

## 2016-10-11 DIAGNOSIS — Z0100 Encounter for examination of eyes and vision without abnormal findings: Secondary | ICD-10-CM | POA: Diagnosis not present

## 2016-10-12 DIAGNOSIS — M129 Arthropathy, unspecified: Secondary | ICD-10-CM | POA: Diagnosis not present

## 2016-10-13 DIAGNOSIS — M129 Arthropathy, unspecified: Secondary | ICD-10-CM | POA: Diagnosis not present

## 2016-10-14 DIAGNOSIS — M129 Arthropathy, unspecified: Secondary | ICD-10-CM | POA: Diagnosis not present

## 2016-10-15 DIAGNOSIS — M129 Arthropathy, unspecified: Secondary | ICD-10-CM | POA: Diagnosis not present

## 2016-10-16 DIAGNOSIS — M129 Arthropathy, unspecified: Secondary | ICD-10-CM | POA: Diagnosis not present

## 2016-10-17 DIAGNOSIS — M129 Arthropathy, unspecified: Secondary | ICD-10-CM | POA: Diagnosis not present

## 2016-10-18 DIAGNOSIS — M129 Arthropathy, unspecified: Secondary | ICD-10-CM | POA: Diagnosis not present

## 2016-10-19 DIAGNOSIS — M129 Arthropathy, unspecified: Secondary | ICD-10-CM | POA: Diagnosis not present

## 2016-10-20 DIAGNOSIS — M129 Arthropathy, unspecified: Secondary | ICD-10-CM | POA: Diagnosis not present

## 2016-10-21 DIAGNOSIS — M129 Arthropathy, unspecified: Secondary | ICD-10-CM | POA: Diagnosis not present

## 2016-10-22 DIAGNOSIS — M129 Arthropathy, unspecified: Secondary | ICD-10-CM | POA: Diagnosis not present

## 2016-10-23 DIAGNOSIS — M129 Arthropathy, unspecified: Secondary | ICD-10-CM | POA: Diagnosis not present

## 2016-10-24 DIAGNOSIS — M129 Arthropathy, unspecified: Secondary | ICD-10-CM | POA: Diagnosis not present

## 2016-10-25 DIAGNOSIS — M129 Arthropathy, unspecified: Secondary | ICD-10-CM | POA: Diagnosis not present

## 2016-10-26 DIAGNOSIS — M129 Arthropathy, unspecified: Secondary | ICD-10-CM | POA: Diagnosis not present

## 2016-10-27 DIAGNOSIS — M129 Arthropathy, unspecified: Secondary | ICD-10-CM | POA: Diagnosis not present

## 2016-10-28 DIAGNOSIS — M129 Arthropathy, unspecified: Secondary | ICD-10-CM | POA: Diagnosis not present

## 2016-10-29 DIAGNOSIS — M129 Arthropathy, unspecified: Secondary | ICD-10-CM | POA: Diagnosis not present

## 2016-10-30 DIAGNOSIS — M129 Arthropathy, unspecified: Secondary | ICD-10-CM | POA: Diagnosis not present

## 2016-10-31 DIAGNOSIS — M129 Arthropathy, unspecified: Secondary | ICD-10-CM | POA: Diagnosis not present

## 2016-11-01 DIAGNOSIS — M129 Arthropathy, unspecified: Secondary | ICD-10-CM | POA: Diagnosis not present

## 2016-11-02 DIAGNOSIS — M129 Arthropathy, unspecified: Secondary | ICD-10-CM | POA: Diagnosis not present

## 2016-11-03 DIAGNOSIS — M129 Arthropathy, unspecified: Secondary | ICD-10-CM | POA: Diagnosis not present

## 2016-11-04 DIAGNOSIS — M129 Arthropathy, unspecified: Secondary | ICD-10-CM | POA: Diagnosis not present

## 2016-11-05 DIAGNOSIS — M129 Arthropathy, unspecified: Secondary | ICD-10-CM | POA: Diagnosis not present

## 2016-11-06 DIAGNOSIS — M129 Arthropathy, unspecified: Secondary | ICD-10-CM | POA: Diagnosis not present

## 2016-11-07 DIAGNOSIS — M129 Arthropathy, unspecified: Secondary | ICD-10-CM | POA: Diagnosis not present

## 2016-11-08 DIAGNOSIS — M129 Arthropathy, unspecified: Secondary | ICD-10-CM | POA: Diagnosis not present

## 2016-11-09 DIAGNOSIS — M129 Arthropathy, unspecified: Secondary | ICD-10-CM | POA: Diagnosis not present

## 2016-11-10 DIAGNOSIS — M129 Arthropathy, unspecified: Secondary | ICD-10-CM | POA: Diagnosis not present

## 2016-11-11 DIAGNOSIS — M129 Arthropathy, unspecified: Secondary | ICD-10-CM | POA: Diagnosis not present

## 2016-11-12 DIAGNOSIS — M129 Arthropathy, unspecified: Secondary | ICD-10-CM | POA: Diagnosis not present

## 2016-11-13 ENCOUNTER — Observation Stay (HOSPITAL_COMMUNITY)
Admission: EM | Admit: 2016-11-13 | Discharge: 2016-11-15 | Disposition: A | Discharge status: Home / Self Care | Payer: Medicare HMO | Attending: Internal Medicine | Admitting: Internal Medicine

## 2016-11-13 ENCOUNTER — Other Ambulatory Visit: Payer: Self-pay

## 2016-11-13 ENCOUNTER — Emergency Department (HOSPITAL_COMMUNITY): Payer: Medicare HMO

## 2016-11-13 ENCOUNTER — Encounter (HOSPITAL_COMMUNITY): Payer: Self-pay | Admitting: Emergency Medicine

## 2016-11-13 DIAGNOSIS — M129 Arthropathy, unspecified: Secondary | ICD-10-CM | POA: Diagnosis not present

## 2016-11-13 DIAGNOSIS — Z23 Encounter for immunization: Secondary | ICD-10-CM | POA: Insufficient documentation

## 2016-11-13 DIAGNOSIS — Z79899 Other long term (current) drug therapy: Secondary | ICD-10-CM | POA: Insufficient documentation

## 2016-11-13 DIAGNOSIS — Z853 Personal history of malignant neoplasm of breast: Secondary | ICD-10-CM | POA: Diagnosis not present

## 2016-11-13 DIAGNOSIS — R112 Nausea with vomiting, unspecified: Secondary | ICD-10-CM | POA: Diagnosis not present

## 2016-11-13 DIAGNOSIS — R9431 Abnormal electrocardiogram [ECG] [EKG]: Secondary | ICD-10-CM

## 2016-11-13 DIAGNOSIS — R778 Other specified abnormalities of plasma proteins: Secondary | ICD-10-CM | POA: Diagnosis present

## 2016-11-13 DIAGNOSIS — I10 Essential (primary) hypertension: Secondary | ICD-10-CM | POA: Insufficient documentation

## 2016-11-13 DIAGNOSIS — R7989 Other specified abnormal findings of blood chemistry: Secondary | ICD-10-CM

## 2016-11-13 DIAGNOSIS — Z170 Estrogen receptor positive status [ER+]: Secondary | ICD-10-CM | POA: Insufficient documentation

## 2016-11-13 DIAGNOSIS — Z7982 Long term (current) use of aspirin: Secondary | ICD-10-CM | POA: Insufficient documentation

## 2016-11-13 DIAGNOSIS — R748 Abnormal levels of other serum enzymes: Secondary | ICD-10-CM | POA: Diagnosis not present

## 2016-11-13 DIAGNOSIS — R079 Chest pain, unspecified: Secondary | ICD-10-CM

## 2016-11-13 DIAGNOSIS — C50911 Malignant neoplasm of unspecified site of right female breast: Secondary | ICD-10-CM | POA: Diagnosis present

## 2016-11-13 LAB — LIPASE, BLOOD: Lipase: 30 U/L (ref 11–51)

## 2016-11-13 LAB — CBC
HEMATOCRIT: 33.5 % — AB (ref 36.0–46.0)
Hemoglobin: 10.7 g/dL — ABNORMAL LOW (ref 12.0–15.0)
MCH: 27.9 pg (ref 26.0–34.0)
MCHC: 31.9 g/dL (ref 30.0–36.0)
MCV: 87.5 fL (ref 78.0–100.0)
Platelets: 190 10*3/uL (ref 150–400)
RBC: 3.83 MIL/uL — ABNORMAL LOW (ref 3.87–5.11)
RDW: 15 % (ref 11.5–15.5)
WBC: 4.5 10*3/uL (ref 4.0–10.5)

## 2016-11-13 LAB — I-STAT CG4 LACTIC ACID, ED: Lactic Acid, Venous: 0.49 mmol/L — ABNORMAL LOW (ref 0.5–1.9)

## 2016-11-13 LAB — COMPREHENSIVE METABOLIC PANEL
ALT: 14 U/L (ref 14–54)
AST: 26 U/L (ref 15–41)
Albumin: 4.4 g/dL (ref 3.5–5.0)
Alkaline Phosphatase: 75 U/L (ref 38–126)
Anion gap: 11 (ref 5–15)
BILIRUBIN TOTAL: 0.7 mg/dL (ref 0.3–1.2)
BUN: 36 mg/dL — AB (ref 6–20)
CHLORIDE: 98 mmol/L — AB (ref 101–111)
CO2: 27 mmol/L (ref 22–32)
CREATININE: 1.22 mg/dL — AB (ref 0.44–1.00)
Calcium: 10.3 mg/dL (ref 8.9–10.3)
GFR calc Af Amer: 43 mL/min — ABNORMAL LOW (ref >60)
GFR, EST NON AFRICAN AMERICAN: 37 mL/min — AB (ref >60)
Glucose, Bld: 112 mg/dL — ABNORMAL HIGH (ref 65–99)
Potassium: 4.7 mmol/L (ref 3.5–5.1)
Sodium: 136 mmol/L (ref 135–145)
TOTAL PROTEIN: 8.2 g/dL — AB (ref 6.5–8.1)

## 2016-11-13 LAB — TROPONIN I: Troponin I: 0.07 ng/mL (ref <0.03)

## 2016-11-13 MED ORDER — ONDANSETRON 4 MG PO TBDP
4.0000 mg | ORAL_TABLET | Freq: Once | ORAL | Status: DC | PRN
  Filled 2016-11-13: qty 1

## 2016-11-13 MED ORDER — ASPIRIN 81 MG PO CHEW
324.0000 mg | CHEWABLE_TABLET | Freq: Once | ORAL | Status: AC
  Administered 2016-11-14: 324 mg via ORAL
  Filled 2016-11-13: qty 4

## 2016-11-13 MED ORDER — SODIUM CHLORIDE 0.9 % IV BOLUS (SEPSIS)
500.0000 mL | Freq: Once | INTRAVENOUS | Status: AC
  Administered 2016-11-13: 500 mL via INTRAVENOUS

## 2016-11-13 MED ORDER — ONDANSETRON HCL 4 MG/2ML IJ SOLN
4.0000 mg | Freq: Once | INTRAMUSCULAR | Status: AC
  Administered 2016-11-13: 4 mg via INTRAVENOUS
  Filled 2016-11-13: qty 2

## 2016-11-13 NOTE — ED Notes (Signed)
Pt given new emesis bag and pt states she does feel a little better

## 2016-11-13 NOTE — ED Provider Notes (Signed)
[facility] DEPT Provider Note   CSN: [id number] Arrival date & time: 11/13/16  1746     History   Chief Complaint Chief Complaint  Patient presents with  . Emesis    HPI Angela Higgins is a 83 y.o. female.  Patient presents with nausea and vomiting that onset early this morning. States she has been "upchucking" multiple episodes of emesis all day long. Denies any diarrhea. Denies any fever. Denies abdominal pain, chest pain, shortness of breath. She is unable to quantify how much she is vomited. No sick contacts or recent travel. No recent antibiotic use. She is supposed to be on xarelto apparently she lost this prescription a while ago. Denies any chest pain or shortness of breath.     Emesis   Pertinent negatives include no abdominal pain, no arthralgias, no diarrhea, no fever, no headaches and no myalgias.    Past Medical History:  Diagnosis Date  . Breast cancer ([place])   . DVT (deep venous thrombosis) ([place])    bilateral  . Hormone receptor positive breast cancer, right ([place]) 02/12/2016  . Hypertension   . Paralysis [facility])     Patient Active Problem List   Diagnosis Date Noted  . Hormone receptor positive breast cancer, right ([place]) 02/12/2016  . DEGENERATIVE DISC DISEASE, LUMBAR SPINE 09/08/2009  . SCIATICA 09/08/2009  . SCOLIOSIS, LUMBAR SPINE 09/08/2009    Past Surgical History:  Procedure Laterality Date  . APPENDECTOMY    . HERNIA REPAIR      OB History    No data available       Home Medications    Prior to Admission medications   Medication Sig Start Date End Date Taking? Authorizing Provider  amLODipine (NORVASC) 5 MG tablet Take 5 mg by mouth daily.  01/05/16   [provider]  anastrozole (ARIMIDEX) 1 MG tablet TAKE ONE TABLET BY MOUTH ONCE DAILY. 07/09/16   Baird Cancer, PA-C  aspirin EC 81 MG tablet Take 81 mg by mouth at bedtime.    [provider]  furosemide (LASIX) 20 MG tablet Take 1 tablet (20 mg total) by mouth  daily. 06/20/16   Veryl Speak, MD  hydrOXYzine (ATARAX/VISTARIL) 25 MG tablet Take 1 tablet (25 mg total) by mouth every 6 (six) hours as needed for itching. 06/23/16   Milton Ferguson, MD  latanoprost (XALATAN) 0.005 % ophthalmic solution Place 1 drop into both eyes at bedtime.    [provider]  Multiple Vitamin (MULTIVITAMIN WITH MINERALS) TABS Take 1 tablet by mouth every morning.    [provider]  Rivaroxaban 15 & 20 MG TBPK Take as directed on package: Start with one 15mg  tablet by mouth twice a day with food. On Day 22, switch to one 20mg  tablet once a day with food. 06/20/16   Veryl Speak, MD    Family History No family history on file.  Social History Social History  Substance Use Topics  . Smoking status: Never Smoker  . Smokeless tobacco: Never Used  . Alcohol use No     Allergies   Patient has no known allergies.   Review of Systems Review of Systems  Constitutional: Negative for activity change, appetite change and fever.  HENT: Negative for congestion.   Eyes: Negative for visual disturbance.  Respiratory: Negative for chest tightness and shortness of breath.   Cardiovascular: Negative for chest pain.  Gastrointestinal: Positive for nausea and vomiting. Negative for abdominal pain and diarrhea.  Genitourinary: Negative for dysuria, hematuria, vaginal  bleeding and vaginal discharge.  Musculoskeletal: Negative for arthralgias and myalgias.  Neurological: Negative for dizziness, weakness and headaches.   all other systems are negative except as noted in the HPI and PMH.     Physical Exam Updated Vital Signs BP (!) 144/62 (BP Location: Right Arm)   Pulse (!) 59   Temp 98.1 F (36.7 C) (Oral)   Resp 16   Ht 5\' 5"  (1.651 m)   Wt 50.3 kg (111 lb)   SpO2 97%   BMI 18.47 kg/m   Physical Exam  Constitutional: She is oriented to person, place, and time. She appears well-developed and well-nourished. No distress.  HENT:  Head: Normocephalic  and atraumatic.  Mouth/Throat: Oropharynx is clear and moist. No oropharyngeal exudate.  Eyes: Pupils are equal, round, and reactive to light. Conjunctivae and EOM are normal. Right eye exhibits no discharge. Left eye exhibits no discharge.  Neck: Normal range of motion. Neck supple.  No meningismus.  Cardiovascular: Normal rate, regular rhythm, normal heart sounds and intact distal pulses.   No murmur heard. Pulmonary/Chest: Effort normal and breath sounds normal. No respiratory distress.  Abdominal: Soft. There is no tenderness. There is no rebound and no guarding.  Femoral pulses intact bilaterally  Musculoskeletal: Normal range of motion. She exhibits no edema or tenderness.  No CVAT  Neurological: She is alert and oriented to person, place, and time. No cranial nerve deficit. She exhibits normal muscle tone. Coordination normal.   5/5 strength throughout. CN 2-12 intact.Equal grip strength.   Skin: Skin is warm. Capillary refill takes less than 2 seconds.  Psychiatric: She has a normal mood and affect. Her behavior is normal.  Nursing note and vitals reviewed.    ED Treatments / Results  Labs (all labs ordered are listed, but only abnormal results are displayed) Labs Reviewed  COMPREHENSIVE METABOLIC PANEL - Abnormal; Notable for the following:       Result Value   Chloride 98 (*)    Glucose, Bld 112 (*)    BUN 36 (*)    Creatinine, Ser 1.22 (*)    Total Protein 8.2 (*)    GFR calc non Af Amer 37 (*)    GFR calc Af Amer 43 (*)    All other components within normal limits  CBC - Abnormal; Notable for the following:    RBC 3.83 (*)    Hemoglobin 10.7 (*)    HCT 33.5 (*)    All other components within normal limits  URINALYSIS, ROUTINE W REFLEX MICROSCOPIC - Abnormal; Notable for the following:    Protein, ur 30 (*)    Leukocytes, UA SMALL (*)    Squamous Epithelial / LPF 0-5 (*)    All other components within normal limits  TROPONIN I - Abnormal; Notable for the  following:    Troponin I 0.07 (*)    All other components within normal limits  APTT - Abnormal; Notable for the following:    aPTT 40 (*)    All other components within normal limits  PROTIME-INR - Abnormal; Notable for the following:    Prothrombin Time 23.9 (*)    All other components within normal limits  TROPONIN I - Abnormal; Notable for the following:    Troponin I 0.06 (*)    All other components within normal limits  I-STAT CG4 LACTIC ACID, ED - Abnormal; Notable for the following:    Lactic Acid, Venous 0.49 (*)    All other components within normal limits  LIPASE,  BLOOD  HEPARIN LEVEL (UNFRACTIONATED)  TROPONIN I  TROPONIN I    EKG  EKG Interpretation  Date/Time:  Tuesday November 13 2016 23:48:35 EDT Ventricular Rate:  66 PR Interval:    QRS Duration: 97 QT Interval:  414 QTC Calculation: 434 R Axis:   53 Text Interpretation:  Sinus rhythm Left ventricular hypertrophy Anterior infarct, acute (LAD) Confirmed by Ezequiel Essex [phone]) on 11/13/2016 11:55:47 PM       Radiology Dg Chest Port 1 View  Result Date: 11/14/2016 CLINICAL DATA:  83 year old female with chest pain. EXAM: PORTABLE CHEST 1 VIEW COMPARISON:  Chest radiograph dated 06/20/2016 FINDINGS: Mild interstitial coarsening and bibasilar, left greater right, atelectasis/scarring. No focal consolidation, pleural effusion, or pneumothorax. Stable mild cardiomegaly. There is osteopenia with scoliosis and degenerative changes of the spine. No acute osseous pathology. IMPRESSION: No active disease. Electronically Signed   By: Anner Crete M.D.   On: 11/14/2016 00:55    Procedures Procedures (including critical care time)  Medications Ordered in ED Medications  ondansetron (ZOFRAN-ODT) disintegrating tablet 4 mg (not administered)  sodium chloride 0.9 % bolus 500 mL (not administered)  ondansetron (ZOFRAN) injection 4 mg (not administered)     Initial Impression / Assessment and Plan / ED Course  I  have reviewed the triage vital signs and the nursing notes.  Pertinent labs & imaging results that were available during my care of the patient were reviewed by me and considered in my medical decision making (see chart for details).    Patient presents with 1 day of nausea and vomiting.  Denies abdominal pain, denies fever.   Patient had been waiting for 6 hours prior to evaluation. She presented with nausea but no chest pain or shortness of breath. EKG done upon my evaluation shows ST elevation in V2 and V3 without reciprocal change.  Discussed with Dr. Tamala Julian of interventional cardiology. He reviews patient's EKG. He agrees that T-wave changes and ST changes in V2 and V3 are concerning for possible ischemia or electrolyte abnormality. However, the patient has no chest pain and Dr. Tamala Julian would not activate code STEMI at this time.  Patient's abdominal exam is benign. Her LFTs and lipase are normal. Her troponin is minimally elevated. Aspirin given heparin started. She has not had her xarelto for several weeks. No chest pain or shortness of breath.  Unclear source of her nausea and vomiting. May be due to cardiac etiology or may be due to medication effect. Plan admission for further evaluation. Discussed with Dr. Marin Comment feels that she does not need to be transferred to [facility] at this time.  CRITICAL CARE Performed by: Ezequiel Essex Total critical care time: 35 minutes Critical care time was exclusive of separately billable procedures and treating other patients. Critical care was necessary to treat or prevent imminent or life-threatening deterioration. Critical care was time spent personally by me on the following activities: development of treatment plan with patient and/or surrogate as well as nursing, discussions with consultants, evaluation of patient's response to treatment, examination of patient, obtaining history from patient or surrogate, ordering and performing treatments and  interventions, ordering and review of laboratory studies, ordering and review of radiographic studies, pulse oximetry and re-evaluation of patient's condition.   Final Clinical Impressions(s) / ED Diagnoses   Final diagnoses:  Non-intractable vomiting with nausea, unspecified vomiting type  Elevated troponin  Abnormal EKG    New Prescriptions New Prescriptions   No medications on file     Ezequiel Essex, MD 11/14/16 [phone]

## 2016-11-13 NOTE — ED Triage Notes (Signed)
Onset this morning nausea and vomiting, denies pain or diarrhea

## 2016-11-14 ENCOUNTER — Observation Stay (HOSPITAL_BASED_OUTPATIENT_CLINIC_OR_DEPARTMENT_OTHER): Payer: Medicare HMO

## 2016-11-14 ENCOUNTER — Encounter (HOSPITAL_COMMUNITY): Payer: Self-pay | Admitting: Physician Assistant

## 2016-11-14 DIAGNOSIS — I10 Essential (primary) hypertension: Secondary | ICD-10-CM | POA: Diagnosis not present

## 2016-11-14 DIAGNOSIS — R7989 Other specified abnormal findings of blood chemistry: Secondary | ICD-10-CM

## 2016-11-14 DIAGNOSIS — R110 Nausea: Secondary | ICD-10-CM | POA: Diagnosis not present

## 2016-11-14 DIAGNOSIS — R112 Nausea with vomiting, unspecified: Secondary | ICD-10-CM

## 2016-11-14 DIAGNOSIS — R748 Abnormal levels of other serum enzymes: Secondary | ICD-10-CM | POA: Diagnosis not present

## 2016-11-14 DIAGNOSIS — I340 Nonrheumatic mitral (valve) insufficiency: Secondary | ICD-10-CM

## 2016-11-14 DIAGNOSIS — I82409 Acute embolism and thrombosis of unspecified deep veins of unspecified lower extremity: Secondary | ICD-10-CM | POA: Diagnosis not present

## 2016-11-14 DIAGNOSIS — R9431 Abnormal electrocardiogram [ECG] [EKG]: Secondary | ICD-10-CM | POA: Diagnosis not present

## 2016-11-14 DIAGNOSIS — R079 Chest pain, unspecified: Secondary | ICD-10-CM | POA: Diagnosis not present

## 2016-11-14 DIAGNOSIS — R778 Other specified abnormalities of plasma proteins: Secondary | ICD-10-CM | POA: Diagnosis present

## 2016-11-14 DIAGNOSIS — I351 Nonrheumatic aortic (valve) insufficiency: Secondary | ICD-10-CM

## 2016-11-14 LAB — ECHOCARDIOGRAM COMPLETE
Height: 65 in
WEIGHTICAEL: 1633.17 [oz_av]

## 2016-11-14 LAB — URINALYSIS, ROUTINE W REFLEX MICROSCOPIC
BILIRUBIN URINE: NEGATIVE
Bacteria, UA: NONE SEEN
GLUCOSE, UA: NEGATIVE mg/dL
HGB URINE DIPSTICK: NEGATIVE
KETONES UR: NEGATIVE mg/dL
NITRITE: NEGATIVE
PH: 5 (ref 5.0–8.0)
Protein, ur: 30 mg/dL — AB
SPECIFIC GRAVITY, URINE: 1.01 (ref 1.005–1.030)

## 2016-11-14 LAB — PROTIME-INR
INR: 2.1
Prothrombin Time: 23.9 seconds — ABNORMAL HIGH (ref 11.4–15.2)

## 2016-11-14 LAB — TROPONIN I
TROPONIN I: 0.05 ng/mL — AB (ref <0.03)
TROPONIN I: 0.04 ng/mL — AB (ref <0.03)
Troponin I: 0.06 ng/mL (ref <0.03)

## 2016-11-14 LAB — HEPARIN LEVEL (UNFRACTIONATED)
HEPARIN UNFRACTIONATED: 1.92 [IU]/mL — AB (ref 0.30–0.70)
Heparin Unfractionated: 1.87 IU/mL — ABNORMAL HIGH (ref 0.30–0.70)

## 2016-11-14 LAB — APTT: aPTT: 40 seconds — ABNORMAL HIGH (ref 24–36)

## 2016-11-14 MED ORDER — ASPIRIN EC 81 MG PO TBEC
81.0000 mg | DELAYED_RELEASE_TABLET | Freq: Every day | ORAL | Status: DC
  Administered 2016-11-14: 81 mg via ORAL
  Filled 2016-11-14 (×2): qty 1

## 2016-11-14 MED ORDER — HEPARIN (PORCINE) IN NACL 100-0.45 UNIT/ML-% IJ SOLN
450.0000 [IU]/h | INTRAMUSCULAR | Status: DC
  Administered 2016-11-14: 450 [IU]/h via INTRAVENOUS

## 2016-11-14 MED ORDER — ENSURE ENLIVE PO LIQD: 237.0000 mL | Freq: Two times a day (BID) | ORAL | Status: DC

## 2016-11-14 MED ORDER — ONDANSETRON HCL 4 MG/2ML IJ SOLN: 4.0000 mg | Freq: Four times a day (QID) | INTRAMUSCULAR | Status: DC | PRN

## 2016-11-14 MED ORDER — BOOST / RESOURCE BREEZE PO LIQD
1.0000 | Freq: Three times a day (TID) | ORAL | Status: DC
  Administered 2016-11-14: 1 via ORAL

## 2016-11-14 MED ORDER — ONDANSETRON HCL 4 MG PO TABS: 4.0000 mg | ORAL_TABLET | Freq: Four times a day (QID) | ORAL | Status: DC | PRN

## 2016-11-14 MED ORDER — HEPARIN (PORCINE) IN NACL 100-0.45 UNIT/ML-% IJ SOLN
600.0000 [IU]/h | INTRAMUSCULAR | Status: DC
  Administered 2016-11-14: 600 [IU]/h via INTRAVENOUS
  Filled 2016-11-14: qty 250

## 2016-11-14 MED ORDER — AMLODIPINE BESYLATE 5 MG PO TABS
5.0000 mg | ORAL_TABLET | Freq: Every day | ORAL | Status: DC
  Administered 2016-11-14 – 2016-11-15 (×2): 5 mg via ORAL
  Filled 2016-11-14 (×2): qty 1

## 2016-11-14 MED ORDER — PNEUMOCOCCAL VAC POLYVALENT 25 MCG/0.5ML IJ INJ
0.5000 mL | INJECTION | INTRAMUSCULAR | Status: AC
  Administered 2016-11-15: 0.5 mL via INTRAMUSCULAR

## 2016-11-14 MED ORDER — HEPARIN BOLUS VIA INFUSION
3000.0000 [IU] | Freq: Once | INTRAVENOUS | Status: AC
  Administered 2016-11-14: 3000 [IU] via INTRAVENOUS

## 2016-11-14 MED ORDER — ANASTROZOLE 1 MG PO TABS
1.0000 mg | ORAL_TABLET | Freq: Every day | ORAL | Status: DC
  Administered 2016-11-14 – 2016-11-15 (×2): 1 mg via ORAL
  Filled 2016-11-14 (×3): qty 1

## 2016-11-14 MED ORDER — ACETAMINOPHEN 325 MG PO TABS: 650.0000 mg | ORAL_TABLET | Freq: Four times a day (QID) | ORAL | Status: DC | PRN

## 2016-11-14 MED ORDER — LATANOPROST 0.005 % OP SOLN
1.0000 [drp] | Freq: Every day | OPHTHALMIC | Status: DC
  Administered 2016-11-14: 1 [drp] via OPHTHALMIC
  Filled 2016-11-14 (×2): qty 2.5

## 2016-11-14 MED ORDER — HEPARIN (PORCINE) IN NACL 100-0.45 UNIT/ML-% IJ SOLN: 300.0000 [IU]/h | INTRAMUSCULAR | Status: DC

## 2016-11-14 MED ORDER — ACETAMINOPHEN 650 MG RE SUPP: 650.0000 mg | Freq: Four times a day (QID) | RECTAL | Status: DC | PRN

## 2016-11-14 NOTE — Progress Notes (Signed)
ANTICOAGULATION CONSULT NOTE - Follow Up Consult  Pharmacy Consult for Heparin Indication: chest pain/ACS  No Known Allergies  Patient Measurements: Height: 5\' 5"  (165.1 cm) Weight: 102 lb 1.2 oz (46.3 kg) IBW/kg (Calculated) : 57 HEPARIN DW (KG): 50.3   Vital Signs: Temp: 98.2 F (36.8 C) (07/18 2210) Temp Source: Oral (07/18 2210) BP: 122/46 (07/18 2210) Pulse Rate: 52 (07/18 2210)  Labs:  Recent Labs  11/13/16 2019 11/14/16 0320 11/14/16 0842 11/14/16 1506 11/14/16 2009  HGB 10.7*  --   --   --   --   HCT 33.5*  --   --   --   --   PLT 190  --   --   --   --   APTT 40*  --   --   --   --   LABPROT 23.9*  --   --   --   --   INR 2.10  --   --   --   --   HEPARINUNFRC  --   --  1.87*  --  1.92*  CREATININE 1.22*  --   --   --   --   TROPONINI 0.07* 0.06* 0.05* 0.04*  --    Estimated Creatinine Clearance: 21.5 mL/min (A) (by C-G formula based on SCr of 1.22 mg/dL (H)).  Medications:  Prescriptions Prior to Admission  Medication Sig Dispense Refill Last Dose  . amLODipine (NORVASC) 5 MG tablet Take 5 mg by mouth daily.    Past Week at Unknown time  . aspirin EC 81 MG tablet Take 81 mg by mouth at bedtime.   Past Week at Unknown time  . baclofen (LIORESAL) 10 MG tablet Take 10 mg by mouth 2 (two) times daily as needed for muscle spasms (cramps).   unknown  . furosemide (LASIX) 20 MG tablet Take 1 tablet (20 mg total) by mouth daily. (Patient taking differently: Take 20 mg by mouth daily as needed. ) 15 tablet 0 unknown  . hydrOXYzine (ATARAX/VISTARIL) 25 MG tablet Take 1 tablet (25 mg total) by mouth every 6 (six) hours as needed for itching. 20 tablet 0 unknown  . lisinopril-hydrochlorothiazide (PRINZIDE,ZESTORETIC) 20-25 MG tablet Take 1 tablet by mouth daily.   Past Week at Unknown time  . anastrozole (ARIMIDEX) 1 MG tablet TAKE ONE TABLET BY MOUTH ONCE DAILY. (Patient not taking: Reported on 11/14/2016) 30 tablet 5 Not Taking at Unknown time  . Rivaroxaban 15 & 20  MG TBPK Take as directed on package: Start with one 15mg  tablet by mouth twice a day with food. On Day 22, switch to one 20mg  tablet once a day with food. (Patient not taking: Reported on 11/14/2016) 51 each 0 Not Taking at Unknown time   Assessment: Okay for Protocol, Heparin level remains above goal.  No bleeding noted.  Troponin trending down.  She is not a candidate for Xarelto due to renal function.  Alternative oral therapy would be Apixaban or Warfarin.  Goal of Therapy:  Heparin level 0.3-0.7 units/ml Monitor platelets by anticoagulation protocol: Yes   Plan:  Hold Heparin 1 hours. Reduce heparin infusion at 300 units/hr Check anti-Xa level in 8 hours and daily while on heparin Continue to monitor H&H and platelets  Pricilla Larsson 11/14/2016,10:54 PM

## 2016-11-14 NOTE — ED Notes (Signed)
Date and time results received: 11/14/16 0001 (use smartphrase ".now" to insert current time)  Test: Troponin Critical Value: 0.07  Name of Provider Notified: DR Rancour  Orders Received? Or Actions Taken?: Actions Taken: no orders received.

## 2016-11-14 NOTE — Progress Notes (Signed)
ANTICOAGULATION CONSULT NOTE - Follow Up Consult  Pharmacy Consult for Heparin Indication: chest pain/ACS  No Known Allergies  Patient Measurements: Height: 5\' 5"  (165.1 cm) Weight: 102 lb 1.2 oz (46.3 kg) IBW/kg (Calculated) : 57 HEPARIN DW (KG): 50.3   Vital Signs: Temp: 98.3 F (36.8 C) (07/18 0156) Temp Source: Oral (07/18 0156) BP: 137/60 (07/18 0156) Pulse Rate: 67 (07/18 0156)  Labs:  Recent Labs  11/13/16 2019 11/14/16 0320 11/14/16 0842  HGB 10.7*  --   --   HCT 33.5*  --   --   PLT 190  --   --   APTT 40*  --   --   LABPROT 23.9*  --   --   INR 2.10  --   --   HEPARINUNFRC  --   --  1.87*  CREATININE 1.22*  --   --   TROPONINI 0.07* 0.06* 0.05*   Estimated Creatinine Clearance: 21.5 mL/min (A) (by C-G formula based on SCr of 1.22 mg/dL (H)).  Medications:  Prescriptions Prior to Admission  Medication Sig Dispense Refill Last Dose  . amLODipine (NORVASC) 5 MG tablet Take 5 mg by mouth daily.    Taking  . anastrozole (ARIMIDEX) 1 MG tablet TAKE ONE TABLET BY MOUTH ONCE DAILY. 30 tablet 5 Taking  . aspirin EC 81 MG tablet Take 81 mg by mouth at bedtime.   Taking  . furosemide (LASIX) 20 MG tablet Take 1 tablet (20 mg total) by mouth daily. 15 tablet 0 Taking  . hydrOXYzine (ATARAX/VISTARIL) 25 MG tablet Take 1 tablet (25 mg total) by mouth every 6 (six) hours as needed for itching. 20 tablet 0 Taking  . latanoprost (XALATAN) 0.005 % ophthalmic solution Place 1 drop into both eyes at bedtime.   Taking  . Multiple Vitamin (MULTIVITAMIN WITH MINERALS) TABS Take 1 tablet by mouth every morning.   Taking  . Rivaroxaban 15 & 20 MG TBPK Take as directed on package: Start with one 15mg  tablet by mouth twice a day with food. On Day 22, switch to one 20mg  tablet once a day with food. 51 each 0 Taking   Assessment: Okay for Protocol, Heparin level above goal.  No bleeding noted.  Troponin trending down.  She is not a candidate for Xarelto due to renal function.   Alternative oral therapy would be Apixaban or Warfarin.  Goal of Therapy:  Heparin level 0.3-0.7 units/ml Monitor platelets by anticoagulation protocol: Yes   Plan:  Hold Heparin 1.5 hours. Reduce heparin infusion at 450 units/hr Check anti-Xa level in 8 hours and daily while on heparin Continue to monitor H&H and platelets  Pricilla Larsson 11/14/2016,10:02 AM

## 2016-11-14 NOTE — Care Management Note (Signed)
Case Management Note  Patient Details  Name: BOBBETTE EAKES MRN: [id number] Date of Birth: 01-26-1922  Subjective/Objective:  Adm from home with elevated troponin. From home with sister. Has aide 2-3 hours a day/7days a week. CNA and cousin at bedside offering information. Patient has PCP, sister drives her to appointments.            Action/Plan: Anticipate DC home with self care. CM following for needs.   Expected Discharge Date:      11/15/2016            Expected Discharge Plan:  Home/Self Care  In-House Referral:     Discharge planning Services  CM Consult  Post Acute Care Choice:    Choice offered to:     DME Arranged:    DME Agency:     HH Arranged:    HH Agency:     Status of Service:  In process, will continue to follow  If discussed at Long Length of Stay Meetings, dates discussed:    Additional Comments:  Kishon Garriga, Chauncey Reading, RN 11/14/2016, 2:01 PM

## 2016-11-14 NOTE — Consult Note (Addendum)
Cardiology Consultation:   Patient ID: Angela Higgins; [id number]; 06/15/1921   Admit date: 11/13/2016 Date of Consult: 11/14/2016  Primary Care Provider: Rosita Fire, MD Primary Cardiologist: New Dr. Bronson Higgins Primary Electrophysiologist:  none   Patient Profile:   Angela Higgins is a 83 y.o. female with a hx of DVT who is being seen today for the evaluation of abnormal EKG and troponins at the request of Dr. Legrand Higgins  History of Present Illness:   [name] is a 83 yr old female with history of DVT in 05/2016 on Xarelto but ran out 2 weeks ago. History of breat CA, and HTN.She presented to ED with nausea and vomiting and had elevated troponins 0.06, 0.05. EKG NSR with LVH and TWI ant/lat. No old EKG for comparison.   Patient denies any prior cardiac history. Lives with her 69 yr old sister, still does grocery shopping and remains active. Denies any chest pain, palpitations, dyspnea, dyspnea on exertion, dizziness or presyncope. CRF HTN. No DM, HLD, never smoked. Father with heart failure.  Past Medical History:  Diagnosis Date  . Breast cancer ([place])   . DVT (deep venous thrombosis) ([place])    bilateral  . Hormone receptor positive breast cancer, right ([place]) 02/12/2016  . Hypertension   . Paralysis [facility])     Past Surgical History:  Procedure Laterality Date  . APPENDECTOMY    . HERNIA REPAIR       Inpatient Medications: Scheduled Meds: . amLODipine  5 mg Oral Daily  . anastrozole  1 mg Oral Daily  . aspirin EC  81 mg Oral QHS  . feeding supplement  1 Container Oral TID BM  . latanoprost  1 drop Both Eyes QHS  . [START ON 11/15/2016] pneumococcal 23 valent vaccine  0.5 mL Intramuscular Tomorrow-1000   Continuous Infusions: . heparin     PRN Meds: acetaminophen **OR** acetaminophen, ondansetron **OR** ondansetron (ZOFRAN) IV  Allergies:   No Known Allergies  Social History:   Social History   Social History  . Marital status: Single    Spouse name: N/A  .  Number of children: N/A  . Years of education: N/A   Occupational History  . Not on file.   Social History Main Topics  . Smoking status: Never Smoker  . Smokeless tobacco: Never Used  . Alcohol use No  . Drug use: No  . Sexual activity: No   Other Topics Concern  . Not on file   Social History Narrative  . No narrative on file    Family History:   The patient's family history includes Heart failure in her father.  ROS:  Please see the history of present illness.  Review of Systems  Constitution: Negative.  HENT: Negative.   Eyes: Negative.   Cardiovascular: Negative.   Respiratory: Negative.   Hematologic/Lymphatic: Negative.   Musculoskeletal: Positive for arthritis. Negative for joint pain.  Gastrointestinal: Positive for nausea and vomiting.  Genitourinary: Negative.   Neurological: Negative.     All other ROS reviewed and negative.     Physical Exam/Data:   Vitals:   11/14/16 0000 11/14/16 0025 11/14/16 0100 11/14/16 0156  BP: (!) 139/56 (!) 138/55 (!) 155/59 137/60  Pulse:  (!) 59 70 67  Resp: 19 14 16    Temp:    98.3 F (36.8 C)  TempSrc:    Oral  SpO2: 100% 96% 99% 97%  Weight:    102 lb 1.2 oz (46.3 kg)  Height:  Intake/Output Summary (Last 24 hours) at 11/14/16 1113 Last data filed at 11/14/16 0300  Gross per 24 hour  Intake            515.1 ml  Output                0 ml  Net            515.1 ml   Filed Weights   11/13/16 1814 11/14/16 0156  Weight: 111 lb (50.3 kg) 102 lb 1.2 oz (46.3 kg)   Body mass index is 16.99 kg/m.  General: Thin, elderly in no acute distress  HEENT: normal Lymph: no adenopathy Neck: no JVD Endocrine:  No thryomegaly Vascular: No carotid bruits; FA pulses 2+ bilaterally without bruits  Cardiac:  normal S1, S2; RRR; no murmur   Lungs:  clear to auscultation bilaterally, no wheezing, rhonchi or rales  Abd: soft, nontender, no hepatomegaly  Ext: no edema Musculoskeletal:  No deformities, BUE and BLE  strength normal and equal Skin: warm and dry  Neuro:  CNs 2-12 intact, no focal abnormalities noted Psych:  Normal affect   EKG:  The EKG was personally reviewed and demonstrates:  NSR with LVH and TWI ant/lat Telemetry:  Telemetry was personally reviewed and demonstrates:  Sinus bradycardia 40-50's  Relevant CV Studies: Echo pendin  Laboratory Data:  Chemistry  Recent Labs Lab 11/13/16 2019  NA 136  K 4.7  CL 98*  CO2 27  GLUCOSE 112*  BUN 36*  CREATININE 1.22*  CALCIUM 10.3  GFRNONAA 37*  GFRAA 43*  ANIONGAP 11     Recent Labs Lab 11/13/16 2019  PROT 8.2*  ALBUMIN 4.4  AST 26  ALT 14  ALKPHOS 75  BILITOT 0.7   Hematology  Recent Labs Lab 11/13/16 2019  WBC 4.5  RBC 3.83*  HGB 10.7*  HCT 33.5*  MCV 87.5  MCH 27.9  MCHC 31.9  RDW 15.0  PLT 190   Cardiac Enzymes  Recent Labs Lab 11/13/16 2019 11/14/16 0320 11/14/16 0842  TROPONINI 0.07* 0.06* 0.05*   No results for input(s): TROPIPOC in the last 168 hours.  BNPNo results for input(s): BNP, PROBNP in the last 168 hours.  DDimer No results for input(s): DDIMER in the last 168 hours.  Radiology/Studies:  Dg Chest Port 1 View  Result Date: 11/14/2016 CLINICAL DATA:  83 year old female with chest pain. EXAM: PORTABLE CHEST 1 VIEW COMPARISON:  Chest radiograph dated 06/20/2016 FINDINGS: Mild interstitial coarsening and bibasilar, left greater right, atelectasis/scarring. No focal consolidation, pleural effusion, or pneumothorax. Stable mild cardiomegaly. There is osteopenia with scoliosis and degenerative changes of the spine. No acute osseous pathology. IMPRESSION: No active disease. Electronically Signed   By: Anner Crete M.D.   On: 11/14/2016 00:55    Assessment and Plan:   Nausea/vomiting resolved  Elevated troponins but no chest pain or cardiac complaints. EKG with LVH and TWI-await echo. Given age and lack of symptoms doubt further w/u indicated  DVT 05/2016 on Xarelto but ran out 2  weeks ago. IV heparin here  HTN takes Norvasc 5 mg on now and Lasix 20 mg(not ordered here)     Signed, Ermalinda Barrios, PA-C  11/14/2016 11:13 AM   The patient was seen and examined, and I agree with the history, physical exam, assessment and plan as documented above, with modifications as noted below. I have also personally reviewed all relevant documentation, old records, labs, and both radiographic and cardiovascular studies. I have also independently interpreted old  and new ECG's.  83 yr old woman admitted with nausea and vomiting with minimally elevated troponins and abnormal ECG. She said she has a h/o periodic nausea and vomiting. Denies antecedent chest pain, palpitations, and shortness of breath. Denies abdominal pain. Said niece was very concerned and brought her to hospital.  UA, LFTS's, and lipase all normal.  ECG shows sinus rhythm with LVH and repolarization abnormalities.  She had a 3 second asymptomatic pause at 10:28 am, but telemetry has otherwise showed sinus rhythm with PAC's.  Symptoms have resolved. Echocardiogram ordered by hospitalist but yet to be performed.  Recommendations: Would dc IV heparin and resume Xarelto for h/o DVT. I think she is stable for discharge as she is entirely asymptomatic. I will review echocardiogram once performed but this should not preclude discharge from a cardiac standpoint.  Kate Sable, MD, [facility]  11/14/2016 12:54 PM

## 2016-11-14 NOTE — Progress Notes (Signed)
Initial Nutrition Assessment  DOCUMENTATION CODES:  Underweight  INTERVENTION:  Patient is hungry and desires appetite advancement  Once diet advanced, Ensure Enlive po BID, each supplement provides 350 kcal and 20 grams of protein  NUTRITION DIAGNOSIS:  Inadequate oral intake related to nausea, vomiting as evidenced by per patient/family report of nausea and vomiting x 1 day.   GOAL:  Patient will meet greater than or equal to 90% of their needs  MONITOR:  PO intake, Supplement acceptance, Diet advancement, Labs  REASON FOR ASSESSMENT:  Malnutrition Screening Tool    ASSESSMENT:  83 y/o female PMHx CAD, HTN Breast Cancer, DVT,L side Paralysis. Presents with acute onset N/V. Worked up for slight elevation of tropinin and abnormal EKG. Admitted for cardiology evaluation.   Pt's report hindered by being difficult to understand. She says her n/v only went on for 1 day. She was at baseline prior. She says she really did not even feel bad when she had the nausea and vomiting and it was her sisters "who were nervous and made me come to the hospital".   She didn't follow any diet at home and "ate what I want". However, she says she doesn't use any salt. She had recently started drinking Ensure.   When asked what her UBW was she said 40. When RD said she is 102 now, she restates, "oh, yah 102, that sounds right". She does not seem to know what her UBW is. She reported her weight "droppped off" when her dad passed, but did not say when that was. She says her weight has been stable "until this".   Per chart review of wt history, she has lost 9 lbs in the last 2 months and maybe 8 lbs in the last 6 months. UBW appears to be 110-115 lbs.    At this time, she is hungry. She is asking why she is still on clear liquids and desires diet advancement. She was agreeable to supplementation.   NFPE: Mild to moderate muscle/fat wasting, however, likely age related sarcopenia more so than  malnutrition  Labs:Glu: 112, Creat: 1.22.  Meds: Boost breeze, arimdex, IVF   Recent Labs Lab 11/13/16 2019  NA 136  K 4.7  CL 98*  CO2 27  BUN 36*  CREATININE 1.22*  CALCIUM 10.3  GLUCOSE 112*   Diet Order:  Diet clear liquid Room service appropriate? Yes; Fluid consistency: Thin  Skin:  Reviewed, no issues  Last BM:  7/17  Height:  Ht Readings from Last 1 Encounters:  11/13/16 5\' 5"  (1.651 m)   Weight:  Wt Readings from Last 1 Encounters:  11/14/16 102 lb 1.2 oz (46.3 kg)   Wt Readings from Last 10 Encounters:  11/14/16 102 lb 1.2 oz (46.3 kg)  09/20/16 111 lb 3.2 oz (50.4 kg)  06/23/16 120 lb (54.4 kg)  06/20/16 110 lb (49.9 kg)  06/15/16 119 lb 6.4 oz (54.2 kg)  05/04/16 108 lb (49 kg)  03/21/16 109 lb 1.6 oz (49.5 kg)  02/08/16 110 lb (49.9 kg)  07/19/12 116 lb (52.6 kg)  09/08/09 116 lb (52.6 kg)   Ideal Body Weight:  56.82 kg  BMI:  Body mass index is 16.99 kg/m.  Estimated Nutritional Needs:  Kcal:  1400-1600 (30-35 kcal/kg bw) Protein:  56-65g Pro (1.2-1.4 g/kg bw) Fluid:  >1.2 L fluid (25 ml/kg bw)  EDUCATION NEEDS:  No education needs identified at this time  Burtis Junes RD, LDN, [place] [facility] Pager: [phone] 11/14/2016 12:30 PM

## 2016-11-14 NOTE — H&P (Signed)
History and Physical    MARSHA Higgins FGH:[id number] DOB: 07/24/1921 DOA: 11/13/2016  PCP: Rosita Fire, MD  Patient coming from: Home.    Chief Complaint:  Nausea and vomiting.   HPI: Angela Higgins is an 83 y.o. female with no known CAD, hx of breast CA,  Hx of DVT supposed to be on Rivaroxiban but she was out of prescription for 2 weeks, hx of left sided paralysis, presented to the ER with sudden onset of nausea and vomiting.  She has no abdominal or chest pain.  She denied SOB, fever, chills, black or bloody stool.  Work up in the ER with EKG showing slight upslopping tof the ST segments, along with T wave inversions.  It was new according to the EDP, but the comparison EKG was more than 10 years ago.  Her troponin was slightly elevated to 0.07.  Hospitalist was asked to admit her for new EKG changes, and slight elevation of her troponin. Her Cr was 1.22, and her Hb was 10.7.  She was started on IV Heparin. She lives with her sister who has no GI symptom.  She was started on a new medication for RLS, but we are unsure what medicine it was.   ED Course:  See above.  Past Medical History:  Diagnosis Date  . Breast cancer ([place])   . DVT (deep venous thrombosis) ([place])    bilateral  . Hormone receptor positive breast cancer, right ([place]) 02/12/2016  . Hypertension   . Paralysis ([place])     Rewiew of Systems:  Constitutional: Negative for malaise, fever and chills. No significant weight loss or weight gain Eyes: Negative for eye pain, redness and discharge, diplopia, visual changes, or flashes of light. ENMT: Negative for ear pain, hoarseness, nasal congestion, sinus pressure and sore throat. No headaches; tinnitus, drooling, or problem swallowing. Cardiovascular: Negative for chest pain, palpitations, diaphoresis, dyspnea and peripheral edema. ; No orthopnea, PND Respiratory: Negative for cough, hemoptysis, wheezing and stridor. No pleuritic chestpain. Gastrointestinal: Negative for   diarrhea, constipation, abdominal pain, melena, blood in stool, hematemesis, jaundice and rectal bleeding.    Genitourinary: Negative for frequency, dysuria, incontinence,flank pain and hematuria; Musculoskeletal: Negative for back pain and neck pain. Negative for swelling and trauma.;  Skin: . Negative for pruritus, rash, abrasions, bruising and skin lesion.; ulcerations Neuro: Negative for headache, lightheadedness and neck stiffness. Negative for weakness, altered level of consciousness , altered mental status, extremity weakness, burning feet, involuntary movement, seizure and syncope.  Psych: negative for anxiety, depression, insomnia, tearfulness, panic attacks, hallucinations, paranoia, suicidal or homicidal ideation Past Surgical History:  Procedure Laterality Date  . APPENDECTOMY    . HERNIA REPAIR       reports that she has never smoked. She has never used smokeless tobacco. She reports that she does not drink alcohol or use drugs.  No Known Allergies  No family history on file.   Prior to Admission medications   Medication Sig Start Date End Date Taking? Authorizing Provider  amLODipine (NORVASC) 5 MG tablet Take 5 mg by mouth daily.  01/05/16   [provider]  anastrozole (ARIMIDEX) 1 MG tablet TAKE ONE TABLET BY MOUTH ONCE DAILY. 07/09/16   Baird Cancer, PA-C  aspirin EC 81 MG tablet Take 81 mg by mouth at bedtime.    [provider]  furosemide (LASIX) 20 MG tablet Take 1 tablet (20 mg total) by mouth daily. 06/20/16   Veryl Speak, MD  hydrOXYzine (ATARAX/VISTARIL) 25 MG tablet  Take 1 tablet (25 mg total) by mouth every 6 (six) hours as needed for itching. 06/23/16   Milton Ferguson, MD  latanoprost (XALATAN) 0.005 % ophthalmic solution Place 1 drop into both eyes at bedtime.    [provider]  Multiple Vitamin (MULTIVITAMIN WITH MINERALS) TABS Take 1 tablet by mouth every morning.    [provider]  Rivaroxaban 15 & 20 MG TBPK Take as  directed on package: Start with one 15mg  tablet by mouth twice a day with food. On Day 22, switch to one 20mg  tablet once a day with food. 06/20/16   Veryl Speak, MD    Physical Exam: Vitals:   11/13/16 2125 11/13/16 2320 11/14/16 0000 11/14/16 0025  BP: (!) 144/62 (!) 157/49 (!) 139/56 (!) 138/55  Pulse: (!) 59 95  (!) 59  Resp: 16 18 19 14   Temp: 98.1 F (36.7 C)     TempSrc: Oral     SpO2: 97% 100% 100% 96%  Weight:      Height:       Constitutional: NAD, calm, comfortable Vitals:   11/13/16 2125 11/13/16 2320 11/14/16 0000 11/14/16 0025  BP: (!) 144/62 (!) 157/49 (!) 139/56 (!) 138/55  Pulse: (!) 59 95  (!) 59  Resp: 16 18 19 14   Temp: 98.1 F (36.7 C)     TempSrc: Oral     SpO2: 97% 100% 100% 96%  Weight:      Height:       Eyes: PERRL, lids and conjunctivae normal ENMT: Mucous membranes are moist. Posterior pharynx clear of any exudate or lesions.Normal dentition.  Neck: normal, supple, no masses, no thyromegaly Respiratory: clear to auscultation bilaterally, no wheezing, no crackles. Normal respiratory effort. No accessory muscle use.  Cardiovascular: Regular rate and rhythm, no murmurs / rubs / gallops. No extremity edema. 2+ pedal pulses. No carotid bruits.  Abdomen: no tenderness, no masses palpated. No hepatosplenomegaly. Bowel sounds positive.  Musculoskeletal: no clubbing / cyanosis. No joint deformity upper and lower extremities. Good ROM, no contractures. Normal muscle tone.  Skin: no rashes, lesions, ulcers. No induration Neurologic: CN 2-12 grossly intact. Sensation intact, DTR normal. Strength 5/5 in all 4.  Psychiatric: Normal judgment and insight. Alert and oriented x 3. Normal mood.   Labs on Admission: I have personally reviewed following labs and imaging studies CBC:  Recent Labs Lab 11/13/16 2019  WBC 4.5  HGB 10.7*  HCT 33.5*  MCV 87.5  PLT 885   Basic Metabolic Panel:  Recent Labs Lab 11/13/16 2019  NA 136  K 4.7  CL 98*  CO2 27    GLUCOSE 112*  BUN 36*  CREATININE 1.22*  CALCIUM 10.3   GFR: Estimated Creatinine Clearance: 23.4 mL/min (A) (by C-G formula based on SCr of 1.22 mg/dL (H)). Liver Function Tests:  Recent Labs Lab 11/13/16 2019  AST 26  ALT 14  ALKPHOS 75  BILITOT 0.7  PROT 8.2*  ALBUMIN 4.4    Recent Labs Lab 11/13/16 2019  LIPASE 30   Coagulation Profile:  Recent Labs Lab 11/13/16 2019  INR 2.10   Cardiac Enzymes:  Recent Labs Lab 11/13/16 2019  TROPONINI 0.07*   Radiological Exams on Admission: Dg Chest Port 1 View  Result Date: 11/14/2016 CLINICAL DATA:  83 year old female with chest pain. EXAM: PORTABLE CHEST 1 VIEW COMPARISON:  Chest radiograph dated 06/20/2016 FINDINGS: Mild interstitial coarsening and bibasilar, left greater right, atelectasis/scarring. No focal consolidation, pleural effusion, or pneumothorax. Stable mild cardiomegaly. There is  osteopenia with scoliosis and degenerative changes of the spine. No acute osseous pathology. IMPRESSION: No active disease. Electronically Signed   By: Anner Crete M.D.   On: 11/14/2016 00:55   EKG: Independently reviewed.   Assessment/Plan Active Problems:   Hormone receptor positive breast cancer, right (H[facility])   Elevated troponin   Nausea & vomiting   PLAN:   Nausea and vomiting:  Unclear exact etiology.  LFT and Lipase was normal.  Abdomen is benign.  Will Tx symptomatically.  No diarrhea.  Possible medication side effect just started.    Elevated Troponin:  Will cycle.  Given her requirement for resuming her Rivaroxaban, will continue with IV Heparin for now.  Continue with cycling her troponin, and obtain cardiac ECHO.  Will continue with her ASA.     DVT prophylaxis: IV Heparin.  Code Status: FULL CODE.  This was reconfirmed.  Family Communication: 2 sister at bedside.  Disposition Plan: Home.  Consults called: None.  Admission status: OBS>    Oliverio Cho MD FACP. [facility]  If 7PM-7AM, please  contact night-coverage [email or web address] Password [email or web address]  11/14/2016, 1:05 AM

## 2016-11-14 NOTE — Progress Notes (Signed)
*  PRELIMINARY RESULTS* Echocardiogram 2D Echocardiogram has been performed.  Leavy Cella 11/14/2016, 2:35 PM

## 2016-11-14 NOTE — Progress Notes (Signed)
ANTICOAGULATION CONSULT NOTE - Preliminary  Pharmacy Consult for heparin Indication: chest pain/ACS  No Known Allergies  Patient Measurements: Height: 5\' 5"  (165.1 cm) Weight: 111 lb (50.3 kg) IBW/kg (Calculated) : 57 HEPARIN DW (KG): 50.3   Vital Signs: Temp: 98.1 F (36.7 C) (07/17 2125) Temp Source: Oral (07/17 2125) BP: 157/49 (07/17 2320) Pulse Rate: 95 (07/17 2320)  Labs:  Recent Labs  11/13/16 2019  HGB 10.7*  HCT 33.5*  PLT 190  CREATININE 1.22*  TROPONINI 0.07*   Estimated Creatinine Clearance: 23.4 mL/min (A) (by C-G formula based on SCr of 1.22 mg/dL (H)).  Medical History: Past Medical History:  Diagnosis Date  . Breast cancer ([place])   . DVT (deep venous thrombosis) ([place])    bilateral  . Hormone receptor positive breast cancer, right ([place]) 02/12/2016  . Hypertension   . Paralysis ([place])     Medications:   (Not in a hospital admission) Scheduled:   Infusions:  . sodium chloride 500 mL (11/13/16 2342)   PRN: ondansetron Anti-infectives    None      Assessment: 83 yo female with hx of DVT. Pt supposed to be taking Xarelto, however per MD lost script a while ago.  Also verified with patient she has not been taking.  Starting heparin for ST changes on EKG.  CBC done, other baseline labs pending.   Goal of Therapy:  Heparin level 0.3-0.7 units/ml   Plan:  Give 3000 units bolus x 1 Start heparin infusion at 600 units/hr Check anti-Xa level in 8 hours and daily while on heparin Preliminary review of pertinent patient information completed.  Forestine Na clinical pharmacist will complete review during morning rounds to assess the patient and finalize treatment regimen.  Nyra Capes, [facility] 11/14/2016,12:07 AM

## 2016-11-14 NOTE — Progress Notes (Signed)
Subjective: Patient was admitted due to nausea and vomiting. Her troponin is elevated and also EKG was abnormal ( T-wave changes). However, patient has no chest pain. Her nausea and vomiting is improving. She was started on heparin drip.  Objective: Vital signs in last 24 hours: Temp:  [98.1 F (36.7 C)-98.3 F (36.8 C)] 98.3 F (36.8 C) (07/18 0156) Pulse Rate:  [59-95] 67 (07/18 0156) Resp:  [14-19] 16 (07/18 0100) BP: (137-178)/(49-90) 137/60 (07/18 0156) SpO2:  [96 %-100 %] 97 % (07/18 0156) Weight:  [46.3 kg (102 lb 1.2 oz)-50.3 kg (111 lb)] 46.3 kg (102 lb 1.2 oz) (07/18 0156) Weight change:  Last BM Date: 11/13/16  Intake/Output from previous day: 07/17 0701 - 07/18 0700 In: 515.1 [I.V.:15.1; IV Piggyback:500] Out: -   PHYSICAL EXAM General appearance: alert and no distress Resp: clear to auscultation bilaterally Cardio: S1, S2 normal GI: soft, non-tender; bowel sounds normal; no masses,  no organomegaly Extremities: left side hemiplegia  Lab Results:  Results for orders placed or performed during the hospital encounter of 11/13/16 (from the past 48 hour(s))  Lipase, blood     Status: None   Collection Time: 11/13/16  8:19 PM  Result Value Ref Range   Lipase 30 11 - 51 U/L  Comprehensive metabolic panel     Status: Abnormal   Collection Time: 11/13/16  8:19 PM  Result Value Ref Range   Sodium 136 135 - 145 mmol/L   Potassium 4.7 3.5 - 5.1 mmol/L   Chloride 98 (L) 101 - 111 mmol/L   CO2 27 22 - 32 mmol/L   Glucose, Bld 112 (H) 65 - 99 mg/dL   BUN 36 (H) 6 - 20 mg/dL   Creatinine, Ser 1.22 (H) 0.44 - 1.00 mg/dL   Calcium 10.3 8.9 - 10.3 mg/dL   Total Protein 8.2 (H) 6.5 - 8.1 g/dL   Albumin 4.4 3.5 - 5.0 g/dL   AST 26 15 - 41 U/L   ALT 14 14 - 54 U/L   Alkaline Phosphatase 75 38 - 126 U/L   Total Bilirubin 0.7 0.3 - 1.2 mg/dL   GFR calc non Af Amer 37 (L) >60 mL/min   GFR calc Af Amer 43 (L) >60 mL/min    Comment: (NOTE) The eGFR has been calculated using  the CKD EPI equation. This calculation has not been validated in all clinical situations. eGFR's persistently <60 mL/min signify possible Chronic Kidney Disease.    Anion gap 11 5 - 15  CBC     Status: Abnormal   Collection Time: 11/13/16  8:19 PM  Result Value Ref Range   WBC 4.5 4.0 - 10.5 K/uL   RBC 3.83 (L) 3.87 - 5.11 MIL/uL   Hemoglobin 10.7 (L) 12.0 - 15.0 g/dL   HCT 33.5 (L) 36.0 - 46.0 %   MCV 87.5 78.0 - 100.0 fL   MCH 27.9 26.0 - 34.0 pg   MCHC 31.9 30.0 - 36.0 g/dL   RDW 15.0 11.5 - 15.5 %   Platelets 190 150 - 400 K/uL  Troponin I     Status: Abnormal   Collection Time: 11/13/16  8:19 PM  Result Value Ref Range   Troponin I 0.07 (HH) <0.03 ng/mL    Comment: CRITICAL RESULT CALLED TO, READ BACK BY AND VERIFIED WITH:  BELTON,C @ 2141 ON 11/13/16 BY JUW   APTT     Status: Abnormal   Collection Time: 11/13/16  8:19 PM  Result Value Ref Range   aPTT  40 (H) 24 - 36 seconds    Comment:        IF BASELINE aPTT IS ELEVATED, SUGGEST PATIENT RISK ASSESSMENT BE USED TO DETERMINE APPROPRIATE ANTICOAGULANT THERAPY.   Protime-INR     Status: Abnormal   Collection Time: 11/13/16  8:19 PM  Result Value Ref Range   Prothrombin Time 23.9 (H) 11.4 - 15.2 seconds   INR 2.10   I-Stat CG4 Lactic Acid, ED     Status: Abnormal   Collection Time: 11/13/16 11:53 PM  Result Value Ref Range   Lactic Acid, Venous 0.49 (L) 0.5 - 1.9 mmol/L  Urinalysis, Routine w reflex microscopic     Status: Abnormal   Collection Time: 11/14/16  1:10 AM  Result Value Ref Range   Color, Urine YELLOW YELLOW   APPearance CLEAR CLEAR   Specific Gravity, Urine 1.010 1.005 - 1.030   pH 5.0 5.0 - 8.0   Glucose, UA NEGATIVE NEGATIVE mg/dL   Hgb urine dipstick NEGATIVE NEGATIVE   Bilirubin Urine NEGATIVE NEGATIVE   Ketones, ur NEGATIVE NEGATIVE mg/dL   Protein, ur 30 (A) NEGATIVE mg/dL   Nitrite NEGATIVE NEGATIVE   Leukocytes, UA SMALL (A) NEGATIVE   RBC / HPF 0-5 0 - 5 RBC/hpf   WBC, UA 6-30 0 - 5  WBC/hpf   Bacteria, UA NONE SEEN NONE SEEN   Squamous Epithelial / LPF 0-5 (A) NONE SEEN  Troponin I     Status: Abnormal   Collection Time: 11/14/16  3:20 AM  Result Value Ref Range   Troponin I 0.06 (HH) <0.03 ng/mL    Comment: CRITICAL VALUE NOTED.  VALUE IS CONSISTENT WITH PREVIOUSLY REPORTED AND CALLED VALUE.    ABGS No results for input(s): PHART, PO2ART, TCO2, HCO3 in the last 72 hours.  Invalid input(s): PCO2 CULTURES No results found for this or any previous visit (from the past 240 hour(s)). Studies/Results: Dg Chest Port 1 View  Result Date: 11/14/2016 CLINICAL DATA:  83 year old female with chest pain. EXAM: PORTABLE CHEST 1 VIEW COMPARISON:  Chest radiograph dated 06/20/2016 FINDINGS: Mild interstitial coarsening and bibasilar, left greater right, atelectasis/scarring. No focal consolidation, pleural effusion, or pneumothorax. Stable mild cardiomegaly. There is osteopenia with scoliosis and degenerative changes of the spine. No acute osseous pathology. IMPRESSION: No active disease. Electronically Signed   By: Angela Higgins M.D.   On: 11/14/2016 00:55    Medications: I have reviewed the patient's current medications.  Assesment:  Active Problems:   Hormone receptor positive breast cancer, right (HCC)   Elevated troponin   Nausea & vomiting    Plan:  Medications reviewed Will continue heparin drip for now  Cardiology consult Clear liquid diet    LOS: 0 days   Angela Higgins 11/14/2016, 8:15 AM

## 2016-11-14 NOTE — Care Management Obs Status (Signed)
[place] NOTIFICATION   Patient Details  Name: LYDIANN BONIFAS MRN: [id number] Date of Birth: [birth year]   Medicare Observation Status Notification Given:  Yes    Jonovan Boedecker, Chauncey Reading, RN 11/14/2016, 2:40 PM

## 2016-11-15 DIAGNOSIS — I82409 Acute embolism and thrombosis of unspecified deep veins of unspecified lower extremity: Secondary | ICD-10-CM | POA: Diagnosis not present

## 2016-11-15 DIAGNOSIS — I10 Essential (primary) hypertension: Secondary | ICD-10-CM | POA: Diagnosis not present

## 2016-11-15 DIAGNOSIS — R112 Nausea with vomiting, unspecified: Secondary | ICD-10-CM | POA: Diagnosis not present

## 2016-11-15 DIAGNOSIS — R110 Nausea: Secondary | ICD-10-CM | POA: Diagnosis not present

## 2016-11-15 LAB — HEPARIN LEVEL (UNFRACTIONATED): Heparin Unfractionated: 0.91 IU/mL — ABNORMAL HIGH (ref 0.30–0.70)

## 2016-11-15 LAB — CBC
HEMATOCRIT: 31.7 % — AB (ref 36.0–46.0)
Hemoglobin: 10.2 g/dL — ABNORMAL LOW (ref 12.0–15.0)
MCH: 28.5 pg (ref 26.0–34.0)
MCHC: 32.2 g/dL (ref 30.0–36.0)
MCV: 88.5 fL (ref 78.0–100.0)
Platelets: 190 10*3/uL (ref 150–400)
RBC: 3.58 MIL/uL — AB (ref 3.87–5.11)
RDW: 14.8 % (ref 11.5–15.5)
WBC: 4.2 10*3/uL (ref 4.0–10.5)

## 2016-11-15 LAB — APTT: aPTT: 50 seconds — ABNORMAL HIGH (ref 24–36)

## 2016-11-15 MED ORDER — LATANOPROST 0.005 % OP SOLN: 1.0000 [drp] | Freq: Every day | OPHTHALMIC | 12 refills | Status: DC

## 2016-11-15 NOTE — Discharge Summary (Signed)
Physician Discharge Summary  Patient ID: Angela Higgins MRN: [id number] DOB/AGE: 1921/08/11 83 y.o. Primary Care Physician:Letisha Yera, MD Admit date: 11/13/2016 Discharge date: 11/15/2016    Discharge Diagnoses:   Active Problems:   Hormone receptor positive breast cancer, right (HCC)   Elevated troponin   Nausea & vomiting DVT Hypertension  Allergies as of 11/15/2016   No Known Allergies     Medication List    STOP taking these medications   furosemide 20 MG tablet Commonly known as:  LASIX   hydrOXYzine 25 MG tablet Commonly known as:  ATARAX/VISTARIL     TAKE these medications   amLODipine 5 MG tablet Commonly known as:  NORVASC Take 5 mg by mouth daily.   anastrozole 1 MG tablet Commonly known as:  ARIMIDEX TAKE ONE TABLET BY MOUTH ONCE DAILY.   aspirin EC 81 MG tablet Take 81 mg by mouth at bedtime.   baclofen 10 MG tablet Commonly known as:  LIORESAL Take 10 mg by mouth 2 (two) times daily as needed for muscle spasms (cramps).   latanoprost 0.005 % ophthalmic solution Commonly known as:  XALATAN Place 1 drop into both eyes at bedtime.   lisinopril-hydrochlorothiazide 20-25 MG tablet Commonly known as:  PRINZIDE,ZESTORETIC Take 1 tablet by mouth daily.   Rivaroxaban 15 & 20 MG Tbpk Take as directed on package: Start with one 15mg  tablet by mouth twice a day with food. On Day 22, switch to one 20mg  tablet once a day with food.       Discharged Condition: improved    Consults: cardiology  Significant Diagnostic Studies: Dg Chest Port 1 View  Result Date: 11/14/2016 CLINICAL DATA:  83 year old female with chest pain. EXAM: PORTABLE CHEST 1 VIEW COMPARISON:  Chest radiograph dated 06/20/2016 FINDINGS: Mild interstitial coarsening and bibasilar, left greater right, atelectasis/scarring. No focal consolidation, pleural effusion, or pneumothorax. Stable mild cardiomegaly. There is osteopenia with scoliosis and degenerative changes of the spine.  No acute osseous pathology. IMPRESSION: No active disease. Electronically Signed   By: Anner Crete M.D.   On: 11/14/2016 00:55    Lab Results: Basic Metabolic Panel:  Recent Labs  11/13/16 2019  NA 136  K 4.7  CL 98*  CO2 27  GLUCOSE 112*  BUN 36*  CREATININE 1.22*  CALCIUM 10.3   Liver Function Tests:  Recent Labs  11/13/16 2019  AST 26  ALT 14  ALKPHOS 75  BILITOT 0.7  PROT 8.2*  ALBUMIN 4.4     CBC:  Recent Labs  11/13/16 2019  WBC 4.5  HGB 10.7*  HCT 33.5*  MCV 87.5  PLT 190    No results found for this or any previous visit (from the past 240 hour(s)).   Hospital Course:   This is a 83 years old female with history of multiple medical illnesses was admitted due to nausea and vomiting. Her troponin was slightly elevated and also has T wave abnormality on her EKG. Patient was evaluated by cardiology and ECHO was done. Her echo was normal and has no chest pain or other cardiac issue. She is restarted on anticoagulation for her DVT. Patient is discharged home in stable condition.  Discharge Exam: Blood pressure (!) 136/50, pulse (!) 46, temperature 98.3 F (36.8 C), temperature source Oral, resp. rate 18, height 5\' 5"  (1.651 m), weight 46.3 kg (102 lb 1.2 oz), SpO2 100 %.    Disposition:  home      Signed: Jadelynn Boylan   11/15/2016, 8:29 AM

## 2016-11-15 NOTE — Progress Notes (Signed)
Patient discharged with instructions, prescription, and care notes.  Verbalized understanding via teach back.  IV was removed and the site was WNL. Patient voiced no further complaints or concerns at the time of discharge.  Appointments scheduled per instructions.  Patient left the floor via w/c family  And staff in stable condition. 

## 2016-11-15 NOTE — Progress Notes (Signed)
MT has notified this RN multiple times regarding sinus pauses in patient's heartbeat. This is ongoing issue, not new this shift. Patient sleeping at these times. Will notify oncoming RN. Will continue to monitor.

## 2016-11-15 NOTE — Discharge Instructions (Signed)
Cardiac-Specific Troponin I and T Test °Why am I having this test? °You may have this test if you have experienced chest pain. The test can be used to determine if you have had a heart attack or injury to heart (cardiac) muscle. This test can also help predict the possibility of future heart attacks. °This test measures the concentration of cardiac-specific troponin in your blood. Troponins are proteins that help muscles contract. There are three forms of troponin, including troponins C, I, and T. The types of troponins I and T that are found in cardiac muscle are different from the troponins I and T that are found in skeletal muscle. Therefore, testing can be done for cardiac-specific troponins I and T. These types of troponin are normally present in very small quantities in the blood. When there is damage to heart muscle cells, cardiac troponins I and T are released into circulation. The more damage there is, the greater the concentration of troponins I and T. When a person has a heart attack, levels of troponin can become elevated in the blood within 3-4 hours after injury and may remain elevated for 10-14 days. °What kind of sample is taken? °A blood sample is required for this test. It is usually collected by inserting a needle into a vein. Usually, an initial blood sample is collected, and then another blood sample is collected 12 hours later. After these samples, you will have your blood tested daily for 3-5 days. You might also have it tested weekly for 5-6 weeks. °How do I prepare for this test? °There is no preparation required for this test. However, be aware that you will need to make arrangements to have your blood collected frequently. °What are the reference ranges? °Reference values are considered healthy values established after testing a large group of healthy people. Reference values may vary among different people, labs, and hospitals. It is your responsibility to obtain your test results. Ask  the lab or department performing the test when and how you will get your results. °Reference values for cardiac troponins are as follows: °· Cardiac troponin T: less than 0.1 ng/mL. °· Cardiac troponin I: less than 0.03 ng/mL. ° °What do the results mean? °Troponin values above the reference values may indicate: °· Injury to the heart muscle. °· Heart attack. ° °Talk with your health care provider to discuss your results, treatment options, and if necessary, the need for more tests. Talk with your health care provider if you have any questions about your results. °Talk with your health care provider to discuss your results, treatment options, and if necessary, the need for more tests. Talk with your health care provider if you have any questions about your results. °This information is not intended to replace advice given to you by your health care provider. Make sure you discuss any questions you have with your health care provider. °Document Released: 05/19/2004 Document Revised: 12/20/2015 Document Reviewed: 09/09/2013 °Elsevier Interactive Patient Education © 2018 [employer] ° °

## 2016-11-15 NOTE — Progress Notes (Signed)
Updated the patients niece in regards to the patients care with the patients permission.

## 2016-11-16 DIAGNOSIS — M129 Arthropathy, unspecified: Secondary | ICD-10-CM | POA: Diagnosis not present

## 2016-11-17 DIAGNOSIS — M129 Arthropathy, unspecified: Secondary | ICD-10-CM | POA: Diagnosis not present

## 2016-11-18 DIAGNOSIS — M129 Arthropathy, unspecified: Secondary | ICD-10-CM | POA: Diagnosis not present

## 2016-11-19 DIAGNOSIS — M129 Arthropathy, unspecified: Secondary | ICD-10-CM | POA: Diagnosis not present

## 2016-11-20 DIAGNOSIS — M129 Arthropathy, unspecified: Secondary | ICD-10-CM | POA: Diagnosis not present

## 2016-11-21 DIAGNOSIS — M129 Arthropathy, unspecified: Secondary | ICD-10-CM | POA: Diagnosis not present

## 2016-11-22 DIAGNOSIS — G8190 Hemiplegia, unspecified affecting unspecified side: Secondary | ICD-10-CM | POA: Diagnosis not present

## 2016-11-22 DIAGNOSIS — R112 Nausea with vomiting, unspecified: Secondary | ICD-10-CM | POA: Diagnosis not present

## 2016-11-22 DIAGNOSIS — M129 Arthropathy, unspecified: Secondary | ICD-10-CM | POA: Diagnosis not present

## 2016-11-22 DIAGNOSIS — I82401 Acute embolism and thrombosis of unspecified deep veins of right lower extremity: Secondary | ICD-10-CM | POA: Diagnosis not present

## 2016-11-23 DIAGNOSIS — M129 Arthropathy, unspecified: Secondary | ICD-10-CM | POA: Diagnosis not present

## 2016-11-24 DIAGNOSIS — M129 Arthropathy, unspecified: Secondary | ICD-10-CM | POA: Diagnosis not present

## 2016-11-25 DIAGNOSIS — M129 Arthropathy, unspecified: Secondary | ICD-10-CM | POA: Diagnosis not present

## 2016-11-26 DIAGNOSIS — M129 Arthropathy, unspecified: Secondary | ICD-10-CM | POA: Diagnosis not present

## 2016-11-27 DIAGNOSIS — M129 Arthropathy, unspecified: Secondary | ICD-10-CM | POA: Diagnosis not present

## 2016-11-28 DIAGNOSIS — M129 Arthropathy, unspecified: Secondary | ICD-10-CM | POA: Diagnosis not present

## 2016-11-29 DIAGNOSIS — M129 Arthropathy, unspecified: Secondary | ICD-10-CM | POA: Diagnosis not present

## 2016-11-30 DIAGNOSIS — M129 Arthropathy, unspecified: Secondary | ICD-10-CM | POA: Diagnosis not present

## 2016-12-01 DIAGNOSIS — M129 Arthropathy, unspecified: Secondary | ICD-10-CM | POA: Diagnosis not present

## 2016-12-02 DIAGNOSIS — M129 Arthropathy, unspecified: Secondary | ICD-10-CM | POA: Diagnosis not present

## 2016-12-03 DIAGNOSIS — M129 Arthropathy, unspecified: Secondary | ICD-10-CM | POA: Diagnosis not present

## 2016-12-04 DIAGNOSIS — M129 Arthropathy, unspecified: Secondary | ICD-10-CM | POA: Diagnosis not present

## 2016-12-05 DIAGNOSIS — M129 Arthropathy, unspecified: Secondary | ICD-10-CM | POA: Diagnosis not present

## 2016-12-06 DIAGNOSIS — M129 Arthropathy, unspecified: Secondary | ICD-10-CM | POA: Diagnosis not present

## 2016-12-07 DIAGNOSIS — M129 Arthropathy, unspecified: Secondary | ICD-10-CM | POA: Diagnosis not present

## 2016-12-08 DIAGNOSIS — M129 Arthropathy, unspecified: Secondary | ICD-10-CM | POA: Diagnosis not present

## 2016-12-09 DIAGNOSIS — M129 Arthropathy, unspecified: Secondary | ICD-10-CM | POA: Diagnosis not present

## 2016-12-10 DIAGNOSIS — M129 Arthropathy, unspecified: Secondary | ICD-10-CM | POA: Diagnosis not present

## 2016-12-11 DIAGNOSIS — M129 Arthropathy, unspecified: Secondary | ICD-10-CM | POA: Diagnosis not present

## 2016-12-12 DIAGNOSIS — M129 Arthropathy, unspecified: Secondary | ICD-10-CM | POA: Diagnosis not present

## 2016-12-13 DIAGNOSIS — M129 Arthropathy, unspecified: Secondary | ICD-10-CM | POA: Diagnosis not present

## 2016-12-14 DIAGNOSIS — M129 Arthropathy, unspecified: Secondary | ICD-10-CM | POA: Diagnosis not present

## 2016-12-15 DIAGNOSIS — M129 Arthropathy, unspecified: Secondary | ICD-10-CM | POA: Diagnosis not present

## 2016-12-16 DIAGNOSIS — M129 Arthropathy, unspecified: Secondary | ICD-10-CM | POA: Diagnosis not present

## 2016-12-17 DIAGNOSIS — M129 Arthropathy, unspecified: Secondary | ICD-10-CM | POA: Diagnosis not present

## 2016-12-18 DIAGNOSIS — M129 Arthropathy, unspecified: Secondary | ICD-10-CM | POA: Diagnosis not present

## 2016-12-19 DIAGNOSIS — M129 Arthropathy, unspecified: Secondary | ICD-10-CM | POA: Diagnosis not present

## 2016-12-20 DIAGNOSIS — M129 Arthropathy, unspecified: Secondary | ICD-10-CM | POA: Diagnosis not present

## 2016-12-21 DIAGNOSIS — M129 Arthropathy, unspecified: Secondary | ICD-10-CM | POA: Diagnosis not present

## 2016-12-22 DIAGNOSIS — M129 Arthropathy, unspecified: Secondary | ICD-10-CM | POA: Diagnosis not present

## 2016-12-23 DIAGNOSIS — M129 Arthropathy, unspecified: Secondary | ICD-10-CM | POA: Diagnosis not present

## 2016-12-24 DIAGNOSIS — M129 Arthropathy, unspecified: Secondary | ICD-10-CM | POA: Diagnosis not present

## 2016-12-25 DIAGNOSIS — M129 Arthropathy, unspecified: Secondary | ICD-10-CM | POA: Diagnosis not present

## 2016-12-26 DIAGNOSIS — M129 Arthropathy, unspecified: Secondary | ICD-10-CM | POA: Diagnosis not present

## 2016-12-27 DIAGNOSIS — M129 Arthropathy, unspecified: Secondary | ICD-10-CM | POA: Diagnosis not present

## 2016-12-28 DIAGNOSIS — M129 Arthropathy, unspecified: Secondary | ICD-10-CM | POA: Diagnosis not present

## 2016-12-29 DIAGNOSIS — M129 Arthropathy, unspecified: Secondary | ICD-10-CM | POA: Diagnosis not present

## 2016-12-30 DIAGNOSIS — M129 Arthropathy, unspecified: Secondary | ICD-10-CM | POA: Diagnosis not present

## 2016-12-31 DIAGNOSIS — M129 Arthropathy, unspecified: Secondary | ICD-10-CM | POA: Diagnosis not present

## 2017-01-01 DIAGNOSIS — M129 Arthropathy, unspecified: Secondary | ICD-10-CM | POA: Diagnosis not present

## 2017-01-02 DIAGNOSIS — M129 Arthropathy, unspecified: Secondary | ICD-10-CM | POA: Diagnosis not present

## 2017-01-03 DIAGNOSIS — M129 Arthropathy, unspecified: Secondary | ICD-10-CM | POA: Diagnosis not present

## 2017-01-04 DIAGNOSIS — M129 Arthropathy, unspecified: Secondary | ICD-10-CM | POA: Diagnosis not present

## 2017-01-05 DIAGNOSIS — M129 Arthropathy, unspecified: Secondary | ICD-10-CM | POA: Diagnosis not present

## 2017-01-06 DIAGNOSIS — M129 Arthropathy, unspecified: Secondary | ICD-10-CM | POA: Diagnosis not present

## 2017-01-07 DIAGNOSIS — M129 Arthropathy, unspecified: Secondary | ICD-10-CM | POA: Diagnosis not present

## 2017-01-08 DIAGNOSIS — M129 Arthropathy, unspecified: Secondary | ICD-10-CM | POA: Diagnosis not present

## 2017-01-09 DIAGNOSIS — M129 Arthropathy, unspecified: Secondary | ICD-10-CM | POA: Diagnosis not present

## 2017-01-10 DIAGNOSIS — M129 Arthropathy, unspecified: Secondary | ICD-10-CM | POA: Diagnosis not present

## 2017-01-11 DIAGNOSIS — M129 Arthropathy, unspecified: Secondary | ICD-10-CM | POA: Diagnosis not present

## 2017-01-12 DIAGNOSIS — M129 Arthropathy, unspecified: Secondary | ICD-10-CM | POA: Diagnosis not present

## 2017-01-13 DIAGNOSIS — M129 Arthropathy, unspecified: Secondary | ICD-10-CM | POA: Diagnosis not present

## 2017-01-14 DIAGNOSIS — M129 Arthropathy, unspecified: Secondary | ICD-10-CM | POA: Diagnosis not present

## 2017-01-15 ENCOUNTER — Ambulatory Visit (HOSPITAL_COMMUNITY)
Admission: RE | Admit: 2017-01-15 | Discharge: 2017-01-15 | Disposition: A | Discharge status: Home / Self Care | Payer: Medicare HMO | Source: Ambulatory Visit | Attending: Oncology | Admitting: Oncology

## 2017-01-15 DIAGNOSIS — N6489 Other specified disorders of breast: Secondary | ICD-10-CM | POA: Diagnosis not present

## 2017-01-15 DIAGNOSIS — C50911 Malignant neoplasm of unspecified site of right female breast: Secondary | ICD-10-CM

## 2017-01-15 DIAGNOSIS — M129 Arthropathy, unspecified: Secondary | ICD-10-CM | POA: Diagnosis not present

## 2017-01-15 DIAGNOSIS — R928 Other abnormal and inconclusive findings on diagnostic imaging of breast: Secondary | ICD-10-CM | POA: Diagnosis not present

## 2017-01-16 DIAGNOSIS — M129 Arthropathy, unspecified: Secondary | ICD-10-CM | POA: Diagnosis not present

## 2017-01-17 DIAGNOSIS — M129 Arthropathy, unspecified: Secondary | ICD-10-CM | POA: Diagnosis not present

## 2017-01-18 ENCOUNTER — Encounter (HOSPITAL_COMMUNITY): Payer: Medicare HMO

## 2017-01-18 ENCOUNTER — Encounter (HOSPITAL_COMMUNITY): Payer: Medicare HMO | Attending: Oncology | Admitting: Oncology

## 2017-01-18 ENCOUNTER — Encounter (HOSPITAL_COMMUNITY): Payer: Self-pay

## 2017-01-18 VITALS — BP 125/37 | HR 56 | Resp 16

## 2017-01-18 DIAGNOSIS — M129 Arthropathy, unspecified: Secondary | ICD-10-CM | POA: Diagnosis not present

## 2017-01-18 DIAGNOSIS — Z79811 Long term (current) use of aromatase inhibitors: Secondary | ICD-10-CM

## 2017-01-18 DIAGNOSIS — C50911 Malignant neoplasm of unspecified site of right female breast: Secondary | ICD-10-CM | POA: Diagnosis not present

## 2017-01-18 DIAGNOSIS — Z23 Encounter for immunization: Secondary | ICD-10-CM | POA: Diagnosis not present

## 2017-01-18 MED ORDER — INFLUENZA VAC SPLIT QUAD 0.5 ML IM SUSY
0.5000 mL | PREFILLED_SYRINGE | Freq: Once | INTRAMUSCULAR | Status: AC
  Administered 2017-01-18: 0.5 mL via INTRAMUSCULAR

## 2017-01-18 NOTE — Progress Notes (Signed)
Nutrition  Nutrition follow-up was planned today following MD clinic visit.  Patient left clinic without seeing RD.    Latrelle Bazar B. Zenia Resides, [place], [place] Registered Dietitian [phone] (pager)

## 2017-01-18 NOTE — Progress Notes (Signed)
T given flu vaccine in left deltoid. Pt tolerated injection. Pt stable and discharged home with family.

## 2017-01-18 NOTE — Progress Notes (Signed)
Angela Fire, MD [address] [place] [address]  Need for prophylactic vaccination and inoculation against influenza - Plan: Influenza vac split quadrivalent PF (FLUARIX) injection 0.5 mL  Hormone receptor positive breast cancer, right ([place]) - Plan: MM DIAG BREAST TOMO BILATERAL, US Breast Limited Uni Right Inc Axilla, US Breast Complete Hardy Axilla, CBC with Differential, Comprehensive metabolic panel  CURRENT THERAPY: Arimidex daily beginning on 02/08/2016  INTERVAL HISTORY: Angela Higgins 83 y.o. female returns for followup of ER+/HER2+ right breast cancer. Started Arimidex on 02/08/2016 after refusing HER2 targeted therapy and surgery.    Hormone receptor positive breast cancer, right ([place])   01/04/2016 Mammogram    Screening mammogram, calcifications in R breast      01/12/2016 Mammogram    Diagnostic mammogram There are coarse and heterogeneous calcifications in the inferior medial right breast spanning 5.1 cm. There are some scattered similar-appearing calcifications behind the nipple at a posterior depth as well      01/18/2016 Mammogram    Mammographic images were obtained following stereotactic guided biopsy of right breast calcifications. The coil shaped marker is in good position.  IMPRESSION: Appropriate placement of biopsy marker      01/18/2016 Initial Biopsy    Stereotactic-guided biopsy of right breast calcifications. No apparent complications      01/13/3845 Pathology Results    Breast, right, needle core biopsy, medial SMALL FOCUS OF INVASIVE DUCTAL CARCINOMA, DUCTAL CARCINOMA IN SITU WITH CALCIFICATIONS AND NECROSIS, GRADE 3 Estrogen Receptor: 100%, POSITIVE, STRONG STAINING INTENSITY Progesterone Receptor: 60%, POSITIVE, STRONG STAINING INTENSITY Proliferation Marker Ki67: 5% HER2 - **POSITIVE** RATIO OF HER2/CEP17 SIGNALS 2.60 AVERAGE HER2 COPY NUMBER PER CELL 3.90      02/08/2016 -  Anti-estrogen oral therapy          06/05/2016 Breast US    Diagnostic mammogram tomo uni right & US breast ltd uni right inc axilla IMPRESSION: 1. Calcifications at site of biopsy proven malignancy in the inner right breast as well as additional smaller group of calcifications in the slightly outer right breast appear overall stable. 2. New mass in the upper inner right breast at the 2 o'clock location possibly related to post biopsy change although papilloma or additional site of malignancy cannot be excluded.      09/18/2016 Mammogram    Diagnostic mammogram tomo uni and [place]: RECOMMENDATION: 1. Bilateral diagnostic mammogram and right breast ultrasound is recommended in September of 2018.  2. Clinical follow-up recommended for the painful area of concern in the right breast. Any further workup should be based on clinical grounds.  I have discussed the findings and recommendations with the patient. Results were also provided in writing at the conclusion of the visit. If applicable, a reminder letter will be sent to the patient regarding the next appointment.       She states that overall she feels well. She continues to take anastrozole without any side effects. She denies any new bone pain. She denies any chest pain, shortness breath, abdominal pain, focal weakness. Patient a repeat bilateral diagnostic mammogram with ultrasound on 01/15/17 which demonstrated that there is no obvious mass in the right breast where her primary malignancy was.   Review of Systems  Constitutional: Negative.  Negative for chills and fever.  HENT: Negative.   Eyes: Negative.   Respiratory: Negative.  Negative for shortness of breath.   Cardiovascular: Negative for chest pain.  Gastrointestinal: Negative.  Negative for nausea and  vomiting.  Genitourinary: Negative.   Musculoskeletal: Negative.  Negative for joint pain.  Skin: Negative.  Negative for itching and rash.  Neurological: Negative.   Endo/Heme/Allergies:  Negative.   Psychiatric/Behavioral: Negative.     Past Medical History:  Diagnosis Date  . Breast cancer ([place])   . DVT (deep venous thrombosis) ([place])    bilateral  . Hormone receptor positive breast cancer, right ([place]) 02/12/2016  . Hypertension   . Paralysis [facility])     Past Surgical History:  Procedure Laterality Date  . APPENDECTOMY    . HERNIA REPAIR      Family History  Problem Relation Age of Onset  . Heart failure Father     Social History   Social History  . Marital status: Single    Spouse name: N/A  . Number of children: N/A  . Years of education: N/A   Social History Main Topics  . Smoking status: Never Smoker  . Smokeless tobacco: Never Used  . Alcohol use No  . Drug use: No  . Sexual activity: No   Other Topics Concern  . None   Social History Narrative  . None     PHYSICAL EXAMINATION  ECOG PERFORMANCE STATUS: 2 - Symptomatic, <50% confined to bed  Vitals:   01/18/17 1301 01/18/17 1314  BP: (!) 126/35 (!) 125/37  Pulse: (!) 56 (!) 56  Resp: 16 16  SpO2: 100% 100%    Physical Exam  Constitutional: She is oriented to person, place, and time and well-developed, well-nourished, and in no distress.  HENT:  Head: Normocephalic and atraumatic.  Mouth/Throat: Oropharynx is clear and moist.  Eyes: Pupils are equal, round, and reactive to light. Conjunctivae and EOM are normal.  Neck: Normal range of motion. Neck supple.  Cardiovascular: Normal rate, regular rhythm and normal heart sounds.   Pulmonary/Chest: Effort normal and breath sounds normal. Right breast exhibits no inverted nipple, no mass, no nipple discharge, no skin change and no tenderness. Left breast exhibits no inverted nipple, no mass, no nipple discharge, no skin change and no tenderness.    Abdominal: Soft. Bowel sounds are normal.  Musculoskeletal: Normal range of motion.  Neurological: She is alert and oriented to person, place, and time. Gait normal.  Skin: Skin is warm  and dry.  Nursing note and vitals reviewed.   LABORATORY DATA: I have reviewed the data as listed. CBC    Component Value Date/Time   WBC 4.2 11/15/2016 0906   RBC 3.58 (L) 11/15/2016 0906   HGB 10.2 (L) 11/15/2016 0906   HCT 31.7 (L) 11/15/2016 0906   PLT 190 11/15/2016 0906   MCV 88.5 11/15/2016 0906   MCH 28.5 11/15/2016 0906   MCHC 32.2 11/15/2016 0906   RDW 14.8 11/15/2016 0906   LYMPHSABS 2.1 09/20/2016 1328   MONOABS 0.3 09/20/2016 1328   EOSABS 0.3 09/20/2016 1328   BASOSABS 0.0 09/20/2016 1328      Chemistry      Component Value Date/Time   NA 136 11/13/2016 2019   K 4.7 11/13/2016 2019   CL 98 (L) 11/13/2016 2019   CO2 27 11/13/2016 2019   BUN 36 (H) 11/13/2016 2019   CREATININE 1.22 (H) 11/13/2016 2019      Component Value Date/Time   CALCIUM 10.3 11/13/2016 2019   ALKPHOS 75 11/13/2016 2019   AST 26 11/13/2016 2019   ALT 14 11/13/2016 2019   BILITOT 0.7 11/13/2016 2019        PENDING LABS:  RADIOGRAPHIC STUDIES: I have personally reviewed the radiological images as listed and agreed with the findings in the report.  US Breast Complete Uni Right Inc Axilla  Result Date: 01/15/2017 CLINICAL DATA:  83 year old patient presents for evaluation of both breasts. She has biopsy proven grade 3 ductal carcinoma in situ with small focus of invasive carcinoma following stereotactic biopsy of calcifications in the medial right breast. The patient opted for medical treatment only. No surgery was performed. She presents for annual evaluation of the left breast and short-term follow-up of the right breast. Previously, on ultrasound of the right breast in May 2018, fat necrosis was noted in the 2 o'clock position right breast 3 cm from the nipple and ultrasound of the right breast is suggested for today's follow-up exam. EXAM: 2D DIGITAL DIAGNOSTIC BILATERAL MAMMOGRAM WITH CAD AND ADJUNCT TOMO ULTRASOUND RIGHT BREAST COMPARISON:  Previous exam(s). ACR Breast Density  Category b: There are scattered areas of fibroglandular density. FINDINGS: Images are to the best of the patient and technologist's abilities. The patient is in a wheelchair. Coarse calcifications in the medial right breast, in the region of the previous biopsy, are mammographically stable. The biopsy clip is only visualized on the MLO projection today, due to difficulty positioning the patient in the CC projection, secondary to limited patient mobility. Additional benign appearing calcifications scattered in the right breast are stable. No new mass, new suspicious microcalcification, or architectural distortion is identified in either breast. There no findings to suggest malignancy on the left. Mammographic images were processed with CAD. Targeted ultrasound is performed, showing normal breast parenchyma in the 2 o'clock position of the right breast. No mass or fat necrosis changes are seen on ultrasound today. IMPRESSION: Biopsy proven malignancy in the medial right breast, without mammographic change. Ultrasound of the 2 o'clock region of the right breast is negative. No evidence of malignancy in the left breast. RECOMMENDATION: Continued clinical follow-up, and follow-up mammography as indicated to assess treatment response. I have discussed the findings and recommendations with the patient. Results were also provided in writing at the conclusion of the visit. If applicable, a reminder letter will be sent to the patient regarding the next appointment. BI-RADS CATEGORY  6: Known biopsy-proven malignancy. Electronically Signed   By: Curlene Dolphin M.D.   On: 01/15/2017 10:44   Mm Diag Breast Tomo Bilateral  Result Date: 01/15/2017 CLINICAL DATA:  83 year old patient presents for evaluation of both breasts. She has biopsy proven grade 3 ductal carcinoma in situ with small focus of invasive carcinoma following stereotactic biopsy of calcifications in the medial right breast. The patient opted for medical  treatment only. No surgery was performed. She presents for annual evaluation of the left breast and short-term follow-up of the right breast. Previously, on ultrasound of the right breast in May 2018, fat necrosis was noted in the 2 o'clock position right breast 3 cm from the nipple and ultrasound of the right breast is suggested for today's follow-up exam. EXAM: 2D DIGITAL DIAGNOSTIC BILATERAL MAMMOGRAM WITH CAD AND ADJUNCT TOMO ULTRASOUND RIGHT BREAST COMPARISON:  Previous exam(s). ACR Breast Density Category b: There are scattered areas of fibroglandular density. FINDINGS: Images are to the best of the patient and technologist's abilities. The patient is in a wheelchair. Coarse calcifications in the medial right breast, in the region of the previous biopsy, are mammographically stable. The biopsy clip is only visualized on the MLO projection today, due to difficulty positioning the patient in the CC projection, secondary to limited patient  mobility. Additional benign appearing calcifications scattered in the right breast are stable. No new mass, new suspicious microcalcification, or architectural distortion is identified in either breast. There no findings to suggest malignancy on the left. Mammographic images were processed with CAD. Targeted ultrasound is performed, showing normal breast parenchyma in the 2 o'clock position of the right breast. No mass or fat necrosis changes are seen on ultrasound today. IMPRESSION: Biopsy proven malignancy in the medial right breast, without mammographic change. Ultrasound of the 2 o'clock region of the right breast is negative. No evidence of malignancy in the left breast. RECOMMENDATION: Continued clinical follow-up, and follow-up mammography as indicated to assess treatment response. I have discussed the findings and recommendations with the patient. Results were also provided in writing at the conclusion of the visit. If applicable, a reminder letter will be sent to the  patient regarding the next appointment. BI-RADS CATEGORY  6: Known biopsy-proven malignancy. Electronically Signed   By: Curlene Dolphin M.D.   On: 01/15/2017 10:44   Diagnostic mammogram tomo uni right & US breast uni right inc axilla 06/05/2016 IMPRESSION: 1. Calcifications at site of biopsy proven malignancy in the inner right breast as well as additional smaller group of calcifications in the slightly outer right breast appear overall stable.  2. New mass in the upper inner right breast at the 2 o'clock location possibly related to post biopsy change although papilloma or additional site of malignancy cannot be excluded.  PATHOLOGY:    ASSESSMENT AND PLAN:  Hormone receptor positive breast cancer, right ([facility]) ER+/HER2+ right breast cancer.   Diagnostic mammogram and right breast ultrasound performed on 01/15/2017 demonstrates no mass.  Continue arimidex at this time. Will repeat diagnostic b/l breast mammogram with [place] in 6 months. RTC in 6 months for follow up with labs.    ORDERS PLACED FOR THIS ENCOUNTER: Orders Placed This Encounter  Procedures  . MM DIAG BREAST TOMO BILATERAL  . US Breast Limited Uni Right Inc Axilla  . US Breast Complete Uni Left Inc Axilla  . CBC with Differential  . Comprehensive metabolic panel    THERAPY PLAN:  Continue Arimidex daily.  All questions were answered. The patient knows to call the clinic with any problems, questions or concerns. We can certainly see the patient much sooner if necessary.   This note is electronically signed by: Twana First, MD 01/18/2017 2:13 PM

## 2017-01-19 DIAGNOSIS — M129 Arthropathy, unspecified: Secondary | ICD-10-CM | POA: Diagnosis not present

## 2017-01-20 DIAGNOSIS — M129 Arthropathy, unspecified: Secondary | ICD-10-CM | POA: Diagnosis not present

## 2017-01-21 DIAGNOSIS — M129 Arthropathy, unspecified: Secondary | ICD-10-CM | POA: Diagnosis not present

## 2017-01-22 DIAGNOSIS — M129 Arthropathy, unspecified: Secondary | ICD-10-CM | POA: Diagnosis not present

## 2017-01-23 DIAGNOSIS — M129 Arthropathy, unspecified: Secondary | ICD-10-CM | POA: Diagnosis not present

## 2017-01-24 DIAGNOSIS — M129 Arthropathy, unspecified: Secondary | ICD-10-CM | POA: Diagnosis not present

## 2017-01-25 DIAGNOSIS — M129 Arthropathy, unspecified: Secondary | ICD-10-CM | POA: Diagnosis not present

## 2017-01-26 DIAGNOSIS — M129 Arthropathy, unspecified: Secondary | ICD-10-CM | POA: Diagnosis not present

## 2017-01-27 DIAGNOSIS — M129 Arthropathy, unspecified: Secondary | ICD-10-CM | POA: Diagnosis not present

## 2017-01-28 DIAGNOSIS — M129 Arthropathy, unspecified: Secondary | ICD-10-CM | POA: Diagnosis not present

## 2017-01-29 DIAGNOSIS — M129 Arthropathy, unspecified: Secondary | ICD-10-CM | POA: Diagnosis not present

## 2017-01-30 DIAGNOSIS — M129 Arthropathy, unspecified: Secondary | ICD-10-CM | POA: Diagnosis not present

## 2017-01-31 DIAGNOSIS — M129 Arthropathy, unspecified: Secondary | ICD-10-CM | POA: Diagnosis not present

## 2017-02-01 DIAGNOSIS — M129 Arthropathy, unspecified: Secondary | ICD-10-CM | POA: Diagnosis not present

## 2017-02-02 DIAGNOSIS — M129 Arthropathy, unspecified: Secondary | ICD-10-CM | POA: Diagnosis not present

## 2017-02-03 DIAGNOSIS — M129 Arthropathy, unspecified: Secondary | ICD-10-CM | POA: Diagnosis not present

## 2017-02-04 DIAGNOSIS — M129 Arthropathy, unspecified: Secondary | ICD-10-CM | POA: Diagnosis not present

## 2017-02-05 DIAGNOSIS — M129 Arthropathy, unspecified: Secondary | ICD-10-CM | POA: Diagnosis not present

## 2017-02-06 DIAGNOSIS — M129 Arthropathy, unspecified: Secondary | ICD-10-CM | POA: Diagnosis not present

## 2017-02-07 DIAGNOSIS — M129 Arthropathy, unspecified: Secondary | ICD-10-CM | POA: Diagnosis not present

## 2017-02-08 DIAGNOSIS — M129 Arthropathy, unspecified: Secondary | ICD-10-CM | POA: Diagnosis not present

## 2017-02-09 DIAGNOSIS — M129 Arthropathy, unspecified: Secondary | ICD-10-CM | POA: Diagnosis not present

## 2017-02-10 DIAGNOSIS — M129 Arthropathy, unspecified: Secondary | ICD-10-CM | POA: Diagnosis not present

## 2017-02-11 DIAGNOSIS — M129 Arthropathy, unspecified: Secondary | ICD-10-CM | POA: Diagnosis not present

## 2017-02-12 DIAGNOSIS — M129 Arthropathy, unspecified: Secondary | ICD-10-CM | POA: Diagnosis not present

## 2017-02-13 DIAGNOSIS — M129 Arthropathy, unspecified: Secondary | ICD-10-CM | POA: Diagnosis not present

## 2017-02-14 DIAGNOSIS — M129 Arthropathy, unspecified: Secondary | ICD-10-CM | POA: Diagnosis not present

## 2017-02-15 DIAGNOSIS — M129 Arthropathy, unspecified: Secondary | ICD-10-CM | POA: Diagnosis not present

## 2017-02-16 DIAGNOSIS — M129 Arthropathy, unspecified: Secondary | ICD-10-CM | POA: Diagnosis not present

## 2017-02-17 DIAGNOSIS — M129 Arthropathy, unspecified: Secondary | ICD-10-CM | POA: Diagnosis not present

## 2017-02-18 DIAGNOSIS — M129 Arthropathy, unspecified: Secondary | ICD-10-CM | POA: Diagnosis not present

## 2017-02-19 DIAGNOSIS — M129 Arthropathy, unspecified: Secondary | ICD-10-CM | POA: Diagnosis not present

## 2017-02-20 DIAGNOSIS — M129 Arthropathy, unspecified: Secondary | ICD-10-CM | POA: Diagnosis not present

## 2017-02-21 DIAGNOSIS — M129 Arthropathy, unspecified: Secondary | ICD-10-CM | POA: Diagnosis not present

## 2017-02-22 DIAGNOSIS — M129 Arthropathy, unspecified: Secondary | ICD-10-CM | POA: Diagnosis not present

## 2017-02-23 DIAGNOSIS — M129 Arthropathy, unspecified: Secondary | ICD-10-CM | POA: Diagnosis not present

## 2017-02-24 DIAGNOSIS — M129 Arthropathy, unspecified: Secondary | ICD-10-CM | POA: Diagnosis not present

## 2017-03-05 ENCOUNTER — Other Ambulatory Visit (HOSPITAL_COMMUNITY): Payer: Self-pay | Admitting: Hematology & Oncology

## 2017-06-06 DIAGNOSIS — I10 Essential (primary) hypertension: Secondary | ICD-10-CM | POA: Diagnosis not present

## 2017-06-06 DIAGNOSIS — Z1389 Encounter for screening for other disorder: Secondary | ICD-10-CM | POA: Diagnosis not present

## 2017-06-06 DIAGNOSIS — I6350 Cerebral infarction due to unspecified occlusion or stenosis of unspecified cerebral artery: Secondary | ICD-10-CM | POA: Diagnosis not present

## 2017-06-06 DIAGNOSIS — G8190 Hemiplegia, unspecified affecting unspecified side: Secondary | ICD-10-CM | POA: Diagnosis not present

## 2017-06-06 DIAGNOSIS — Z0000 Encounter for general adult medical examination without abnormal findings: Secondary | ICD-10-CM | POA: Diagnosis not present

## 2017-06-06 DIAGNOSIS — Z1331 Encounter for screening for depression: Secondary | ICD-10-CM | POA: Diagnosis not present

## 2017-06-06 DIAGNOSIS — Z0001 Encounter for general adult medical examination with abnormal findings: Secondary | ICD-10-CM | POA: Diagnosis not present

## 2017-06-06 DIAGNOSIS — M159 Polyosteoarthritis, unspecified: Secondary | ICD-10-CM | POA: Diagnosis not present

## 2017-07-09 ENCOUNTER — Encounter (HOSPITAL_COMMUNITY): Payer: Self-pay

## 2017-07-09 ENCOUNTER — Other Ambulatory Visit (HOSPITAL_COMMUNITY): Payer: Self-pay

## 2017-07-11 ENCOUNTER — Other Ambulatory Visit (HOSPITAL_COMMUNITY): Payer: Self-pay

## 2017-07-18 ENCOUNTER — Encounter (HOSPITAL_COMMUNITY): Payer: Self-pay | Admitting: Adult Health

## 2017-07-18 ENCOUNTER — Ambulatory Visit (HOSPITAL_COMMUNITY): Payer: Self-pay

## 2017-07-18 ENCOUNTER — Inpatient Hospital Stay (HOSPITAL_COMMUNITY): Payer: Medicare HMO | Attending: Adult Health | Admitting: Adult Health

## 2017-07-18 ENCOUNTER — Inpatient Hospital Stay (HOSPITAL_COMMUNITY): Payer: Medicare HMO

## 2017-07-18 ENCOUNTER — Other Ambulatory Visit: Payer: Self-pay

## 2017-07-18 VITALS — BP 174/52 | HR 92 | Temp 98.6°F | Resp 20 | Wt 107.9 lb

## 2017-07-18 DIAGNOSIS — D631 Anemia in chronic kidney disease: Secondary | ICD-10-CM

## 2017-07-18 DIAGNOSIS — N189 Chronic kidney disease, unspecified: Secondary | ICD-10-CM

## 2017-07-18 DIAGNOSIS — C50911 Malignant neoplasm of unspecified site of right female breast: Secondary | ICD-10-CM | POA: Insufficient documentation

## 2017-07-18 DIAGNOSIS — Z170 Estrogen receptor positive status [ER+]: Secondary | ICD-10-CM | POA: Diagnosis not present

## 2017-07-18 DIAGNOSIS — K5900 Constipation, unspecified: Secondary | ICD-10-CM

## 2017-07-18 DIAGNOSIS — Z7982 Long term (current) use of aspirin: Secondary | ICD-10-CM | POA: Diagnosis not present

## 2017-07-18 DIAGNOSIS — Z79899 Other long term (current) drug therapy: Secondary | ICD-10-CM | POA: Diagnosis not present

## 2017-07-18 DIAGNOSIS — Z79811 Long term (current) use of aromatase inhibitors: Secondary | ICD-10-CM | POA: Insufficient documentation

## 2017-07-18 DIAGNOSIS — R531 Weakness: Secondary | ICD-10-CM | POA: Diagnosis not present

## 2017-07-18 DIAGNOSIS — Z780 Asymptomatic menopausal state: Secondary | ICD-10-CM

## 2017-07-18 LAB — COMPREHENSIVE METABOLIC PANEL
ALT: 21 U/L (ref 14–54)
ANION GAP: 10 (ref 5–15)
AST: 30 U/L (ref 15–41)
Albumin: 3.4 g/dL — ABNORMAL LOW (ref 3.5–5.0)
Alkaline Phosphatase: 83 U/L (ref 38–126)
BILIRUBIN TOTAL: 0.6 mg/dL (ref 0.3–1.2)
BUN: 39 mg/dL — ABNORMAL HIGH (ref 6–20)
CALCIUM: 9.1 mg/dL (ref 8.9–10.3)
CO2: 25 mmol/L (ref 22–32)
Chloride: 106 mmol/L (ref 101–111)
Creatinine, Ser: 1.36 mg/dL — ABNORMAL HIGH (ref 0.44–1.00)
GFR calc non Af Amer: 32 mL/min — ABNORMAL LOW (ref >60)
GFR, EST AFRICAN AMERICAN: 38 mL/min — AB (ref >60)
Glucose, Bld: 96 mg/dL (ref 65–99)
Potassium: 4.1 mmol/L (ref 3.5–5.1)
SODIUM: 141 mmol/L (ref 135–145)
TOTAL PROTEIN: 6.7 g/dL (ref 6.5–8.1)

## 2017-07-18 LAB — CBC WITH DIFFERENTIAL/PLATELET
Basophils Absolute: 0 10*3/uL (ref 0.0–0.1)
Basophils Relative: 1 %
EOS ABS: 0.4 10*3/uL (ref 0.0–0.7)
Eosinophils Relative: 9 %
HCT: 30.1 % — ABNORMAL LOW (ref 36.0–46.0)
Hemoglobin: 9.3 g/dL — ABNORMAL LOW (ref 12.0–15.0)
LYMPHS ABS: 1.8 10*3/uL (ref 0.7–4.0)
Lymphocytes Relative: 45 %
MCH: 27.1 pg (ref 26.0–34.0)
MCHC: 30.9 g/dL (ref 30.0–36.0)
MCV: 87.8 fL (ref 78.0–100.0)
MONO ABS: 0.2 10*3/uL (ref 0.1–1.0)
MONOS PCT: 6 %
Neutro Abs: 1.5 10*3/uL — ABNORMAL LOW (ref 1.7–7.7)
Neutrophils Relative %: 39 %
Platelets: 171 10*3/uL (ref 150–400)
RBC: 3.43 MIL/uL — ABNORMAL LOW (ref 3.87–5.11)
RDW: 16.4 % — AB (ref 11.5–15.5)
WBC: 3.8 10*3/uL — ABNORMAL LOW (ref 4.0–10.5)

## 2017-07-18 MED ORDER — ANASTROZOLE 1 MG PO TABS: 1.0000 mg | ORAL_TABLET | Freq: Every day | ORAL | 1 refills | Status: DC

## 2017-07-18 NOTE — Patient Instructions (Addendum)
[place] at [facility] Discharge Instructions Today you saw Angela Higgins We will schedule you for your bone density scan and mammogram in May or June Follow up here in 6 months with labs   Thank you for choosing [place] at [facility] to provide your oncology and hematology care.  To afford each patient quality time with our provider, please arrive at least 15 minutes before your scheduled appointment time.   If you have a lab appointment with the [facility] please come in thru the  Main Entrance and check in at the main information desk  You need to re-schedule your appointment should you arrive 10 or more minutes late.  We strive to give you quality time with our providers, and arriving late affects you and other patients whose appointments are after yours.  Also, if you no show three or more times for appointments you may be dismissed from the clinic at the providers discretion.     Again, thank you for choosing [facility].  Our hope is that these requests will decrease the amount of time that you wait before being seen by our physicians.       _____________________________________________________________  Should you have questions after your visit to [facility], please contact our office at [phone] between the hours of 8:30 a.m. and 4:30 p.m.  Voicemails left after 4:30 p.m. will not be returned until the following business day.  For prescription refill requests, have your pharmacy contact our office.       Resources For Cancer Patients and their Caregivers ? American Cancer Society: Can assist with transportation, wigs, general needs, runs Look Good Feel Better.        (782)261-8203 ? Cancer Care: Provides financial assistance, online support groups, medication/co-pay assistance.  1-800-813-HOPE (325)274-4107) ? [employer] Assists [place] cancer patients and their  families through emotional , educational and financial support.  217-212-7547 ? [place] DSS Where to apply for food stamps, Medicaid and utility assistance. 586-796-0971 ? [facility]: Transportation to medical appointments. (970) 732-7234 ? Social Security Administration: May apply for disability if have a Stage IV cancer. 903-732-1384 863-742-6641 ? [employer], Disability and Transit Services: Assists with nutrition, care and transit needs. [place] Support Programs:   > Cancer Support Group  2nd Tuesday of the month 1pm-2pm, Journey Room   > Creative Journey  3rd Tuesday of the month 1130am-1pm, Journey Room

## 2017-07-20 ENCOUNTER — Encounter (HOSPITAL_COMMUNITY): Payer: Self-pay | Admitting: Adult Health

## 2017-07-20 NOTE — Progress Notes (Signed)
[place] [place], [place] [address]   CLINIC:  Medical Oncology/Hematology  PCP:  Rosita Fire, MD [place] [address] [phone]   REASON FOR VISIT:  Follow-up for at least Stage II invasive ductal carcinoma of (R) breast; ER+/PR+/HER2+   CURRENT THERAPY: Arimidex daily, beginning in 01/2016 (after declining surgery and anti-HER2 therapy)   BRIEF ONCOLOGIC HISTORY:    Hormone receptor positive breast cancer, right ([place])   01/04/2016 Mammogram    Screening mammogram, calcifications in R breast      01/12/2016 Mammogram    Diagnostic mammogram There are coarse and heterogeneous calcifications in the inferior medial right breast spanning 5.1 cm. There are some scattered similar-appearing calcifications behind the nipple at a posterior depth as well      01/18/2016 Mammogram    Mammographic images were obtained following stereotactic guided biopsy of right breast calcifications. The coil shaped marker is in good position.  IMPRESSION: Appropriate placement of biopsy marker      01/18/2016 Initial Biopsy    Stereotactic-guided biopsy of right breast calcifications. No apparent complications      1/32/4401 Pathology Results    Breast, right, needle core biopsy, medial SMALL FOCUS OF INVASIVE DUCTAL CARCINOMA, DUCTAL CARCINOMA IN SITU WITH CALCIFICATIONS AND NECROSIS, GRADE 3 Estrogen Receptor: 100%, POSITIVE, STRONG STAINING INTENSITY Progesterone Receptor: 60%, POSITIVE, STRONG STAINING INTENSITY Proliferation Marker Ki67: 5% HER2 - **POSITIVE** RATIO OF HER2/CEP17 SIGNALS 2.60 AVERAGE HER2 COPY NUMBER PER CELL 3.90      02/08/2016 -  Anti-estrogen oral therapy         06/05/2016 Breast US    Diagnostic mammogram tomo uni right & US breast ltd uni right inc axilla IMPRESSION: 1. Calcifications at site of biopsy proven malignancy in the inner right breast as well as additional smaller group of  calcifications in the slightly outer right breast appear overall stable. 2. New mass in the upper inner right breast at the 2 o'clock location possibly related to post biopsy change although papilloma or additional site of malignancy cannot be excluded.      09/18/2016 Mammogram    Diagnostic mammogram tomo uni and [place]: RECOMMENDATION: 1. Bilateral diagnostic mammogram and right breast ultrasound is recommended in September of 2018.  2. Clinical follow-up recommended for the painful area of concern in the right breast. Any further workup should be based on clinical grounds.  I have discussed the findings and recommendations with the patient. Results were also provided in writing at the conclusion of the visit. If applicable, a reminder letter will be sent to the patient regarding the next appointment.         INTERVAL HISTORY:  [name] 84 y.o. female returns for follow-up for right breast cancer.   Here today with her sister.  Pt seen seated in wheelchair. Per nursing, it took 2 people to help the patient stand to get her weight today d/t her frailty and weakness.    Overall, she tells me she has been feeling well. Appetite and energy levels both reportedly 100%.  Denies any changes to her breasts that she is aware of. "I check them sometimes, but not all the time."  Remains on Arimidex. Denies any arthralgias, vaginal dryness, or hot flashes.    Last mammogram was in 12/2016. We do not have records of DEXA scan.    During our discussion, it becomes more apparent that patient may have some dementia/memory problems. She has trouble recalling specific details  about her health, her cancer, and her medications (including the Arimidex).  Her sister tries to remind her about her medical history, but patient seems to have a hard time remembering.     REVIEW OF SYSTEMS:  Review of Systems  HENT:  Negative.   Eyes: Negative.   Respiratory: Negative.   Cardiovascular:  Negative.   Gastrointestinal: Positive for constipation (chronic).  Endocrine: Negative.  Negative for hot flashes.  Genitourinary: Negative.  Negative for hematuria and vaginal bleeding.   Musculoskeletal: Negative.  Negative for arthralgias.  Neurological: Negative.  Negative for dizziness and headaches.  Hematological: Negative.      PAST MEDICAL/SURGICAL HISTORY:  Past Medical History:  Diagnosis Date  . Breast cancer ([place])   . DVT (deep venous thrombosis) ([place])    bilateral  . Hormone receptor positive breast cancer, right ([place]) 02/12/2016  . Hypertension   . Paralysis [facility])    Past Surgical History:  Procedure Laterality Date  . APPENDECTOMY    . HERNIA REPAIR       SOCIAL HISTORY:  Social History   Socioeconomic History  . Marital status: Single    Spouse name: Not on file  . Number of children: Not on file  . Years of education: Not on file  . Highest education level: Not on file  Occupational History  . Not on file  Social Needs  . Financial resource strain: Not on file  . Food insecurity:    Worry: Not on file    Inability: Not on file  . Transportation needs:    Medical: Not on file    Non-medical: Not on file  Tobacco Use  . Smoking status: Never Smoker  . Smokeless tobacco: Never Used  Substance and Sexual Activity  . Alcohol use: No  . Drug use: No  . Sexual activity: Never  Lifestyle  . Physical activity:    Days per week: Not on file    Minutes per session: Not on file  . Stress: Not on file  Relationships  . Social connections:    Talks on phone: Not on file    Gets together: Not on file    Attends religious service: Not on file    Active member of club or organization: Not on file    Attends meetings of clubs or organizations: Not on file    Relationship status: Not on file  . Intimate partner violence:    Fear of current or ex partner: Not on file    Emotionally abused: Not on file    Physically abused: Not on file    Forced  sexual activity: Not on file  Other Topics Concern  . Not on file  Social History Narrative  . Not on file    FAMILY HISTORY:  Family History  Problem Relation Age of Onset  . Heart failure Father     CURRENT MEDICATIONS:  Outpatient Encounter Medications as of 07/18/2017  Medication Sig  . amLODipine (NORVASC) 5 MG tablet Take 5 mg by mouth daily.   Marland Kitchen anastrozole (ARIMIDEX) 1 MG tablet Take 1 tablet (1 mg total) by mouth daily.  Marland Kitchen aspirin EC 81 MG tablet Take 81 mg by mouth at bedtime.  . baclofen (LIORESAL) 10 MG tablet Take 10 mg by mouth 2 (two) times daily as needed for muscle spasms (cramps).  . latanoprost (XALATAN) 0.005 % ophthalmic solution Place 1 drop into both eyes at bedtime.  Marland Kitchen lisinopril-hydrochlorothiazide (PRINZIDE,ZESTORETIC) 20-25 MG tablet Take 1 tablet by mouth daily.  Marland Kitchen  Rivaroxaban 15 & 20 MG TBPK Take as directed on package: Start with one 82m tablet by mouth twice a day with food. On Day 22, switch to one 254mtablet once a day with food.  . [DISCONTINUED] anastrozole (ARIMIDEX) 1 MG tablet TAKE ONE TABLET BY MOUTH ONCE DAILY.  . [DISCONTINUED] anastrozole (ARIMIDEX) 1 MG tablet TAKE ONE TABLET BY MOUTH ONCE DAILY.   No facility-administered encounter medications on file as of 07/18/2017.     ALLERGIES:  No Known Allergies   PHYSICAL EXAM:  ECOG Performance status: 2-3 - Symptomatic; requires significant assistance  Vitals:   07/18/17 1448  BP: (!) 174/52  Pulse: 92  Resp: 20  Temp: 98.6 F (37 C)  SpO2: 98%   Filed Weights   07/18/17 1448  Weight: 107 lb 14.4 oz (48.9 kg)    Physical Exam  Constitutional:  Elderly female in no acute distress.  -Exam done with patient seated in wheelchair  -Patient required quite a bit of assistance to undress for clinical breast exam   HENT:  Head: Normocephalic.  Mouth/Throat: Oropharynx is clear and moist. No oropharyngeal exudate.  Eyes: Pupils are equal, round, and reactive to light. Conjunctivae  are normal. No scleral icterus.  Neck: Normal range of motion. Neck supple.  Cardiovascular: Normal rate and regular rhythm.  Pulmonary/Chest: Effort normal. No respiratory distress.    Diminished breath sounds bilat bases  Abdominal: Soft. Bowel sounds are normal. There is no tenderness.  Musculoskeletal: She exhibits no edema.  Lymphadenopathy:    She has no cervical adenopathy.       Right: No supraclavicular adenopathy present.       Left: No supraclavicular adenopathy present.  Neurological: She is alert.  Skin: Skin is warm and dry. No rash noted.  Psychiatric:  Appears to have element of dementia at times during visit. Difficulty with short-term and long-term memory   Nursing note and vitals reviewed.  **Breast exam slightly limited, as it had to be performed with patient seated in wheelchair given her weakness/concern for falls risk.     LABORATORY DATA:  I have reviewed the labs as listed.  CBC    Component Value Date/Time   WBC 3.8 (L) 07/18/2017 1358   RBC 3.43 (L) 07/18/2017 1358   HGB 9.3 (L) 07/18/2017 1358   HCT 30.1 (L) 07/18/2017 1358   PLT 171 07/18/2017 1358   MCV 87.8 07/18/2017 1358   MCH 27.1 07/18/2017 1358   MCHC 30.9 07/18/2017 1358   RDW 16.4 (H) 07/18/2017 1358   LYMPHSABS 1.8 07/18/2017 1358   MONOABS 0.2 07/18/2017 1358   EOSABS 0.4 07/18/2017 1358   BASOSABS 0.0 07/18/2017 1358   CMP Latest Ref Rng & Units 07/18/2017 11/13/2016 09/20/2016  Glucose 65 - 99 mg/dL 96 112(H) 81  BUN 6 - 20 mg/dL 39(H) 36(H) 36(H)  Creatinine 0.44 - 1.00 mg/dL 1.36(H) 1.22(H) 1.49(H)  Sodium 135 - 145 mmol/L 141 136 140  Potassium 3.5 - 5.1 mmol/L 4.1 4.7 4.2  Chloride 101 - 111 mmol/L 106 98(L) 104  CO2 22 - 32 mmol/L _0 Calcium 8.9 - 10.3 mg/dL 9.1 10.3 9.6  Total Protein 6.5 - 8.1 g/dL 6.7 8.2(H) 7.3  Total Bilirubin 0.3 - 1.2 mg/dL 0.6 0.7 0.5  Alkaline Phos 38 - 126 U/L 83 75 72  AST 15 - 41 U/L _1 ALT 14 - 54 U/L _2 PENDING  LABS:    DIAGNOSTIC IMAGING:  *  The following radiologic images and reports have been reviewed independently and agree with below findings.  Last mammogram: 01/15/17 CLINICAL DATA:  84 year old patient presents for evaluation of both breasts. She has biopsy proven grade 3 ductal carcinoma in situ with small focus of invasive carcinoma following stereotactic biopsy of calcifications in the medial right breast. The patient opted for medical treatment only. No surgery was performed. She presents for annual evaluation of the left breast and short-term follow-up of the right breast. Previously, on ultrasound of the right breast in May 2018, fat necrosis was noted in the 2 o'clock position right breast 3 cm from the nipple and ultrasound of the right breast is suggested for today's follow-up exam.  EXAM: 2D DIGITAL DIAGNOSTIC BILATERAL MAMMOGRAM WITH CAD AND ADJUNCT TOMO  ULTRASOUND RIGHT BREAST  COMPARISON:  Previous exam(s).  ACR Breast Density Category b: There are scattered areas of fibroglandular density.  FINDINGS: Images are to the best of the patient and technologist's abilities. The patient is in a wheelchair. Coarse calcifications in the medial right breast, in the region of the previous biopsy, are mammographically stable. The biopsy clip is only visualized on the MLO projection today, due to difficulty positioning the patient in the CC projection, secondary to limited patient mobility. Additional benign appearing calcifications scattered in the right breast are stable. No new mass, new suspicious microcalcification, or architectural distortion is identified in either breast.  There no findings to suggest malignancy on the left.  Mammographic images were processed with CAD.  Targeted ultrasound is performed, showing normal breast parenchyma in the 2 o'clock position of the right breast. No mass or fat necrosis changes are seen on ultrasound  today.  IMPRESSION: Biopsy proven malignancy in the medial right breast, without mammographic change.  Ultrasound of the 2 o'clock region of the right breast is negative.  No evidence of malignancy in the left breast.  RECOMMENDATION: Continued clinical follow-up, and follow-up mammography as indicated to assess treatment response.  I have discussed the findings and recommendations with the patient. Results were also provided in writing at the conclusion of the visit. If applicable, a reminder letter will be sent to the patient regarding the next appointment.  BI-RADS CATEGORY  6: Known biopsy-proven malignancy.   Electronically Signed   By: Curlene Dolphin M.D.   On: 01/15/2017 10:44    PATHOLOGY:  (R) breast biopsy path: 01/18/16       ASSESSMENT & PLAN:   At least Stage II invasive ductal carcinoma of (R) breast; ER+/PR+/HER2+: -Diagnosed in 12/2015. Patient refused surgery and anti-HER2 therapy.  Elected to take anti-estrogen therapy alone. Started Arimidex in 01/2016.  -Remains on Arimidex with good tolerance.  Will plan to continue as long as she is able to tolerate, since this is the sole treatment in place for her triple positive breast cancer at this time.  She continues to decline surgery consideration. Given her advanced age, comorbid conditions, and possible dementia, continuing anti-estrogen therapy alone at this time is not unreasonable.   -Last mammogram on 01/15/17 showed no mammographic change to (R) breast cancer.  Clinical breast exam performed today.  Given that there is palpable fullness in the area of known malignancy, which has been stable mammographically, will obtain repeat mammogram sometime within the next 2 months or so.  -Return to cancer center in 6 months for follow-up with labs.     Bone health:  -No DEXA scan records available for review.  It has been recommended in the past that  she get DEXA scan. I spent some time today discussing  the importance of monitoring her bone density while on aromatase inhibitor therapy.  She agreed to undergo DEXA imaging; orders placed today. Will try to coordinate same day as mammogram sometime in the next 2 months or so.   -Can consider starting bisphosphonate therapy if DEXA scan shows osteopenia or osteoporosis.     Anemia:  -Likely d/t CKD.  -Labs were reviewed in detail with pt/sister today.  Hgb 9.3 g/dL and appears normocytic, likely indicating anemia of chronic disease. No frank bleeding episodes reported.  BUN/CRE elevated at 39/1.36 today. Encouraged her to increase fluid consumption as tolerated.   -Reinforced the importance of adequate BP control to help with CKD management. Her BP was elevated today with SBP 170s.  -Can consider starting ESA therapy in the future, if deemed clinically appropriate. Will add-on erythropoietin to labs at next visit.    Health maintenance:  -Encouraged follow-up with PCP as directed.  -Albumin slightly low on labs. Weight is relatively stable +/- 3-4 lbs.  Encouraged her to increase her protein intake, as tolerated. She was given some Boost/Ensure samples today to try. She likes the vanilla flavors.      Dispo:  -Continue Anastrozole.  -Mammogram and DEXA scan in ~2 months.  -Return to cancer center in 6 months for follow-up with labs.     All questions were answered to patient's stated satisfaction. Encouraged patient to call with any new concerns or questions before her next visit to the cancer center and we can certain see her sooner, if needed.      Orders placed this encounter:  Orders Placed This Encounter  Procedures  . DG Bone Density  . MM DIAG BREAST TOMO UNI RIGHT  . US BREAST LTD UNI RIGHT INC AXILLA      Ravon Mcilhenny, NP [place] [phone]

## 2017-09-17 ENCOUNTER — Encounter (HOSPITAL_COMMUNITY): Payer: Self-pay

## 2017-09-17 ENCOUNTER — Ambulatory Visit (HOSPITAL_COMMUNITY)
Admission: RE | Admit: 2017-09-17 | Discharge: 2017-09-17 | Disposition: A | Discharge status: Home / Self Care | Payer: Medicare HMO | Source: Ambulatory Visit | Attending: Adult Health | Admitting: Adult Health

## 2017-09-17 DIAGNOSIS — C50911 Malignant neoplasm of unspecified site of right female breast: Secondary | ICD-10-CM | POA: Diagnosis not present

## 2017-09-17 DIAGNOSIS — M810 Age-related osteoporosis without current pathological fracture: Secondary | ICD-10-CM | POA: Diagnosis not present

## 2017-09-17 DIAGNOSIS — R928 Other abnormal and inconclusive findings on diagnostic imaging of breast: Secondary | ICD-10-CM | POA: Diagnosis not present

## 2017-09-17 DIAGNOSIS — Z780 Asymptomatic menopausal state: Secondary | ICD-10-CM | POA: Insufficient documentation

## 2017-09-17 DIAGNOSIS — Z79811 Long term (current) use of aromatase inhibitors: Secondary | ICD-10-CM

## 2017-09-24 ENCOUNTER — Other Ambulatory Visit (HOSPITAL_COMMUNITY): Payer: Self-pay

## 2017-09-24 DIAGNOSIS — M5137 Other intervertebral disc degeneration, lumbosacral region: Secondary | ICD-10-CM

## 2017-09-24 DIAGNOSIS — C50911 Malignant neoplasm of unspecified site of right female breast: Secondary | ICD-10-CM

## 2017-09-24 NOTE — Progress Notes (Signed)
Per Mike Craze, NP, patient will need mammogram in September prior to her follow up on 01/16/18. Elzie Rings wanted me to review with Dr. Delton Coombes because she will no longer be working here in September. Reviewed with Dr. Raliegh Ip. He said to order the mammogram as recommended by [facility]. New order entered and message sent to scheduling.

## 2017-10-08 DIAGNOSIS — M810 Age-related osteoporosis without current pathological fracture: Secondary | ICD-10-CM | POA: Diagnosis not present

## 2017-10-08 DIAGNOSIS — M159 Polyosteoarthritis, unspecified: Secondary | ICD-10-CM | POA: Diagnosis not present

## 2017-10-08 DIAGNOSIS — I10 Essential (primary) hypertension: Secondary | ICD-10-CM | POA: Diagnosis not present

## 2017-10-08 DIAGNOSIS — G8190 Hemiplegia, unspecified affecting unspecified side: Secondary | ICD-10-CM | POA: Diagnosis not present

## 2017-10-14 ENCOUNTER — Inpatient Hospital Stay (HOSPITAL_COMMUNITY): Payer: Medicare HMO | Admitting: Hematology

## 2017-10-21 DIAGNOSIS — M79674 Pain in right toe(s): Secondary | ICD-10-CM | POA: Diagnosis not present

## 2017-10-21 DIAGNOSIS — M79672 Pain in left foot: Secondary | ICD-10-CM | POA: Diagnosis not present

## 2017-10-21 DIAGNOSIS — I739 Peripheral vascular disease, unspecified: Secondary | ICD-10-CM | POA: Diagnosis not present

## 2017-10-21 DIAGNOSIS — M79671 Pain in right foot: Secondary | ICD-10-CM | POA: Diagnosis not present

## 2017-11-19 ENCOUNTER — Inpatient Hospital Stay (HOSPITAL_COMMUNITY): Payer: Medicare HMO

## 2017-11-19 ENCOUNTER — Encounter (HOSPITAL_COMMUNITY): Payer: Self-pay | Admitting: Hematology

## 2017-11-19 ENCOUNTER — Other Ambulatory Visit: Payer: Self-pay

## 2017-11-19 ENCOUNTER — Inpatient Hospital Stay (HOSPITAL_COMMUNITY): Payer: Medicare HMO | Attending: Hematology | Admitting: Hematology

## 2017-11-19 VITALS — BP 156/48 | HR 52 | Resp 18 | Wt 109.0 lb

## 2017-11-19 DIAGNOSIS — M816 Localized osteoporosis [Lequesne]: Secondary | ICD-10-CM

## 2017-11-19 DIAGNOSIS — Z7982 Long term (current) use of aspirin: Secondary | ICD-10-CM

## 2017-11-19 DIAGNOSIS — D649 Anemia, unspecified: Secondary | ICD-10-CM

## 2017-11-19 DIAGNOSIS — D509 Iron deficiency anemia, unspecified: Secondary | ICD-10-CM | POA: Diagnosis not present

## 2017-11-19 DIAGNOSIS — I129 Hypertensive chronic kidney disease with stage 1 through stage 4 chronic kidney disease, or unspecified chronic kidney disease: Secondary | ICD-10-CM | POA: Diagnosis not present

## 2017-11-19 DIAGNOSIS — N189 Chronic kidney disease, unspecified: Secondary | ICD-10-CM

## 2017-11-19 DIAGNOSIS — C50911 Malignant neoplasm of unspecified site of right female breast: Secondary | ICD-10-CM

## 2017-11-19 DIAGNOSIS — M810 Age-related osteoporosis without current pathological fracture: Secondary | ICD-10-CM | POA: Diagnosis not present

## 2017-11-19 DIAGNOSIS — R531 Weakness: Secondary | ICD-10-CM

## 2017-11-19 DIAGNOSIS — Z170 Estrogen receptor positive status [ER+]: Secondary | ICD-10-CM | POA: Diagnosis not present

## 2017-11-19 DIAGNOSIS — Z79899 Other long term (current) drug therapy: Secondary | ICD-10-CM

## 2017-11-19 DIAGNOSIS — I229 Subsequent ST elevation (STEMI) myocardial infarction of unspecified site: Secondary | ICD-10-CM | POA: Diagnosis not present

## 2017-11-19 DIAGNOSIS — Z79811 Long term (current) use of aromatase inhibitors: Secondary | ICD-10-CM

## 2017-11-19 DIAGNOSIS — C50211 Malignant neoplasm of upper-inner quadrant of right female breast: Secondary | ICD-10-CM | POA: Diagnosis not present

## 2017-11-19 DIAGNOSIS — R5383 Other fatigue: Secondary | ICD-10-CM | POA: Diagnosis not present

## 2017-11-19 DIAGNOSIS — Z86718 Personal history of other venous thrombosis and embolism: Secondary | ICD-10-CM

## 2017-11-19 LAB — CBC WITH DIFFERENTIAL/PLATELET
Basophils Absolute: 0 10*3/uL (ref 0.0–0.1)
Basophils Relative: 1 %
EOS ABS: 0.4 10*3/uL (ref 0.0–0.7)
EOS PCT: 9 %
HCT: 32.7 % — ABNORMAL LOW (ref 36.0–46.0)
HEMOGLOBIN: 10.3 g/dL — AB (ref 12.0–15.0)
LYMPHS PCT: 42 %
Lymphs Abs: 2.1 10*3/uL (ref 0.7–4.0)
MCH: 27.7 pg (ref 26.0–34.0)
MCHC: 31.5 g/dL (ref 30.0–36.0)
MCV: 87.9 fL (ref 78.0–100.0)
Monocytes Absolute: 0.3 10*3/uL (ref 0.1–1.0)
Monocytes Relative: 6 %
Neutro Abs: 2.1 10*3/uL (ref 1.7–7.7)
Neutrophils Relative %: 42 %
PLATELETS: 189 10*3/uL (ref 150–400)
RBC: 3.72 MIL/uL — AB (ref 3.87–5.11)
RDW: 16.4 % — ABNORMAL HIGH (ref 11.5–15.5)
WBC: 5 10*3/uL (ref 4.0–10.5)

## 2017-11-19 LAB — COMPREHENSIVE METABOLIC PANEL
ALBUMIN: 3.6 g/dL (ref 3.5–5.0)
ALT: 11 U/L (ref 0–44)
AST: 18 U/L (ref 15–41)
Alkaline Phosphatase: 71 U/L (ref 38–126)
Anion gap: 7 (ref 5–15)
BUN: 50 mg/dL — AB (ref 8–23)
CALCIUM: 9.4 mg/dL (ref 8.9–10.3)
CHLORIDE: 106 mmol/L (ref 98–111)
CO2: 27 mmol/L (ref 22–32)
Creatinine, Ser: 1.61 mg/dL — ABNORMAL HIGH (ref 0.44–1.00)
GFR calc Af Amer: 31 mL/min — ABNORMAL LOW (ref >60)
GFR calc non Af Amer: 26 mL/min — ABNORMAL LOW (ref >60)
GLUCOSE: 93 mg/dL (ref 70–99)
Potassium: 4.9 mmol/L (ref 3.5–5.1)
SODIUM: 140 mmol/L (ref 135–145)
Total Bilirubin: 0.5 mg/dL (ref 0.3–1.2)
Total Protein: 7.3 g/dL (ref 6.5–8.1)

## 2017-11-19 LAB — IRON AND TIBC
Iron: 58 ug/dL (ref 28–170)
Saturation Ratios: 20 % (ref 10.4–31.8)
TIBC: 297 ug/dL (ref 250–450)
UIBC: 239 ug/dL

## 2017-11-19 LAB — FERRITIN: Ferritin: 43 ng/mL (ref 11–307)

## 2017-11-19 NOTE — Patient Instructions (Addendum)
[place] at [facility] Discharge Instructions   Please start taking calcium and vitamin D twice a day. You can buy this over the counter at the drug store as a combination pill. Also take iron pill 325mg  once daily. Take a stool softener daily called colace. All of this you can get Over the counter. We will start Prolia every 6 months. It is an injection to strengthen your bones. She also has her mammogram in September.   Today you saw Dr. Raliegh Ip.    Thank you for choosing [place] at [facility] to provide your oncology and hematology care.  To afford each patient quality time with our provider, please arrive at least 15 minutes before your scheduled appointment time.   If you have a lab appointment with the Center please come in thru the  Main Entrance and check in at the main information desk  You need to re-schedule your appointment should you arrive 10 or more minutes late.  We strive to give you quality time with our providers, and arriving late affects you and other patients whose appointments are after yours.  Also, if you no show three or more times for appointments you may be dismissed from the clinic at the providers discretion.     Again, thank you for choosing [facility].  Our hope is that these requests will decrease the amount of time that you wait before being seen by our physicians.       _____________________________________________________________  Should you have questions after your visit to [facility], please contact our office at [phone] between the hours of 8:30 a.m. and 4:30 p.m.  Voicemails left after 4:30 p.m. will not be returned until the following business day.  For prescription refill requests, have your pharmacy contact our office.       Resources For Cancer Patients and their Caregivers ? American Cancer Society: Can assist with transportation, wigs, general needs, runs  Look Good Feel Better.        985-592-9051 ? Cancer Care: Provides financial assistance, online support groups, medication/co-pay assistance.  1-800-813-HOPE 631-457-5713) ? Force Assists [place] cancer patients and their families through emotional , educational and financial support.  563-595-2641 ? [place] DSS Where to apply for food stamps, Medicaid and utility assistance. 808-331-8089 ? [facility]: Transportation to medical appointments. (458)530-2448 ? Social Security Administration: May apply for disability if have a Stage IV cancer. 310 278 4789 249-661-6327 ? [employer], Disability and Transit Services: Assists with nutrition, care and transit needs. [place] Support Programs:   > Cancer Support Group  2nd Tuesday of the month 1pm-2pm, Journey Room   > Creative Journey  3rd Tuesday of the month 1130am-1pm, Journey Room

## 2017-11-19 NOTE — Progress Notes (Signed)
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 There are some scattered similar-appearing calcifications behind the nipple at a posterior depth as well      01/18/2016 Mammogram    Mammographic images were obtained following stereotactic guided biopsy of right breast calcifications. The coil shaped marker is in good position.  IMPRESSION: Appropriate placement of biopsy marker      01/18/2016 Initial Biopsy    Stereotactic-guided biopsy of right breast calcifications. No apparent complications      1/74/9449 Pathology Results    Breast, right, needle core biopsy, medial SMALL FOCUS OF INVASIVE DUCTAL CARCINOMA, DUCTAL CARCINOMA IN SITU WITH CALCIFICATIONS AND NECROSIS, GRADE 3 Estrogen Receptor: 100%, POSITIVE, STRONG STAINING INTENSITY Progesterone Receptor: 60%, POSITIVE, STRONG STAINING INTENSITY Proliferation Marker Ki67: 5% HER2 - **POSITIVE** RATIO OF HER2/CEP17 SIGNALS 2.60 AVERAGE HER2 COPY NUMBER PER CELL 3.90      02/08/2016 -  Anti-estrogen oral therapy         06/05/2016 Breast US    Diagnostic mammogram tomo uni right & US breast ltd uni right inc axilla IMPRESSION: 1. Calcifications at site of biopsy proven malignancy in the inner right breast as well as additional smaller group of calcifications in the slightly outer right breast appear overall stable. 2. New  mass in the upper inner right breast at the 2 o'clock location possibly related to post biopsy change although papilloma or additional site of malignancy cannot be excluded.      09/18/2016 Mammogram    Diagnostic mammogram tomo uni and [place]: RECOMMENDATION: 1. Bilateral diagnostic mammogram and right breast ultrasound is recommended in September of 2018.  2. Clinical follow-up recommended for the painful area of concern in the right breast. Any further workup should be based on clinical grounds.  I have discussed the findings and recommendations with the patient. Results were also provided in writing at the conclusion of the visit. If applicable, a reminder letter will be sent to the patient regarding the next appointment.         CANCER STAGING: Cancer Staging Hormone receptor positive breast cancer, right ([place]) Staging form: Breast, AJCC 8th Edition - Clinical stage from 01/19/2016: cT3, cN0, cM0, G3, ER: Positive, PR: Positive, HER2: Positive - Signed by Baird Cancer, PA-C on 05/04/2016    INTERVAL HISTORY:  Ms. Spirito 84 y.o. female returns for routine follow-up for stage II invasive ductal carcinoma of the right breast. Patient brought here today by her sister whom she lives with. Patient stays at home mostly she does do all her own ADLs. She occasionally helps cook. She will ride with her sister to the grocery store. She walks short distances with her cane. She feels good no other complaints. Denies any new pains. Denies any SOB or chest pain. Denies nausea, vomiting, diarrhea.     REVIEW OF SYSTEMS:  Review of Systems  Constitutional: Positive for fatigue.  Neurological: Positive for extremity  weakness.  All other systems reviewed and are negative.    PAST MEDICAL/SURGICAL HISTORY:  Past Medical History:  Diagnosis Date  . Breast cancer ([place])   . DVT (deep venous thrombosis) ([place])    bilateral  . Hormone receptor positive breast cancer, right ([place])  02/12/2016  . Hypertension   . Paralysis [facility])    Past Surgical History:  Procedure Laterality Date  . APPENDECTOMY    . HERNIA REPAIR       SOCIAL HISTORY:  Social History   Socioeconomic History  . Marital status: Single    Spouse name: Not on file  . Number of children: Not on file  . Years of education: Not on file  . Highest education level: Not on file  Occupational History  . Not on file  Social Needs  . Financial resource strain: Not on file  . Food insecurity:    Worry: Not on file    Inability: Not on file  . Transportation needs:    Medical: Not on file    Non-medical: Not on file  Tobacco Use  . Smoking status: Never Smoker  . Smokeless tobacco: Never Used  Substance and Sexual Activity  . Alcohol use: No  . Drug use: No  . Sexual activity: Never  Lifestyle  . Physical activity:    Days per week: Not on file    Minutes per session: Not on file  . Stress: Not on file  Relationships  . Social connections:    Talks on phone: Not on file    Gets together: Not on file    Attends religious service: Not on file    Active member of club or organization: Not on file    Attends meetings of clubs or organizations: Not on file    Relationship status: Not on file  . Intimate partner violence:    Fear of current or ex partner: Not on file    Emotionally abused: Not on file    Physically abused: Not on file    Forced sexual activity: Not on file  Other Topics Concern  . Not on file  Social History Narrative  . Not on file    FAMILY HISTORY:  Family History  Problem Relation Age of Onset  . Heart failure Father     CURRENT MEDICATIONS:  Outpatient Encounter Medications as of 11/19/2017  Medication Sig  . amLODipine (NORVASC) 5 MG tablet Take 5 mg by mouth daily.   Marland Kitchen anastrozole (ARIMIDEX) 1 MG tablet Take 1 tablet (1 mg total) by mouth daily.  Marland Kitchen aspirin EC 81 MG tablet Take 81 mg by mouth at bedtime.  . baclofen (LIORESAL) 10 MG tablet Take 10 mg by  mouth 2 (two) times daily as needed for muscle spasms (cramps).  . latanoprost (XALATAN) 0.005 % ophthalmic solution Place 1 drop into both eyes at bedtime.  Marland Kitchen lisinopril-hydrochlorothiazide (PRINZIDE,ZESTORETIC) 20-25 MG tablet Take 1 tablet by mouth daily.  . Rivaroxaban 15 & 20 MG TBPK Take as directed on package: Start with one 57m tablet by mouth twice a day with food. On Day 22, switch to one 244mtablet once a day with food.   No facility-administered encounter medications on file as of 11/19/2017.     ALLERGIES:  No Known Allergies   PHYSICAL EXAM:  ECOG Performance status: 1  Vitals:   11/19/17 1458  BP: (!) 156/48  Pulse: (!) 52  Resp: 18  SpO2: 100%   Filed Weights   11/19/17 1458  Weight: 109 lb (49.4 kg)    Physical Exam Lymphadenopathy: No palpable supraclavicular axillary lymphadenopathy. Breast exam: No palpable masses in bilateral breast. Chest: Bilaterally clear to auscultation. CVS: S1-S2 regular rate and rhythm.  LABORATORY DATA:  I have reviewed the labs as listed.  CBC    Component Value Date/Time   WBC 3.8 (L) 07/18/2017 1358   RBC 3.43 (L) 07/18/2017 1358   HGB 9.3 (L) 07/18/2017 1358   HCT 30.1 (L) 07/18/2017 1358   PLT 171 07/18/2017 1358   MCV 87.8 07/18/2017 1358   MCH 27.1 07/18/2017 1358   MCHC 30.9 07/18/2017 1358   RDW 16.4 (H) 07/18/2017 1358   LYMPHSABS 1.8 07/18/2017 1358   MONOABS 0.2 07/18/2017 1358   EOSABS 0.4 07/18/2017 1358   BASOSABS 0.0 07/18/2017 1358   CMP Latest Ref Rng & Units 07/18/2017 11/13/2016 09/20/2016  Glucose 65 - 99 mg/dL 96 112(H) 81  BUN 6 - 20 mg/dL 39(H) 36(H) 36(H)  Creatinine 0.44 - 1.00 mg/dL 1.36(H) 1.22(H) 1.49(H)  Sodium 135 - 145 mmol/L 141 136 140  Potassium 3.5 - 5.1 mmol/L 4.1 4.7 4.2  Chloride 101 - 111 mmol/L 106 98(L) 104  CO2 22 - 32 mmol/L _0 Calcium 8.9 - 10.3 mg/dL 9.1 10.3 9.6  Total Protein 6.5 - 8.1 g/dL 6.7 8.2(H) 7.3  Total Bilirubin 0.3 - 1.2 mg/dL 0.6 0.7 0.5    Alkaline Phos 38 - 126 U/L 83 75 72  AST 15 - 41 U/L _1 ALT 14 - 54 U/L _2 DIAGNOSTIC IMAGING:  I have independently reviewed mammogram from 09/17/2017 and DEXA scan from the same day.  I discussed the results with the patient.     ASSESSMENT & PLAN:   Hormone receptor positive breast cancer, right ([place]) 1.  Right breast triple positive carcinoma: - Biopsy on 01/18/2016 of the medial right breast nodule, infiltrating ductal carcinoma, grade 3, Ki-67 of 5%, triple receptor positive. - Declined surgery and anti-HER-2 therapy.  Arimidex started in October 2017. - Mammogram dated 09/17/2017 shows stable mammographic appearance of biopsy-proven malignancy in the medial right breast. - She is tolerating anastrozole very well.  Today's physical examination did not reveal any masses or palpable adenopathy.  She is scheduled for another mammogram in September and follow-up after that.  2.  Osteoporosis: -DEXA scan on 09/17/2017 shows T score of -3.9. -We talked about initiating her on Prolia 60 mg every 6 months.  We talked about side effects including but not limited to hypocalcemia, flulike symptoms and rare chance of osteonecrosis of the jaw.  She is agreeable to this.  She was also instructed to take calcium and vitamin D twice daily.  We will get prior authorization from her insurance.  3.  Normocytic anemia: -This is from a combination of chronic kidney disease and relative iron deficiency.  We will send her iron panel today.  She was told to start taking 1 iron tablet daily along with stool softener.  We plan to repeat labs in 2 months.      Orders placed this encounter:  Orders Placed This Encounter  Procedures  . CBC with Differential/Platelet  . Comprehensive metabolic panel  . Ferritin  . Iron and TIBC      Derek Jack, MD [place] [phone]

## 2017-11-19 NOTE — Addendum Note (Signed)
Addended by: Glennie Isle on: 11/19/2017 04:06 PM   Modules accepted: Orders

## 2017-11-19 NOTE — Assessment & Plan Note (Signed)
1.  Right breast triple positive carcinoma: - Biopsy on 01/18/2016 of the medial right breast nodule, infiltrating ductal carcinoma, grade 3, Ki-67 of 5%, triple receptor positive. - Declined surgery and anti-HER-2 therapy.  Arimidex started in October 2017. - Mammogram dated 09/17/2017 shows stable mammographic appearance of biopsy-proven malignancy in the medial right breast. - She is tolerating anastrozole very well.  Today's physical examination did not reveal any masses or palpable adenopathy.  She is scheduled for another mammogram in September and follow-up after that.  2.  Osteoporosis: -DEXA scan on 09/17/2017 shows T score of -3.9. -We talked about initiating her on Prolia 60 mg every 6 months.  We talked about side effects including but not limited to hypocalcemia, flulike symptoms and rare chance of osteonecrosis of the jaw.  She is agreeable to this.  She was also instructed to take calcium and vitamin D twice daily.  We will get prior authorization from her insurance.  3.  Normocytic anemia: -This is from a combination of chronic kidney disease and relative iron deficiency.  We will send her iron panel today.  She was told to start taking 1 iron tablet daily along with stool softener.  We plan to repeat labs in 2 months.

## 2017-12-14 IMAGING — MG 2D DIGITAL DIAGNOSTIC BILATERAL MAMMOGRAM WITH CAD AND ADJUNCT T
6 of 9 series · 6 of 25 positions shown · non-contrast
Comparison: Previous exam(s).

CLINICAL DATA: [AGE] patient presents for evaluation of both
breasts. She has biopsy proven grade 3 ductal carcinoma in situ with
small focus of invasive carcinoma following stereotactic biopsy of
calcifications in the medial right breast. The patient opted for
medical treatment only. No surgery was performed. She presents for
annual evaluation of the left breast and short-term follow-up of the
right breast. Previously, on ultrasound of the right breast in August 2016, fat necrosis was noted in the 2 o'clock position right breast
3 cm from the nipple and ultrasound of the right breast is suggested
for today's follow-up exam.

EXAM:
2D DIGITAL DIAGNOSTIC BILATERAL MAMMOGRAM WITH CAD AND ADJUNCT TOMO
ULTRASOUND RIGHT BREAST

[L CC (1 of 2)]
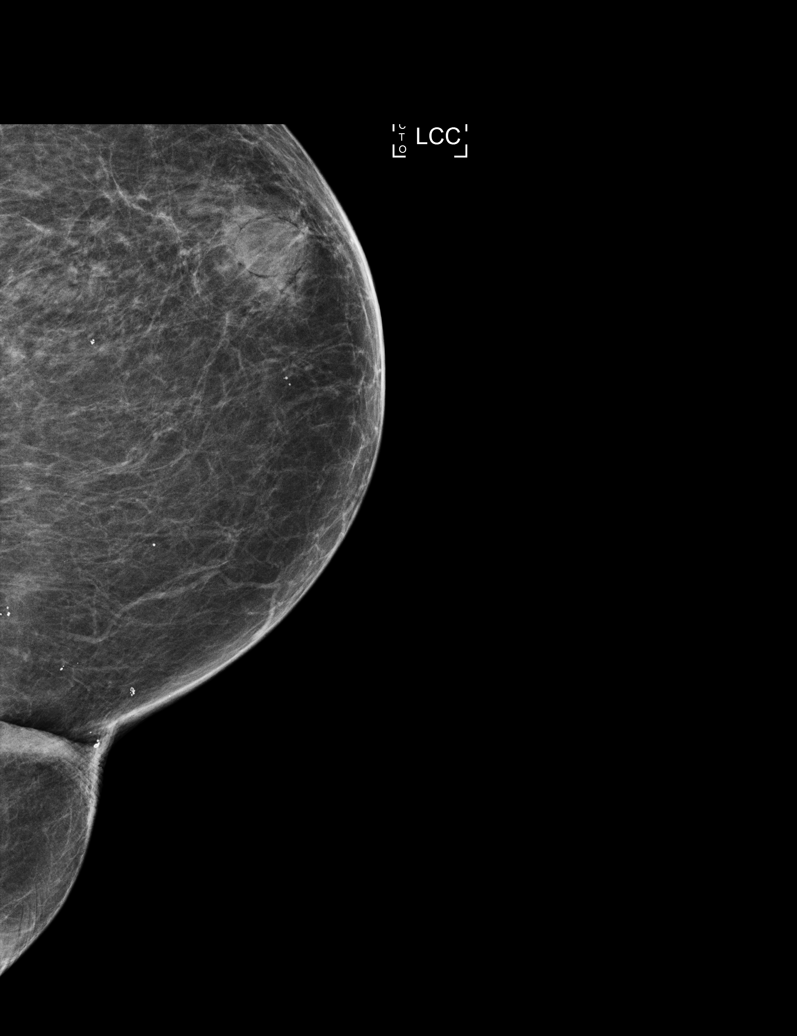

[L MLO]
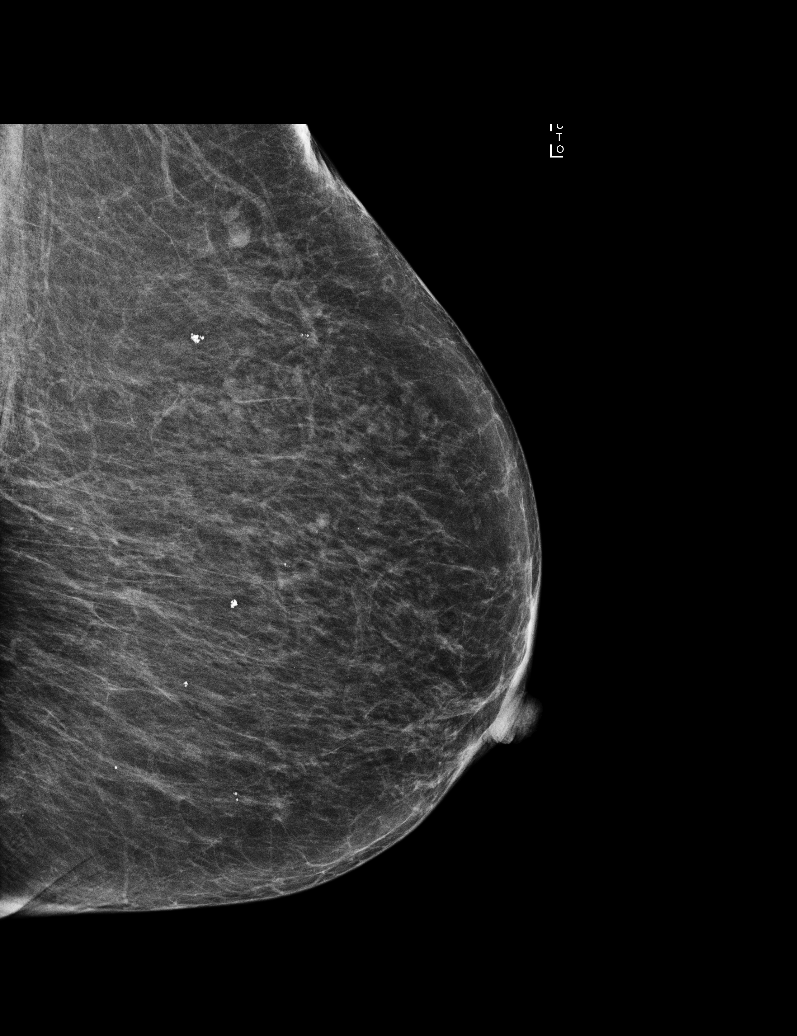

[R CC]
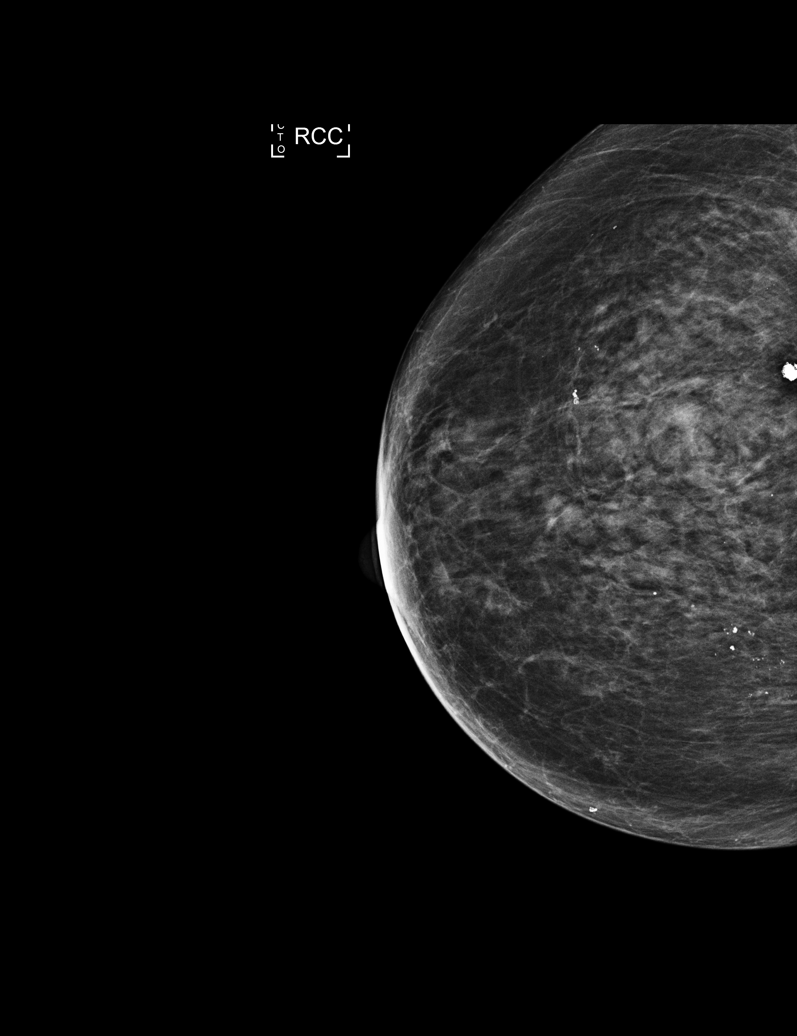

[R MLO]
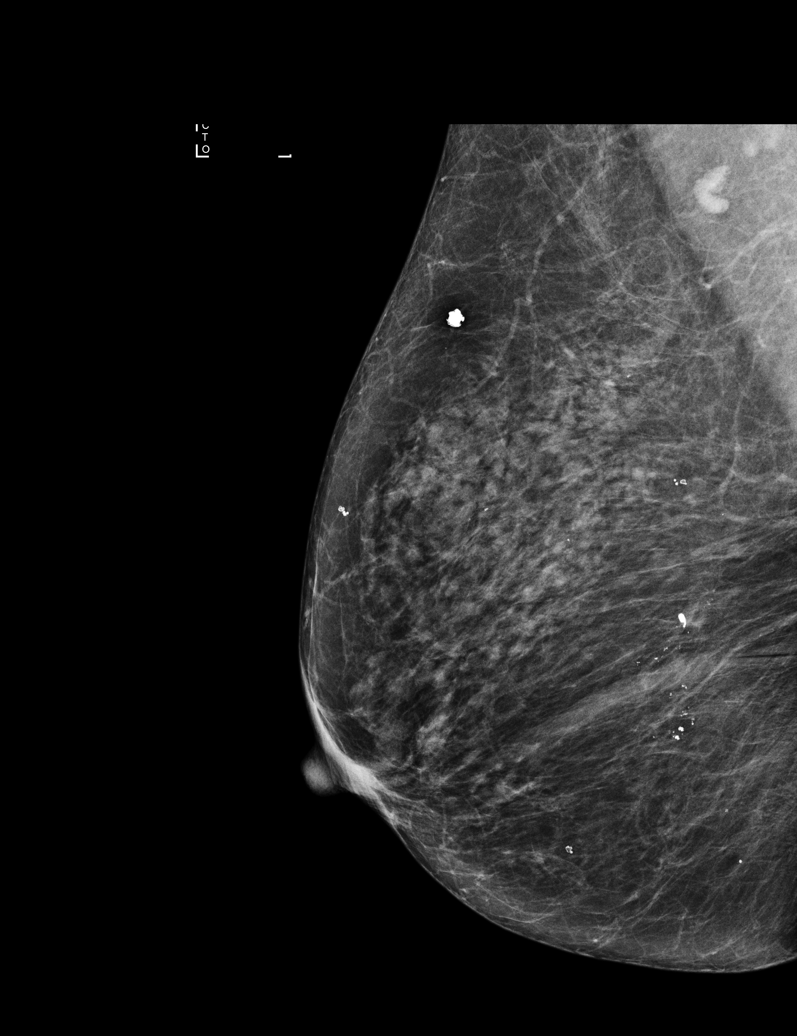

[L CC (2 of 2)]
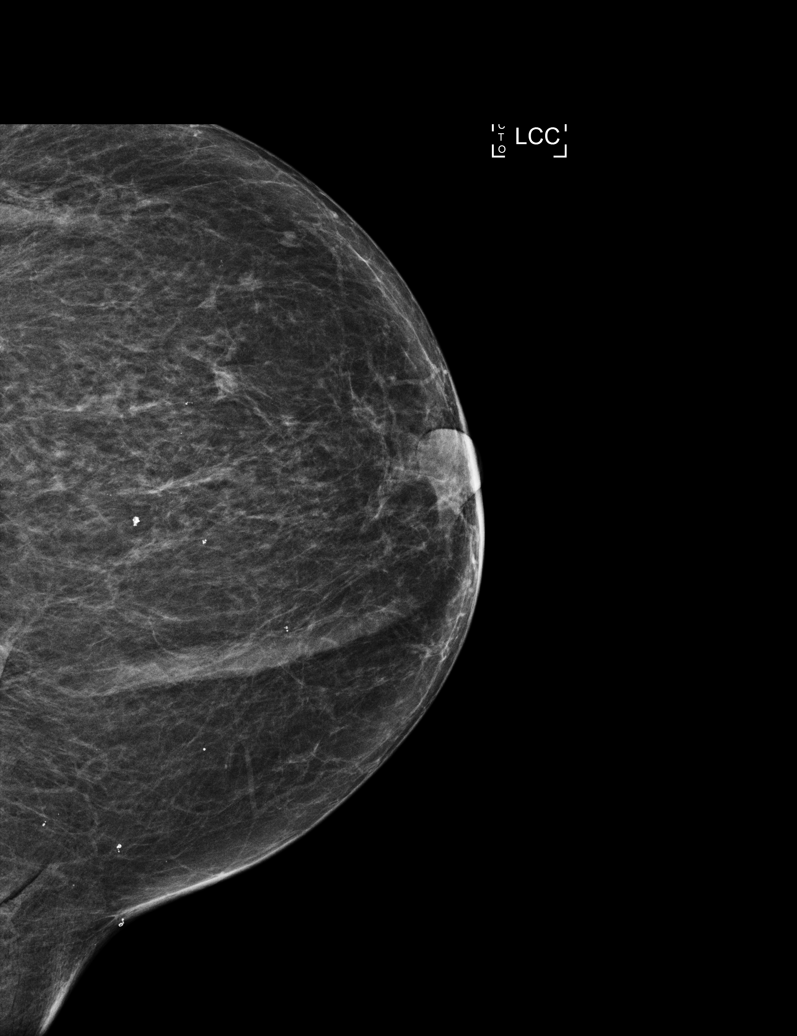

[L MLO tomo · tomo slice 20/39.0]
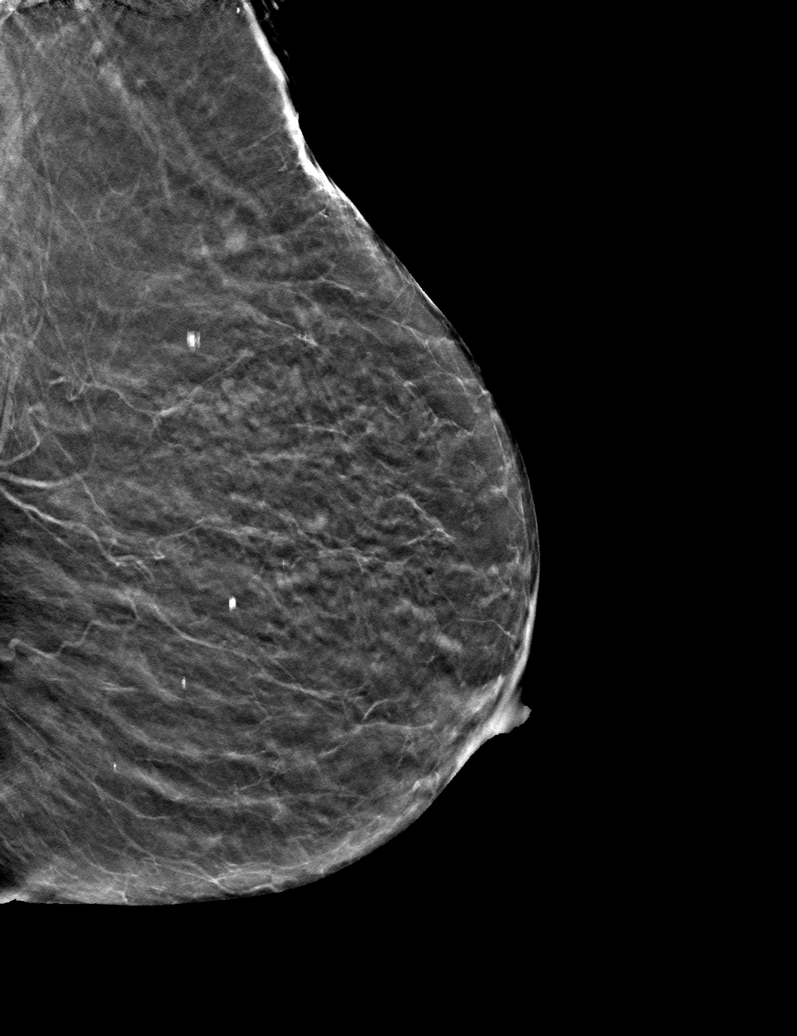

[6 of 25 positions shown; findings below may reference images not displayed]

ACR Breast Density Category b: There are scattered areas of
fibroglandular density.
FINDINGS: Images are to the best of the patient and technologist's abilities.
The patient is in a wheelchair. Coarse calcifications in the medial
right breast, in the region of the previous biopsy, are
mammographically stable. The biopsy clip is only visualized on the
MLO projection today, due to difficulty positioning the patient in
the CC projection, secondary to limited patient mobility. Additional
benign appearing calcifications scattered in the right breast are
stable. No new mass, new suspicious microcalcification, or
architectural distortion is identified in either breast.

There no findings to suggest malignancy on the left.

Mammographic images were processed with CAD.

Targeted ultrasound is performed, showing normal breast parenchyma
in the 2 o'clock position of the right breast. No mass or fat
necrosis changes are seen on ultrasound today.
IMPRESSION: Biopsy proven malignancy in the medial right breast, without
mammographic change.

Ultrasound of the 2 o'clock region of the right breast is negative.

No evidence of malignancy in the left breast.

RECOMMENDATION:
Continued clinical follow-up, and follow-up mammography as indicated
to assess treatment response.

I have discussed the findings and recommendations with the patient.
Results were also provided in writing at the conclusion of the
visit. If applicable, a reminder letter will be sent to the patient
regarding the next appointment.

BI-RADS CATEGORY  6: Known biopsy-proven malignancy.

## 2017-12-31 ENCOUNTER — Encounter (HOSPITAL_COMMUNITY): Payer: Self-pay

## 2018-01-09 DIAGNOSIS — I10 Essential (primary) hypertension: Secondary | ICD-10-CM | POA: Diagnosis not present

## 2018-01-09 DIAGNOSIS — M159 Polyosteoarthritis, unspecified: Secondary | ICD-10-CM | POA: Diagnosis not present

## 2018-01-09 DIAGNOSIS — G8190 Hemiplegia, unspecified affecting unspecified side: Secondary | ICD-10-CM | POA: Diagnosis not present

## 2018-01-16 ENCOUNTER — Inpatient Hospital Stay (HOSPITAL_COMMUNITY): Payer: Medicare HMO | Attending: Internal Medicine

## 2018-01-16 ENCOUNTER — Other Ambulatory Visit: Payer: Self-pay

## 2018-01-16 ENCOUNTER — Inpatient Hospital Stay (HOSPITAL_BASED_OUTPATIENT_CLINIC_OR_DEPARTMENT_OTHER): Payer: Medicare HMO | Admitting: Internal Medicine

## 2018-01-16 ENCOUNTER — Other Ambulatory Visit (HOSPITAL_COMMUNITY): Payer: Self-pay

## 2018-01-16 ENCOUNTER — Ambulatory Visit (HOSPITAL_COMMUNITY): Payer: Self-pay | Admitting: Adult Health

## 2018-01-16 ENCOUNTER — Inpatient Hospital Stay (HOSPITAL_COMMUNITY): Payer: Medicare HMO

## 2018-01-16 ENCOUNTER — Encounter (HOSPITAL_COMMUNITY): Payer: Self-pay | Admitting: Internal Medicine

## 2018-01-16 VITALS — BP 173/52 | HR 45 | Resp 16

## 2018-01-16 DIAGNOSIS — Z170 Estrogen receptor positive status [ER+]: Secondary | ICD-10-CM | POA: Insufficient documentation

## 2018-01-16 DIAGNOSIS — Z79811 Long term (current) use of aromatase inhibitors: Secondary | ICD-10-CM | POA: Insufficient documentation

## 2018-01-16 DIAGNOSIS — N189 Chronic kidney disease, unspecified: Secondary | ICD-10-CM

## 2018-01-16 DIAGNOSIS — D631 Anemia in chronic kidney disease: Secondary | ICD-10-CM | POA: Insufficient documentation

## 2018-01-16 DIAGNOSIS — I129 Hypertensive chronic kidney disease with stage 1 through stage 4 chronic kidney disease, or unspecified chronic kidney disease: Secondary | ICD-10-CM

## 2018-01-16 DIAGNOSIS — C50911 Malignant neoplasm of unspecified site of right female breast: Secondary | ICD-10-CM | POA: Diagnosis not present

## 2018-01-16 DIAGNOSIS — Z86718 Personal history of other venous thrombosis and embolism: Secondary | ICD-10-CM | POA: Insufficient documentation

## 2018-01-16 DIAGNOSIS — C50211 Malignant neoplasm of upper-inner quadrant of right female breast: Secondary | ICD-10-CM | POA: Insufficient documentation

## 2018-01-16 DIAGNOSIS — D649 Anemia, unspecified: Secondary | ICD-10-CM

## 2018-01-16 DIAGNOSIS — Z79899 Other long term (current) drug therapy: Secondary | ICD-10-CM

## 2018-01-16 DIAGNOSIS — Z7901 Long term (current) use of anticoagulants: Secondary | ICD-10-CM | POA: Insufficient documentation

## 2018-01-16 DIAGNOSIS — M810 Age-related osteoporosis without current pathological fracture: Secondary | ICD-10-CM

## 2018-01-16 DIAGNOSIS — Z8249 Family history of ischemic heart disease and other diseases of the circulatory system: Secondary | ICD-10-CM

## 2018-01-16 DIAGNOSIS — Z7982 Long term (current) use of aspirin: Secondary | ICD-10-CM

## 2018-01-16 DIAGNOSIS — M818 Other osteoporosis without current pathological fracture: Secondary | ICD-10-CM

## 2018-01-16 LAB — COMPREHENSIVE METABOLIC PANEL
ALBUMIN: 3.7 g/dL (ref 3.5–5.0)
ALT: 10 U/L (ref 0–44)
AST: 19 U/L (ref 15–41)
Alkaline Phosphatase: 68 U/L (ref 38–126)
Anion gap: 8 (ref 5–15)
BUN: 41 mg/dL — ABNORMAL HIGH (ref 8–23)
CALCIUM: 9.2 mg/dL (ref 8.9–10.3)
CO2: 27 mmol/L (ref 22–32)
CREATININE: 1.63 mg/dL — AB (ref 0.44–1.00)
Chloride: 108 mmol/L (ref 98–111)
GFR calc Af Amer: 30 mL/min — ABNORMAL LOW (ref >60)
GFR calc non Af Amer: 26 mL/min — ABNORMAL LOW (ref >60)
Glucose, Bld: 85 mg/dL (ref 70–99)
Potassium: 3.9 mmol/L (ref 3.5–5.1)
SODIUM: 143 mmol/L (ref 135–145)
Total Bilirubin: 0.4 mg/dL (ref 0.3–1.2)
Total Protein: 7.2 g/dL (ref 6.5–8.1)

## 2018-01-16 LAB — CBC WITH DIFFERENTIAL/PLATELET
BASOS PCT: 1 %
Basophils Absolute: 0 10*3/uL (ref 0.0–0.1)
EOS ABS: 0.3 10*3/uL (ref 0.0–0.7)
Eosinophils Relative: 8 %
HEMATOCRIT: 31.8 % — AB (ref 36.0–46.0)
Hemoglobin: 9.8 g/dL — ABNORMAL LOW (ref 12.0–15.0)
LYMPHS ABS: 1.8 10*3/uL (ref 0.7–4.0)
Lymphocytes Relative: 44 %
MCH: 27.5 pg (ref 26.0–34.0)
MCHC: 30.8 g/dL (ref 30.0–36.0)
MCV: 89.3 fL (ref 78.0–100.0)
MONOS PCT: 8 %
Monocytes Absolute: 0.3 10*3/uL (ref 0.1–1.0)
NEUTROS ABS: 1.6 10*3/uL — AB (ref 1.7–7.7)
Neutrophils Relative %: 39 %
Platelets: 198 10*3/uL (ref 150–400)
RBC: 3.56 MIL/uL — ABNORMAL LOW (ref 3.87–5.11)
RDW: 16.1 % — AB (ref 11.5–15.5)
WBC: 4.1 10*3/uL (ref 4.0–10.5)

## 2018-01-16 MED ORDER — DENOSUMAB 60 MG/ML ~~LOC~~ SOSY
60.0000 mg | PREFILLED_SYRINGE | Freq: Once | SUBCUTANEOUS | Status: AC
  Administered 2018-01-16: 60 mg via SUBCUTANEOUS
  Filled 2018-01-16: qty 1

## 2018-01-16 NOTE — Patient Instructions (Signed)
[name] at [facility]  Discharge Instructions:  You were given a prolia shot today with handouts given in the office.  _______________________________________________________________  Thank you for choosing [place] at [facility] to provide your oncology and hematology care.  To afford each patient quality time with our providers, please arrive at least 15 minutes before your scheduled appointment.  You need to re-schedule your appointment if you arrive 10 or more minutes late.  We strive to give you quality time with our providers, and arriving late affects you and other patients whose appointments are after yours.  Also, if you no show three or more times for appointments you may be dismissed from the clinic.  Again, thank you for choosing [place] at [facility] hope is that these requests will allow you access to exceptional care and in a timely manner. _______________________________________________________________  If you have questions after your visit, please contact our office at [phone] between the hours of 8:30 a.m. and 5:00 p.m. Voicemails left after 4:30 p.m. will not be returned until the following business day. _______________________________________________________________  For prescription refill requests, have your pharmacy contact our office. _______________________________________________________________  Recommendations made by the consultant and any test results will be sent to your referring physician. _______________________________________________________________

## 2018-01-16 NOTE — Progress Notes (Signed)
Diagnosis HER2-positive carcinoma of right breast ([place]) - Plan: CBC with Differential/Platelet, Comprehensive metabolic panel, Lactate dehydrogenase  Staging Cancer Staging Hormone receptor positive breast cancer, right ([place]) Staging form: Breast, AJCC 8th Edition - Clinical stage from 01/19/2016: cT3, cN0, cM0, G3, ER: Positive, PR: Positive, HER2: Positive - Signed by Baird Cancer, PA-C on 05/04/2016   Assessment and Plan:  Hormone receptor positive breast cancer, right ([place]) 1.  Right breast triple positive carcinoma: - Biopsy on 01/18/2016 of the medial right breast nodule, infiltrating ductal carcinoma, grade 3, Ki-67 of 5%, triple receptor positive. - Declined surgery and anti-HER-2 therapy.  Arimidex started in October 2017. - Mammogram dated 09/17/2017 shows stable mammographic appearance of biopsy-proven malignancy in the medial right breast. - She is tolerating anastrozole very well.  Pt was recommended for bilateral diagnostic mammogram in 12/2017.  Pt has not had mammogram performed.  Office informed  to reschedule.  Pt will be seen for follow-up by Dr. Worthy Keeler in 03/2018.   2.  Osteoporosis: DEXA scan on 09/17/2017 showed osteoporosis.  She was recommended for Prolia.  Family member indicates pt was taking Fosamax.  I have discussed with them pt will have to take either Prolia or Fosamax but not both.  She will receive Prolia today and will continue therapy every 6 months.  Continue calcium and vitamin D.    3.  Normocytic anemia.  Labs done 01/16/2018 reviewed and showed WBC 4.1 HB 9.8 Plts 198,000.  Chemistries WNL with K+ 3.9, Cr 1.63 and normal LFTs.  Likely related to CKD.    4.  CKD.  Cr 1.63.  Will repeat labs on RTC.  Interval History:  Historical data obtained from note dated 11/19/2017.   Right breast triple positive carcinoma: - Biopsy on 01/18/2016 of the medial right breast nodule, infiltrating ductal carcinoma, grade 3, Ki-67 of 5%, triple receptor positive. -  Declined surgery and anti-HER-2 therapy.  Arimidex started in October 2017. - Mammogram dated 09/17/2017 shows stable mammographic appearance of biopsy-proven malignancy in the medial right breast.  Current Status:  Family member reports pt has continued to take Fosamax.  She has not done mammogram.      Hormone receptor positive breast cancer, right ([place])   01/04/2016 Mammogram    Screening mammogram, calcifications in R breast    01/12/2016 Mammogram    Diagnostic mammogram There are coarse and heterogeneous calcifications in the inferior medial right breast spanning 5.1 cm. There are some scattered similar-appearing calcifications behind the nipple at a posterior depth as well    01/18/2016 Mammogram    Mammographic images were obtained following stereotactic guided biopsy of right breast calcifications. The coil shaped marker is in good position.  IMPRESSION: Appropriate placement of biopsy marker    01/18/2016 Initial Biopsy    Stereotactic-guided biopsy of right breast calcifications. No apparent complications    12/15/5629 Pathology Results    Breast, right, needle core biopsy, medial SMALL FOCUS OF INVASIVE DUCTAL CARCINOMA, DUCTAL CARCINOMA IN SITU WITH CALCIFICATIONS AND NECROSIS, GRADE 3 Estrogen Receptor: 100%, POSITIVE, STRONG STAINING INTENSITY Progesterone Receptor: 60%, POSITIVE, STRONG STAINING INTENSITY Proliferation Marker Ki67: 5% HER2 - **POSITIVE** RATIO OF HER2/CEP17 SIGNALS 2.60 AVERAGE HER2 COPY NUMBER PER CELL 3.90    02/08/2016 -  Anti-estrogen oral therapy       06/05/2016 Breast US    Diagnostic mammogram tomo uni right & US breast ltd uni right inc axilla IMPRESSION: 1. Calcifications at site of biopsy proven malignancy in the inner right breast as  well as additional smaller group of calcifications in the slightly outer right breast appear overall stable. 2. New mass in the upper inner right breast at the 2 o'clock location possibly related to  post biopsy change although papilloma or additional site of malignancy cannot be excluded.    09/18/2016 Mammogram    Diagnostic mammogram tomo uni and [place]: RECOMMENDATION: 1. Bilateral diagnostic mammogram and right breast ultrasound is recommended in September of 2018.  2. Clinical follow-up recommended for the painful area of concern in the right breast. Any further workup should be based on clinical grounds.  I have discussed the findings and recommendations with the patient. Results were also provided in writing at the conclusion of the visit. If applicable, a reminder letter will be sent to the patient regarding the next appointment.       Problem List Patient Active Problem List   Diagnosis Date Noted  . Osteoporosis [M81.0] 11/19/2017  . Elevated troponin [R74.8] 11/14/2016  . Nausea & vomiting [R11.2] 11/14/2016  . Hormone receptor positive breast cancer, right ([place]) [C50.911] 02/12/2016  . 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 SCIATICA [M54.30] 09/08/2009  . SCOLIOSIS, LUMBAR SPINE [M41.20] 09/08/2009    Past Medical History Past Medical History:  Diagnosis Date  . Breast cancer ([place])   . DVT (deep venous thrombosis) ([place])    bilateral  . Hormone receptor positive breast cancer, right ([place]) 02/12/2016  . Hypertension   . Paralysis [facility])     Past Surgical History Past Surgical History:  Procedure Laterality Date  . APPENDECTOMY    . HERNIA REPAIR      Family History Family History  Problem Relation Age of Onset  . Heart failure Father      Social History  reports that she has never smoked. She has never used smokeless tobacco. She reports that she does not drink alcohol or use drugs.  Medications  Current Outpatient Medications:  .  alendronate (FOSAMAX) 70 MG tablet, , Disp: , Rfl:  .  amLODipine (NORVASC) 5 MG tablet, Take 5 mg by mouth daily. , Disp: , Rfl:  .  anastrozole (ARIMIDEX) 1 MG tablet, Take 1 tablet (1  mg total) by mouth daily., Disp: 90 tablet, Rfl: 1 .  aspirin EC 81 MG tablet, Take 81 mg by mouth at bedtime., Disp: , Rfl:  .  baclofen (LIORESAL) 10 MG tablet, Take 10 mg by mouth 2 (two) times daily as needed for muscle spasms (cramps)., Disp: , Rfl:  .  lisinopril-hydrochlorothiazide (PRINZIDE,ZESTORETIC) 20-25 MG tablet, Take 1 tablet by mouth daily., Disp: , Rfl:  .  XARELTO 20 MG TABS tablet, , Disp: , Rfl:   Allergies Patient has no known allergies.  Review of Systems Review of Systems - Oncology ROS negative.     Physical Exam  Vitals Wt Readings from Last 3 Encounters:  11/19/17 109 lb (49.4 kg)  07/18/17 107 lb 14.4 oz (48.9 kg)  11/14/16 102 lb 1.2 oz (46.3 kg)   Temp Readings from Last 3 Encounters:  07/18/17 98.6 F (37 C) (Oral)  11/15/16 97.9 F (36.6 C) (Oral)   BP Readings from Last 3 Encounters:  01/16/18 (!) 173/52  11/19/17 (!) 156/48  07/18/17 (!) 174/52   Pulse Readings from Last 3 Encounters:  01/16/18 (!) 45  11/19/17 (!) 52  07/18/17 92    Constitutional: Well-developed, well-nourished, and in no distress.   HENT: Head: Normocephalic and atraumatic.  Mouth/Throat: No oropharyngeal exudate. Mucosa moist. Eyes: Pupils are  equal, round, and reactive to light. Conjunctivae are normal. No scleral icterus.  Neck: Normal range of motion. Neck supple. No JVD present.  Cardiovascular: Normal rate, regular rhythm and normal heart sounds.  Exam reveals no gallop and no friction rub.   No murmur heard. Pulmonary/Chest: Effort normal and breath sounds normal. No respiratory distress. No wheezes.No rales.  Abdominal: Soft. Bowel sounds are normal. No distension. There is no tenderness. There is no guarding.  Musculoskeletal: No edema or tenderness.  Lymphadenopathy: No cervical, axillary or supraclavicular adenopathy.  Neurological: Alert and oriented to person, place, and time. No cranial nerve deficit.  Skin: Skin is warm and dry. No rash noted. No  erythema. No pallor.  Psychiatric: Affect and judgment normal.   Labs Appointment on 01/16/2018  Component Date Value Ref Range Status  . Sodium 01/16/2018 143  135 - 145 mmol/L Final  . Potassium 01/16/2018 3.9  3.5 - 5.1 mmol/L Final  . Chloride 01/16/2018 108  98 - 111 mmol/L Final  . CO2 01/16/2018 27  22 - 32 mmol/L Final  . Glucose, Bld 01/16/2018 85  70 - 99 mg/dL Final  . BUN 01/16/2018 41* 8 - 23 mg/dL Final  . Creatinine, Ser 01/16/2018 1.63* 0.44 - 1.00 mg/dL Final  . Calcium 01/16/2018 9.2  8.9 - 10.3 mg/dL Final  . Total Protein 01/16/2018 7.2  6.5 - 8.1 g/dL Final  . Albumin 01/16/2018 3.7  3.5 - 5.0 g/dL Final  . AST 01/16/2018 19  15 - 41 U/L Final  . ALT 01/16/2018 10  0 - 44 U/L Final  . Alkaline Phosphatase 01/16/2018 68  38 - 126 U/L Final  . Total Bilirubin 01/16/2018 0.4  0.3 - 1.2 mg/dL Final  . GFR calc non Af Amer 01/16/2018 26* >60 mL/min Final  . GFR calc Af Amer 01/16/2018 30* >60 mL/min Final   Comment: (NOTE) The eGFR has been calculated using the CKD EPI equation. This calculation has not been validated in all clinical situations. eGFR's persistently <60 mL/min signify possible Chronic Kidney Disease.   Georgiann Hahn gap 01/16/2018 8  5 - 15 Final   Performed at [facility], [address], [place], [place] [address]  . WBC 01/16/2018 4.1  4.0 - 10.5 K/uL Final  . RBC 01/16/2018 3.56* 3.87 - 5.11 MIL/uL Final  . Hemoglobin 01/16/2018 9.8* 12.0 - 15.0 g/dL Final  . HCT 01/16/2018 31.8* 36.0 - 46.0 % Final  . MCV 01/16/2018 89.3  78.0 - 100.0 fL Final  . MCH 01/16/2018 27.5  26.0 - 34.0 pg Final  . MCHC 01/16/2018 30.8  30.0 - 36.0 g/dL Final  . RDW 01/16/2018 16.1* 11.5 - 15.5 % Final  . Platelets 01/16/2018 198  150 - 400 K/uL Final  . Neutrophils Relative % 01/16/2018 39  % Final  . Neutro Abs 01/16/2018 1.6* 1.7 - 7.7 K/uL Final  . Lymphocytes Relative 01/16/2018 44  % Final  . Lymphs Abs 01/16/2018 1.8  0.7 - 4.0 K/uL Final  . Monocytes Relative  01/16/2018 8  % Final  . Monocytes Absolute 01/16/2018 0.3  0.1 - 1.0 K/uL Final  . Eosinophils Relative 01/16/2018 8  % Final  . Eosinophils Absolute 01/16/2018 0.3  0.0 - 0.7 K/uL Final  . Basophils Relative 01/16/2018 1  % Final  . Basophils Absolute 01/16/2018 0.0  0.0 - 0.1 K/uL Final   Performed at [facility], [address], [place], [place] [address]     Pathology Orders Placed This Encounter  Procedures  . CBC with Differential/Platelet    Standing Status:   Future    Standing Expiration Date:   01/17/2019  . Comprehensive metabolic panel    Standing Status:   Future    Standing Expiration Date:   01/17/2019  . Lactate dehydrogenase    Standing Status:   Future    Standing Expiration Date:   01/17/2019       Zoila Shutter MD

## 2018-01-16 NOTE — Progress Notes (Signed)
Verified with the nurse practitioner ok to give the Prolia shot due to the patient took Fosamax two days ago.  Patient was instructed to stop the fosamax by the oncologist and ok to give Prolia today verbal order Francene Finders, NP.  Consent signed with information given for Prolia shot.  All questions asked and answered.  Patient denied tooth, jaw, or leg pain and will start calcium as directed. Patient tolerated injection with no complaints voiced.  Band aid applied.  VSS with discharge and left by wheelchair with family with no s/s of distress noted.

## 2018-01-17 LAB — ERYTHROPOIETIN: Erythropoietin: 24 m[IU]/mL — ABNORMAL HIGH (ref 2.6–18.5)

## 2018-01-21 ENCOUNTER — Other Ambulatory Visit (HOSPITAL_COMMUNITY): Payer: Self-pay | Admitting: Hematology

## 2018-01-21 DIAGNOSIS — C50911 Malignant neoplasm of unspecified site of right female breast: Secondary | ICD-10-CM

## 2018-01-28 ENCOUNTER — Ambulatory Visit (HOSPITAL_COMMUNITY): Payer: Medicare HMO

## 2018-01-28 ENCOUNTER — Encounter (HOSPITAL_COMMUNITY): Payer: Self-pay

## 2018-02-13 DIAGNOSIS — G8190 Hemiplegia, unspecified affecting unspecified side: Secondary | ICD-10-CM | POA: Diagnosis not present

## 2018-04-09 ENCOUNTER — Other Ambulatory Visit (HOSPITAL_COMMUNITY): Payer: Self-pay

## 2018-04-15 ENCOUNTER — Encounter (HOSPITAL_COMMUNITY): Payer: Self-pay | Admitting: Internal Medicine

## 2018-04-15 ENCOUNTER — Inpatient Hospital Stay (HOSPITAL_COMMUNITY): Payer: Medicare HMO | Attending: Internal Medicine | Admitting: Internal Medicine

## 2018-04-15 VITALS — BP 132/49 | HR 78 | Temp 98.2°F | Resp 18

## 2018-04-15 DIAGNOSIS — Z79811 Long term (current) use of aromatase inhibitors: Secondary | ICD-10-CM | POA: Diagnosis not present

## 2018-04-15 DIAGNOSIS — I10 Essential (primary) hypertension: Secondary | ICD-10-CM | POA: Diagnosis not present

## 2018-04-15 DIAGNOSIS — Z79899 Other long term (current) drug therapy: Secondary | ICD-10-CM

## 2018-04-15 DIAGNOSIS — Z7982 Long term (current) use of aspirin: Secondary | ICD-10-CM | POA: Diagnosis not present

## 2018-04-15 DIAGNOSIS — Z86718 Personal history of other venous thrombosis and embolism: Secondary | ICD-10-CM | POA: Diagnosis not present

## 2018-04-15 DIAGNOSIS — Z170 Estrogen receptor positive status [ER+]: Secondary | ICD-10-CM | POA: Diagnosis not present

## 2018-04-15 DIAGNOSIS — Z7901 Long term (current) use of anticoagulants: Secondary | ICD-10-CM | POA: Diagnosis not present

## 2018-04-15 DIAGNOSIS — G8190 Hemiplegia, unspecified affecting unspecified side: Secondary | ICD-10-CM | POA: Diagnosis not present

## 2018-04-15 DIAGNOSIS — C50911 Malignant neoplasm of unspecified site of right female breast: Secondary | ICD-10-CM | POA: Diagnosis not present

## 2018-04-15 DIAGNOSIS — N189 Chronic kidney disease, unspecified: Secondary | ICD-10-CM

## 2018-04-15 DIAGNOSIS — D631 Anemia in chronic kidney disease: Secondary | ICD-10-CM

## 2018-04-15 NOTE — Progress Notes (Signed)
Diagnosis No diagnosis found.  Staging Cancer Staging Hormone receptor positive breast cancer, right ([place]) Staging form: Breast, AJCC 8th Edition - Clinical stage from 01/19/2016: cT3, cN0, cM0, G3, ER: Positive, PR: Positive, HER2: Positive - Signed by Baird Cancer, PA-C on 05/04/2016   Assessment and Plan:  Hormone receptor positive breast cancer, right ([place]) 1.  Right breast triple positive carcinoma: - Biopsy on 01/18/2016 of the medial right breast nodule, infiltrating ductal carcinoma, grade 3, Ki-67 of 5%, triple receptor positive. - Declined surgery and anti-HER-2 therapy.  Arimidex started in October 2017. - Mammogram dated 09/17/2017 shows stable mammographic appearance of biopsy-proven malignancy in the medial right breast. - She is tolerating anastrozole very well.  Pt was recommended for bilateral diagnostic mammogram in 12/2017.  Pt has not had mammogram performed.  Office previously informed  to reschedule.  Pt will be set  Up for bilateral diagnostic mammogram in 08/2018 and will follow-up with Dr. Worthy Keeler to go over results.    2.  Osteoporosis: DEXA scan on 09/17/2017 showed osteoporosis.  She was recommended for Prolia.  Family member previously  indicated pt was taking Fosamax.  They were informed to take either Prolia or Fosamax but not both.  She will continue Prolia therapy every 6 months.  Continue calcium and vitamin D.    3.  Normocytic anemia.  Labs done 01/16/2018 reviewed and showed WBC 4.1 HB 9.8 Plts 198,000.  Chemistries WNL with K+ 3.9, Cr 1.63 and normal LFTs.  Likely related to CKD.  Refer to nephrology.    4.  CKD.  Cr 1.63.  Pt referred to nephrology.    Interval History:  Historical data obtained from note dated 11/19/2017.   Right breast triple positive carcinoma: - Biopsy on 01/18/2016 of the medial right breast nodule, infiltrating ductal carcinoma, grade 3, Ki-67 of 5%, triple receptor positive. - Declined surgery and anti-HER-2 therapy.  Arimidex  started in October 2017. - Mammogram dated 09/17/2017 shows stable mammographic appearance of biopsy-proven malignancy in the medial right breast.  Current Status:  Pt seen today for follow-up.  She has not done mammogram.     Hormone receptor positive breast cancer, right ([place])   01/04/2016 Mammogram    Screening mammogram, calcifications in R breast    01/12/2016 Mammogram    Diagnostic mammogram There are coarse and heterogeneous calcifications in the inferior medial right breast spanning 5.1 cm. There are some scattered similar-appearing calcifications behind the nipple at a posterior depth as well    01/18/2016 Mammogram    Mammographic images were obtained following stereotactic guided biopsy of right breast calcifications. The coil shaped marker is in good position.  IMPRESSION: Appropriate placement of biopsy marker    01/18/2016 Initial Biopsy    Stereotactic-guided biopsy of right breast calcifications. No apparent complications    07/31/4740 Pathology Results    Breast, right, needle core biopsy, medial SMALL FOCUS OF INVASIVE DUCTAL CARCINOMA, DUCTAL CARCINOMA IN SITU WITH CALCIFICATIONS AND NECROSIS, GRADE 3 Estrogen Receptor: 100%, POSITIVE, STRONG STAINING INTENSITY Progesterone Receptor: 60%, POSITIVE, STRONG STAINING INTENSITY Proliferation Marker Ki67: 5% HER2 - **POSITIVE** RATIO OF HER2/CEP17 SIGNALS 2.60 AVERAGE HER2 COPY NUMBER PER CELL 3.90    02/08/2016 -  Anti-estrogen oral therapy       06/05/2016 Breast US    Diagnostic mammogram tomo uni right & US breast ltd uni right inc axilla IMPRESSION: 1. Calcifications at site of biopsy proven malignancy in the inner right breast as well as additional smaller group of calcifications  in the slightly outer right breast appear overall stable. 2. New mass in the upper inner right breast at the 2 o'clock location possibly related to post biopsy change although papilloma or additional site of malignancy cannot be  excluded.    09/18/2016 Mammogram    Diagnostic mammogram tomo uni and [place]: RECOMMENDATION: 1. Bilateral diagnostic mammogram and right breast ultrasound is recommended in September of 2018.  2. Clinical follow-up recommended for the painful area of concern in the right breast. Any further workup should be based on clinical grounds.  I have discussed the findings and recommendations with the patient. Results were also provided in writing at the conclusion of the visit. If applicable, a reminder letter will be sent to the patient regarding the next appointment.       Problem List Patient Active Problem List   Diagnosis Date Noted  . Osteoporosis [M81.0] 11/19/2017  . Elevated troponin [R79.89] 11/14/2016  . Nausea & vomiting [R11.2] 11/14/2016  . Hormone receptor positive breast cancer, right ([place]) [C50.911] 02/12/2016  . 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 SCIATICA [M54.30] 09/08/2009  . SCOLIOSIS, LUMBAR SPINE [M41.20] 09/08/2009    Past Medical History Past Medical History:  Diagnosis Date  . Breast cancer ([place])   . DVT (deep venous thrombosis) ([place])    bilateral  . Hormone receptor positive breast cancer, right ([place]) 02/12/2016  . Hypertension   . Paralysis [facility])     Past Surgical History Past Surgical History:  Procedure Laterality Date  . APPENDECTOMY    . HERNIA REPAIR      Family History Family History  Problem Relation Age of Onset  . Heart failure Father      Social History  reports that she has never smoked. She has never used smokeless tobacco. She reports that she does not drink alcohol or use drugs.  Medications  Current Outpatient Medications:  .  alendronate (FOSAMAX) 70 MG tablet, , Disp: , Rfl:  .  amLODipine (NORVASC) 5 MG tablet, Take 5 mg by mouth daily. , Disp: , Rfl:  .  anastrozole (ARIMIDEX) 1 MG tablet, Take 1 tablet (1 mg total) by mouth daily., Disp: 90 tablet, Rfl: 1 .  aspirin EC 81 MG tablet,  Take 81 mg by mouth at bedtime., Disp: , Rfl:  .  baclofen (LIORESAL) 10 MG tablet, Take 10 mg by mouth 2 (two) times daily as needed for muscle spasms (cramps)., Disp: , Rfl:  .  lisinopril-hydrochlorothiazide (PRINZIDE,ZESTORETIC) 20-25 MG tablet, Take 1 tablet by mouth daily., Disp: , Rfl:  .  XARELTO 20 MG TABS tablet, , Disp: , Rfl:   Allergies Patient has no known allergies.  Review of Systems Review of Systems - Oncology ROS negative   Physical Exam  Vitals Wt Readings from Last 3 Encounters:  11/19/17 109 lb (49.4 kg)  07/18/17 107 lb 14.4 oz (48.9 kg)  11/14/16 102 lb 1.2 oz (46.3 kg)   Temp Readings from Last 3 Encounters:  04/15/18 98.2 F (36.8 C) (Oral)  07/18/17 98.6 F (37 C) (Oral)   BP Readings from Last 3 Encounters:  04/15/18 (!) 132/49  01/16/18 (!) 173/52  11/19/17 (!) 156/48   Pulse Readings from Last 3 Encounters:  04/15/18 78  01/16/18 (!) 45  11/19/17 (!) 52    Constitutional: Well-developed, well-nourished, and in no distress.   HENT: Head: Normocephalic and atraumatic.  Mouth/Throat: No oropharyngeal exudate. Mucosa moist. Eyes: Pupils are equal, round, and reactive to light. Conjunctivae are normal.  No scleral icterus.  Neck: Normal range of motion. Neck supple. No JVD present.  Cardiovascular: Normal rate, regular rhythm and normal heart sounds.  Exam reveals no gallop and no friction rub.   No murmur heard. Pulmonary/Chest: Effort normal and breath sounds normal. No respiratory distress. No wheezes.No rales.  Abdominal: Soft. Bowel sounds are normal. No distension. There is no tenderness. There is no guarding.  Musculoskeletal: No edema or tenderness.  Lymphadenopathy: No cervical, axillary or supraclavicular adenopathy.  Neurological: Alert and oriented to person, place, and time. No cranial nerve deficit.  Skin: Skin is warm and dry. No rash noted. No erythema. No pallor.  Psychiatric: Affect and judgment normal.  Breast exam:   Chaperone present.  No dominant palpable masses noted bilaterally.    Labs No visits with results within 3 Day(s) from this visit.  Latest known visit with results is:  Appointment on 01/16/2018  Component Date Value Ref Range Status  . Erythropoietin 01/16/2018 24.0* 2.6 - 18.5 mIU/mL Final   Comment: (NOTE) [employer] DxI Orin obtained with different assay methods or kits cannot be used interchangeably. Results cannot be interpreted as absolute evidence of the presence or absence of malignant disease. Performed At: [facility] [place], [place] [id number] Rush Farmer MD IR:[id number]   . Sodium 01/16/2018 143  135 - 145 mmol/L Final  . Potassium 01/16/2018 3.9  3.5 - 5.1 mmol/L Final  . Chloride 01/16/2018 108  98 - 111 mmol/L Final  . CO2 01/16/2018 27  22 - 32 mmol/L Final  . Glucose, Bld 01/16/2018 85  70 - 99 mg/dL Final  . BUN 01/16/2018 41* 8 - 23 mg/dL Final  . Creatinine, Ser 01/16/2018 1.63* 0.44 - 1.00 mg/dL Final  . Calcium 01/16/2018 9.2  8.9 - 10.3 mg/dL Final  . Total Protein 01/16/2018 7.2  6.5 - 8.1 g/dL Final  . Albumin 01/16/2018 3.7  3.5 - 5.0 g/dL Final  . AST 01/16/2018 19  15 - 41 U/L Final  . ALT 01/16/2018 10  0 - 44 U/L Final  . Alkaline Phosphatase 01/16/2018 68  38 - 126 U/L Final  . Total Bilirubin 01/16/2018 0.4  0.3 - 1.2 mg/dL Final  . GFR calc non Af Amer 01/16/2018 26* >60 mL/min Final  . GFR calc Af Amer 01/16/2018 30* >60 mL/min Final   Comment: (NOTE) The eGFR has been calculated using the CKD EPI equation. This calculation has not been validated in all clinical situations. eGFR's persistently <60 mL/min signify possible Chronic Kidney Disease.   Georgiann Hahn gap 01/16/2018 8  5 - 15 Final   Performed at [facility], [address] [place], [place] [address]  . WBC 01/16/2018 4.1  4.0 - 10.5 K/uL Final  . RBC 01/16/2018 3.56* 3.87 - 5.11 MIL/uL Final  . Hemoglobin 01/16/2018  9.8* 12.0 - 15.0 g/dL Final  . HCT 01/16/2018 31.8* 36.0 - 46.0 % Final  . MCV 01/16/2018 89.3  78.0 - 100.0 fL Final  . MCH 01/16/2018 27.5  26.0 - 34.0 pg Final  . MCHC 01/16/2018 30.8  30.0 - 36.0 g/dL Final  . RDW 01/16/2018 16.1* 11.5 - 15.5 % Final  . Platelets 01/16/2018 198  150 - 400 K/uL Final  . Neutrophils Relative % 01/16/2018 39  % Final  . Neutro Abs 01/16/2018 1.6* 1.7 - 7.7 K/uL Final  . Lymphocytes Relative 01/16/2018 44  % Final  . Lymphs Abs 01/16/2018 1.8  0.7 - 4.0  K/uL Final  . Monocytes Relative 01/16/2018 8  % Final  . Monocytes Absolute 01/16/2018 0.3  0.1 - 1.0 K/uL Final  . Eosinophils Relative 01/16/2018 8  % Final  . Eosinophils Absolute 01/16/2018 0.3  0.0 - 0.7 K/uL Final  . Basophils Relative 01/16/2018 1  % Final  . Basophils Absolute 01/16/2018 0.0  0.0 - 0.1 K/uL Final   Performed at [facility], [address], [place], [place] [address]     Pathology No orders of the defined types were placed in this encounter.      Zoila Shutter MD

## 2018-04-15 NOTE — Patient Instructions (Signed)
[facility] Discharge Instructions  You were seen by Dr. Higgs today   Thank you for choosing [facility] at [facility] to provide your oncology and hematology care.  To afford each patient quality time with our provider, please arrive at least 15 minutes before your scheduled appointment time.   If you have a lab appointment with the Cancer Center please come in thru the  Main Entrance and check in at the main information desk  You need to re-schedule your appointment should you arrive 10 or more minutes late.  We strive to give you quality time with our providers, and arriving late affects you and other patients whose appointments are after yours.  Also, if you no show three or more times for appointments you may be dismissed from the clinic at the providers discretion.     Again, thank you for choosing [facility].  Our hope is that these requests will decrease the amount of time that you wait before being seen by our physicians.       _____________________________________________________________  Should you have questions after your visit to [facility], please contact our office at [phone] between the hours of 8:00 a.m. and 4:30 p.m.  Voicemails left after 4:00 p.m. will not be returned until the following business day.  For prescription refill requests, have your pharmacy contact our office and allow 72 hours.    Cancer Center Support Programs:   > Cancer Support Group  2nd Tuesday of the month 1pm-2pm, Journey Room   

## 2018-05-16 DIAGNOSIS — G8190 Hemiplegia, unspecified affecting unspecified side: Secondary | ICD-10-CM | POA: Diagnosis not present

## 2018-06-16 DIAGNOSIS — G8190 Hemiplegia, unspecified affecting unspecified side: Secondary | ICD-10-CM | POA: Diagnosis not present

## 2018-07-17 ENCOUNTER — Ambulatory Visit (HOSPITAL_COMMUNITY): Payer: Self-pay

## 2018-07-17 ENCOUNTER — Inpatient Hospital Stay (HOSPITAL_COMMUNITY): Payer: Medicare HMO

## 2018-07-17 ENCOUNTER — Other Ambulatory Visit: Payer: Self-pay

## 2018-07-17 ENCOUNTER — Ambulatory Visit (HOSPITAL_COMMUNITY): Payer: Self-pay | Admitting: Hematology

## 2018-07-17 ENCOUNTER — Other Ambulatory Visit (HOSPITAL_COMMUNITY): Payer: Self-pay

## 2018-07-17 ENCOUNTER — Inpatient Hospital Stay (HOSPITAL_COMMUNITY): Payer: Medicare HMO | Attending: Internal Medicine

## 2018-07-17 VITALS — BP 132/64 | HR 76 | Temp 98.0°F | Resp 18

## 2018-07-17 DIAGNOSIS — Z170 Estrogen receptor positive status [ER+]: Secondary | ICD-10-CM | POA: Diagnosis not present

## 2018-07-17 DIAGNOSIS — M810 Age-related osteoporosis without current pathological fracture: Secondary | ICD-10-CM | POA: Diagnosis not present

## 2018-07-17 DIAGNOSIS — M818 Other osteoporosis without current pathological fracture: Secondary | ICD-10-CM

## 2018-07-17 DIAGNOSIS — Z1389 Encounter for screening for other disorder: Secondary | ICD-10-CM | POA: Diagnosis not present

## 2018-07-17 DIAGNOSIS — C50911 Malignant neoplasm of unspecified site of right female breast: Secondary | ICD-10-CM | POA: Diagnosis not present

## 2018-07-17 DIAGNOSIS — Z1331 Encounter for screening for depression: Secondary | ICD-10-CM | POA: Diagnosis not present

## 2018-07-17 DIAGNOSIS — Z0001 Encounter for general adult medical examination with abnormal findings: Secondary | ICD-10-CM | POA: Diagnosis not present

## 2018-07-17 DIAGNOSIS — G8190 Hemiplegia, unspecified affecting unspecified side: Secondary | ICD-10-CM | POA: Diagnosis not present

## 2018-07-17 LAB — COMPREHENSIVE METABOLIC PANEL
ALT: 9 U/L (ref 0–44)
AST: 18 U/L (ref 15–41)
Albumin: 3.9 g/dL (ref 3.5–5.0)
Alkaline Phosphatase: 56 U/L (ref 38–126)
Anion gap: 7 (ref 5–15)
BUN: 41 mg/dL — ABNORMAL HIGH (ref 8–23)
CHLORIDE: 108 mmol/L (ref 98–111)
CO2: 26 mmol/L (ref 22–32)
Calcium: 9.3 mg/dL (ref 8.9–10.3)
Creatinine, Ser: 1.26 mg/dL — ABNORMAL HIGH (ref 0.44–1.00)
GFR, EST AFRICAN AMERICAN: 42 mL/min — AB (ref >60)
GFR, EST NON AFRICAN AMERICAN: 36 mL/min — AB (ref >60)
Glucose, Bld: 78 mg/dL (ref 70–99)
Potassium: 4.3 mmol/L (ref 3.5–5.1)
Sodium: 141 mmol/L (ref 135–145)
Total Bilirubin: 0.4 mg/dL (ref 0.3–1.2)
Total Protein: 7.5 g/dL (ref 6.5–8.1)

## 2018-07-17 LAB — CBC WITH DIFFERENTIAL/PLATELET
Abs Immature Granulocytes: 0.01 10*3/uL (ref 0.00–0.07)
BASOS PCT: 1 %
Basophils Absolute: 0 10*3/uL (ref 0.0–0.1)
EOS ABS: 0.5 10*3/uL (ref 0.0–0.5)
EOS PCT: 11 %
HCT: 34.8 % — ABNORMAL LOW (ref 36.0–46.0)
Hemoglobin: 10.4 g/dL — ABNORMAL LOW (ref 12.0–15.0)
Immature Granulocytes: 0 %
Lymphocytes Relative: 34 %
Lymphs Abs: 1.5 10*3/uL (ref 0.7–4.0)
MCH: 28 pg (ref 26.0–34.0)
MCHC: 29.9 g/dL — AB (ref 30.0–36.0)
MCV: 93.5 fL (ref 80.0–100.0)
MONO ABS: 0.4 10*3/uL (ref 0.1–1.0)
MONOS PCT: 10 %
NRBC: 0 % (ref 0.0–0.2)
Neutro Abs: 1.9 10*3/uL (ref 1.7–7.7)
Neutrophils Relative %: 44 %
Platelets: 174 10*3/uL (ref 150–400)
RBC: 3.72 MIL/uL — ABNORMAL LOW (ref 3.87–5.11)
RDW: 14.4 % (ref 11.5–15.5)
WBC: 4.3 10*3/uL (ref 4.0–10.5)

## 2018-07-17 LAB — LACTATE DEHYDROGENASE: LDH: 167 U/L (ref 98–192)

## 2018-07-17 MED ORDER — DENOSUMAB 60 MG/ML ~~LOC~~ SOSY
60.0000 mg | PREFILLED_SYRINGE | Freq: Once | SUBCUTANEOUS | Status: AC
  Administered 2018-07-17: 60 mg via SUBCUTANEOUS

## 2018-07-17 NOTE — Patient Instructions (Signed)
[facility] _______________________________________________________________  Thank you for choosing [facility] to provide your oncology and hematology care.  To afford each patient quality time with our providers, please arrive at least 15 minutes before your scheduled appointment.  You need to re-schedule your appointment if you arrive 10 or more minutes late.  We strive to give you quality time with our providers, and arriving late affects you and other patients whose appointments are after yours.  Also, if you no show three or more times for appointments you may be dismissed from the clinic.  Again, thank you for choosing [facility]. Our hope is that these requests will allow you access to exceptional care and in a timely manner. _______________________________________________________________  If you have questions after your visit, please contact our office at [phone] between the hours of 8:30 a.m. and 5:00 p.m. Voicemails left after 4:30 p.m. will not be returned until the following business day. _______________________________________________________________  For prescription refill requests, have your pharmacy contact our office. _______________________________________________________________  Recommendations made by the consultant and any test results will be sent to your referring physician. _______________________________________________________________ 

## 2018-07-17 NOTE — Progress Notes (Signed)
Angela Higgins tolerated Prolia without incident or complaint. VSS. Discharged via wheelchair in satisfactory condition in company of family member.

## 2018-08-22 DIAGNOSIS — I82401 Acute embolism and thrombosis of unspecified deep veins of right lower extremity: Secondary | ICD-10-CM | POA: Diagnosis not present

## 2018-08-22 DIAGNOSIS — I10 Essential (primary) hypertension: Secondary | ICD-10-CM | POA: Diagnosis not present

## 2018-08-22 DIAGNOSIS — M810 Age-related osteoporosis without current pathological fracture: Secondary | ICD-10-CM | POA: Diagnosis not present

## 2018-09-14 DIAGNOSIS — G8190 Hemiplegia, unspecified affecting unspecified side: Secondary | ICD-10-CM | POA: Diagnosis not present

## 2018-09-15 DIAGNOSIS — L209 Atopic dermatitis, unspecified: Secondary | ICD-10-CM | POA: Diagnosis not present

## 2018-09-15 DIAGNOSIS — I10 Essential (primary) hypertension: Secondary | ICD-10-CM | POA: Diagnosis not present

## 2018-09-23 ENCOUNTER — Encounter (HOSPITAL_COMMUNITY): Payer: Self-pay

## 2018-09-23 ENCOUNTER — Ambulatory Visit (HOSPITAL_COMMUNITY): Payer: Medicare HMO

## 2018-09-23 ENCOUNTER — Inpatient Hospital Stay (HOSPITAL_COMMUNITY): Payer: Medicare HMO

## 2018-09-23 ENCOUNTER — Other Ambulatory Visit (HOSPITAL_COMMUNITY): Payer: Self-pay | Admitting: Hematology

## 2018-09-23 DIAGNOSIS — C50911 Malignant neoplasm of unspecified site of right female breast: Secondary | ICD-10-CM

## 2018-09-26 ENCOUNTER — Ambulatory Visit (HOSPITAL_COMMUNITY): Payer: Self-pay | Admitting: Hematology

## 2018-10-07 ENCOUNTER — Inpatient Hospital Stay (HOSPITAL_COMMUNITY): Payer: Medicare HMO | Attending: Hematology

## 2018-10-07 ENCOUNTER — Ambulatory Visit (HOSPITAL_COMMUNITY): Payer: Medicare HMO

## 2018-10-07 ENCOUNTER — Other Ambulatory Visit: Payer: Self-pay

## 2018-10-07 ENCOUNTER — Ambulatory Visit (HOSPITAL_COMMUNITY)
Admission: RE | Admit: 2018-10-07 | Discharge: 2018-10-07 | Disposition: A | Discharge status: Home / Self Care | Payer: Medicare HMO | Source: Ambulatory Visit | Attending: Hematology | Admitting: Hematology

## 2018-10-07 DIAGNOSIS — C50911 Malignant neoplasm of unspecified site of right female breast: Secondary | ICD-10-CM | POA: Diagnosis not present

## 2018-10-07 DIAGNOSIS — Z170 Estrogen receptor positive status [ER+]: Secondary | ICD-10-CM | POA: Insufficient documentation

## 2018-10-07 DIAGNOSIS — Z86718 Personal history of other venous thrombosis and embolism: Secondary | ICD-10-CM | POA: Diagnosis not present

## 2018-10-07 DIAGNOSIS — N189 Chronic kidney disease, unspecified: Secondary | ICD-10-CM | POA: Diagnosis not present

## 2018-10-07 DIAGNOSIS — M810 Age-related osteoporosis without current pathological fracture: Secondary | ICD-10-CM | POA: Insufficient documentation

## 2018-10-07 DIAGNOSIS — Z7901 Long term (current) use of anticoagulants: Secondary | ICD-10-CM | POA: Insufficient documentation

## 2018-10-07 DIAGNOSIS — Z7982 Long term (current) use of aspirin: Secondary | ICD-10-CM | POA: Insufficient documentation

## 2018-10-07 DIAGNOSIS — R921 Mammographic calcification found on diagnostic imaging of breast: Secondary | ICD-10-CM | POA: Diagnosis not present

## 2018-10-07 DIAGNOSIS — D631 Anemia in chronic kidney disease: Secondary | ICD-10-CM | POA: Diagnosis not present

## 2018-10-07 DIAGNOSIS — R5383 Other fatigue: Secondary | ICD-10-CM | POA: Diagnosis not present

## 2018-10-07 DIAGNOSIS — C50211 Malignant neoplasm of upper-inner quadrant of right female breast: Secondary | ICD-10-CM | POA: Diagnosis not present

## 2018-10-07 DIAGNOSIS — Z79899 Other long term (current) drug therapy: Secondary | ICD-10-CM | POA: Diagnosis not present

## 2018-10-07 DIAGNOSIS — I129 Hypertensive chronic kidney disease with stage 1 through stage 4 chronic kidney disease, or unspecified chronic kidney disease: Secondary | ICD-10-CM | POA: Diagnosis not present

## 2018-10-07 DIAGNOSIS — Z8249 Family history of ischemic heart disease and other diseases of the circulatory system: Secondary | ICD-10-CM | POA: Diagnosis not present

## 2018-10-07 LAB — COMPREHENSIVE METABOLIC PANEL
ALT: 12 U/L (ref 0–44)
AST: 18 U/L (ref 15–41)
Albumin: 4 g/dL (ref 3.5–5.0)
Alkaline Phosphatase: 59 U/L (ref 38–126)
Anion gap: 10 (ref 5–15)
BUN: 29 mg/dL — ABNORMAL HIGH (ref 8–23)
CO2: 25 mmol/L (ref 22–32)
Calcium: 9.1 mg/dL (ref 8.9–10.3)
Chloride: 106 mmol/L (ref 98–111)
Creatinine, Ser: 1.17 mg/dL — ABNORMAL HIGH (ref 0.44–1.00)
GFR calc Af Amer: 46 mL/min — ABNORMAL LOW (ref >60)
GFR calc non Af Amer: 40 mL/min — ABNORMAL LOW (ref >60)
Glucose, Bld: 88 mg/dL (ref 70–99)
Potassium: 4.1 mmol/L (ref 3.5–5.1)
Sodium: 141 mmol/L (ref 135–145)
Total Bilirubin: 0.6 mg/dL (ref 0.3–1.2)
Total Protein: 7.8 g/dL (ref 6.5–8.1)

## 2018-10-07 LAB — CBC WITH DIFFERENTIAL/PLATELET
Abs Immature Granulocytes: 0.01 10*3/uL (ref 0.00–0.07)
Basophils Absolute: 0 10*3/uL (ref 0.0–0.1)
Basophils Relative: 1 %
Eosinophils Absolute: 0.7 10*3/uL — ABNORMAL HIGH (ref 0.0–0.5)
Eosinophils Relative: 12 %
HCT: 34.1 % — ABNORMAL LOW (ref 36.0–46.0)
Hemoglobin: 10.4 g/dL — ABNORMAL LOW (ref 12.0–15.0)
Immature Granulocytes: 0 %
Lymphocytes Relative: 28 %
Lymphs Abs: 1.6 10*3/uL (ref 0.7–4.0)
MCH: 28.3 pg (ref 26.0–34.0)
MCHC: 30.5 g/dL (ref 30.0–36.0)
MCV: 92.9 fL (ref 80.0–100.0)
Monocytes Absolute: 0.6 10*3/uL (ref 0.1–1.0)
Monocytes Relative: 10 %
Neutro Abs: 2.9 10*3/uL (ref 1.7–7.7)
Neutrophils Relative %: 49 %
Platelets: 206 10*3/uL (ref 150–400)
RBC: 3.67 MIL/uL — ABNORMAL LOW (ref 3.87–5.11)
RDW: 15.7 % — ABNORMAL HIGH (ref 11.5–15.5)
WBC: 5.8 10*3/uL (ref 4.0–10.5)
nRBC: 0 % (ref 0.0–0.2)

## 2018-10-07 LAB — FERRITIN: Ferritin: 65 ng/mL (ref 11–307)

## 2018-10-07 LAB — LACTATE DEHYDROGENASE: LDH: 210 U/L — ABNORMAL HIGH (ref 98–192)

## 2018-10-09 ENCOUNTER — Inpatient Hospital Stay (HOSPITAL_BASED_OUTPATIENT_CLINIC_OR_DEPARTMENT_OTHER): Payer: Medicare HMO | Admitting: Hematology

## 2018-10-09 ENCOUNTER — Other Ambulatory Visit: Payer: Self-pay

## 2018-10-09 ENCOUNTER — Encounter (HOSPITAL_COMMUNITY): Payer: Self-pay | Admitting: Hematology

## 2018-10-09 DIAGNOSIS — M810 Age-related osteoporosis without current pathological fracture: Secondary | ICD-10-CM | POA: Diagnosis not present

## 2018-10-09 DIAGNOSIS — C50911 Malignant neoplasm of unspecified site of right female breast: Secondary | ICD-10-CM

## 2018-10-09 DIAGNOSIS — Z8249 Family history of ischemic heart disease and other diseases of the circulatory system: Secondary | ICD-10-CM

## 2018-10-09 DIAGNOSIS — I129 Hypertensive chronic kidney disease with stage 1 through stage 4 chronic kidney disease, or unspecified chronic kidney disease: Secondary | ICD-10-CM | POA: Diagnosis not present

## 2018-10-09 DIAGNOSIS — C50211 Malignant neoplasm of upper-inner quadrant of right female breast: Secondary | ICD-10-CM | POA: Diagnosis not present

## 2018-10-09 DIAGNOSIS — Z79899 Other long term (current) drug therapy: Secondary | ICD-10-CM

## 2018-10-09 DIAGNOSIS — Z170 Estrogen receptor positive status [ER+]: Secondary | ICD-10-CM | POA: Diagnosis not present

## 2018-10-09 DIAGNOSIS — Z7982 Long term (current) use of aspirin: Secondary | ICD-10-CM | POA: Diagnosis not present

## 2018-10-09 DIAGNOSIS — Z7901 Long term (current) use of anticoagulants: Secondary | ICD-10-CM

## 2018-10-09 DIAGNOSIS — N189 Chronic kidney disease, unspecified: Secondary | ICD-10-CM | POA: Diagnosis not present

## 2018-10-09 DIAGNOSIS — R5383 Other fatigue: Secondary | ICD-10-CM | POA: Diagnosis not present

## 2018-10-09 DIAGNOSIS — D631 Anemia in chronic kidney disease: Secondary | ICD-10-CM | POA: Diagnosis not present

## 2018-10-09 DIAGNOSIS — Z86718 Personal history of other venous thrombosis and embolism: Secondary | ICD-10-CM

## 2018-10-09 MED ORDER — ANASTROZOLE 1 MG PO TABS: 1.0000 mg | ORAL_TABLET | Freq: Every day | ORAL | 3 refills | Status: DC

## 2018-10-09 NOTE — Assessment & Plan Note (Signed)
1.  Right breast triple positive carcinoma: - Biopsy on 01/18/2016 of the medial right breast nodule, infiltrating ductal carcinoma, grade 3, Ki-67 of 5%, triple receptor positive. - Declined surgery and anti-HER-2 therapy.  Arimidex started in October 2017. - Mammogram dated 09/17/2017 shows stable mammographic appearance of biopsy-proven malignancy in the medial right breast. -Diagnostic bilateral mammogram was repeated on 10/07/2027 which demonstrated stable appearance of known biopsy-proven malignancy in the medial right breast.  No evidence of malignancy in the left breast. -Today's physical examination did not reveal any masses or palpable adenopathy.   -Patient states she is not currently on Arimidex.  I have resent patient a prescription to start Arimidex. - RTC in 3 mths.    2.  Osteoporosis: -DEXA scan on 09/17/2017 shows T score of -3.9. -We talked about initiating her on Prolia 60 mg every 6 months.  We talked about side effects including but not limited to hypocalcemia, flulike symptoms and rare chance of osteonecrosis of the jaw.  She is agreeable to this.   -Patient does not wish to start Prolia at this time.  She will continue vitamin D and calcium twice daily.  3.  Normocytic anemia: -This is from a combination of chronic kidney disease and relative iron deficiency.  -Hemoglobin is stable at 10.4, MCV 90.9, protein within normal at 65.  We will continue to monitor.  Recommend patient take iron tablet daily.

## 2018-10-09 NOTE — Progress Notes (Signed)
[address], [place] [address]   CLINIC:  Medical Oncology/Hematology  PCP:  Rosita Fire, MD [place] [id number] [phone]   REASON FOR VISIT:  Follow-up for Right breast triple positive carcinoma:  CURRENT THERAPY: Arimidex  BRIEF ONCOLOGIC HISTORY:  Oncology History  Hormone receptor positive breast cancer, right ([place])  01/04/2016 Mammogram   Screening mammogram, calcifications in R breast   01/12/2016 Mammogram   Diagnostic mammogram There are coarse and heterogeneous calcifications in the inferior medial right breast spanning 5.1 cm. There are some scattered similar-appearing calcifications behind the nipple at a posterior depth as well   01/18/2016 Mammogram   Mammographic images were obtained following stereotactic guided biopsy of right breast calcifications. The coil shaped marker is in good position.  IMPRESSION: Appropriate placement of biopsy marker   01/18/2016 Initial Biopsy   Stereotactic-guided biopsy of right breast calcifications. No apparent complications   7/40/8144 Pathology Results   Breast, right, needle core biopsy, medial SMALL FOCUS OF INVASIVE DUCTAL CARCINOMA, DUCTAL CARCINOMA IN SITU WITH CALCIFICATIONS AND NECROSIS, GRADE 3 Estrogen Receptor: 100%, POSITIVE, STRONG STAINING INTENSITY Progesterone Receptor: 60%, POSITIVE, STRONG STAINING INTENSITY Proliferation Marker Ki67: 5% HER2 - **POSITIVE** RATIO OF HER2/CEP17 SIGNALS 2.60 AVERAGE HER2 COPY NUMBER PER CELL 3.90   02/08/2016 -  Anti-estrogen oral therapy     06/05/2016 Breast US   Diagnostic mammogram tomo uni right & US breast ltd uni right inc axilla IMPRESSION: 1. Calcifications at site of biopsy proven malignancy in the inner right breast as well as additional smaller group of calcifications in the slightly outer right breast appear overall stable. 2. New mass in the upper inner right breast at the 2 o'clock  location possibly related to post biopsy change although papilloma or additional site of malignancy cannot be excluded.   09/18/2016 Mammogram   Diagnostic mammogram tomo uni and [place]: RECOMMENDATION: 1. Bilateral diagnostic mammogram and right breast ultrasound is recommended in September of 2018.  2. Clinical follow-up recommended for the painful area of concern in the right breast. Any further workup should be based on clinical grounds.  I have discussed the findings and recommendations with the patient. Results were also provided in writing at the conclusion of the visit. If applicable, a reminder letter will be sent to the patient regarding the next appointment.       CANCER STAGING: Cancer Staging Hormone receptor positive breast cancer, right ([place]) Staging form: Breast, AJCC 8th Edition - Clinical stage from 01/19/2016: cT3, cN0, cM0, G3, ER: Positive, PR: Positive, HER2: Positive - Signed by Baird Cancer, PA-C on 05/04/2016    INTERVAL HISTORY:  Ms. Vanostrand 85 y.o. female presents today for follow-up.  She reports overall doing well.  She denies any further changes to her breasts.  No new masses, lumps, nipple inversion, or discharge.  Denies any pains.  She states she is not taking Arimidex because she did not know she was supposed to.  Today repeat diagnostic mammogram yesterday and is here to discuss results.    REVIEW OF SYSTEMS:  Review of Systems  Constitutional: Positive for fatigue.  HENT:  Negative.   Eyes: Negative.   Respiratory: Negative.   Cardiovascular: Negative.   Gastrointestinal: Negative.   Endocrine: Negative.   Genitourinary: Negative.    Musculoskeletal: Negative.   Skin: Negative.   Neurological: Negative.   Hematological: Negative.   Psychiatric/Behavioral: Negative.      PAST MEDICAL/SURGICAL HISTORY:  Past Medical History:  Diagnosis Date  . Breast cancer ([place])   . DVT (deep venous thrombosis) ([place])    bilateral  .  Hormone receptor positive breast cancer, right ([place]) 02/12/2016  . Hypertension   . Paralysis [facility])    Past Surgical History:  Procedure Laterality Date  . APPENDECTOMY    . HERNIA REPAIR       SOCIAL HISTORY:  Social History   Socioeconomic History  . Marital status: Single    Spouse name: Not on file  . Number of children: Not on file  . Years of education: Not on file  . Highest education level: Not on file  Occupational History  . Not on file  Social Needs  . Financial resource strain: Not on file  . Food insecurity    Worry: Not on file    Inability: Not on file  . Transportation needs    Medical: Not on file    Non-medical: Not on file  Tobacco Use  . Smoking status: Never Smoker  . Smokeless tobacco: Never Used  Substance and Sexual Activity  . Alcohol use: No  . Drug use: No  . Sexual activity: Never  Lifestyle  . Physical activity    Days per week: Not on file    Minutes per session: Not on file  . Stress: Not on file  Relationships  . Social Herbalist on phone: Not on file    Gets together: Not on file    Attends religious service: Not on file    Active member of club or organization: Not on file    Attends meetings of clubs or organizations: Not on file    Relationship status: Not on file  . Intimate partner violence    Fear of current or ex partner: Not on file    Emotionally abused: Not on file    Physically abused: Not on file    Forced sexual activity: Not on file  Other Topics Concern  . Not on file  Social History Narrative  . Not on file    FAMILY HISTORY:  Family History  Problem Relation Age of Onset  . Heart failure Father     CURRENT MEDICATIONS:  Outpatient Encounter Medications as of 10/09/2018  Medication Sig  . amLODipine (NORVASC) 5 MG tablet Take 5 mg by mouth daily.   Marland Kitchen anastrozole (ARIMIDEX) 1 MG tablet Take 1 tablet (1 mg total) by mouth daily.  Marland Kitchen aspirin EC 81 MG tablet Take 81 mg by mouth at bedtime.   . baclofen (LIORESAL) 10 MG tablet Take 10 mg by mouth 2 (two) times daily as needed for muscle spasms (cramps).  Marland Kitchen lisinopril-hydrochlorothiazide (PRINZIDE,ZESTORETIC) 20-25 MG tablet Take 1 tablet by mouth daily.  Alveda Reasons 20 MG TABS tablet    No facility-administered encounter medications on file as of 10/09/2018.     ALLERGIES:  No Known Allergies   PHYSICAL EXAM:  ECOG Performance status: 2  Vitals:   10/09/18 1200  BP: (!) 158/72  Pulse: 83  Resp: 16  Temp: 99.6 F (37.6 C)  SpO2: 97%   Filed Weights    Physical Exam Constitutional:      Appearance: Normal appearance.  HENT:     Head: Normocephalic.     Nose: Nose normal.     Mouth/Throat:     Mouth: Mucous membranes are moist.     Pharynx: Oropharynx is clear.  Eyes:     Extraocular Movements: Extraocular movements intact.  Conjunctiva/sclera: Conjunctivae normal.  Cardiovascular:     Rate and Rhythm: Normal rate and regular rhythm.     Heart sounds: Normal heart sounds.  Pulmonary:     Effort: Pulmonary effort is normal.     Breath sounds: Normal breath sounds.  Abdominal:     General: Bowel sounds are normal.     Palpations: Abdomen is soft.  Musculoskeletal: Normal range of motion.  Skin:    General: Skin is warm and dry.  Neurological:     General: No focal deficit present.     Mental Status: She is alert and oriented to person, place, and time.  Psychiatric:        Mood and Affect: Mood normal.        Behavior: Behavior normal.        Thought Content: Thought content normal.        Judgment: Judgment normal.      LABORATORY DATA:  I have reviewed the labs as listed.  CBC    Component Value Date/Time   WBC 5.8 10/07/2018 1057   RBC 3.67 (L) 10/07/2018 1057   HGB 10.4 (L) 10/07/2018 1057   HCT 34.1 (L) 10/07/2018 1057   PLT 206 10/07/2018 1057   MCV 92.9 10/07/2018 1057   MCH 28.3 10/07/2018 1057   MCHC 30.5 10/07/2018 1057   RDW 15.7 (H) 10/07/2018 1057   LYMPHSABS 1.6  10/07/2018 1057   MONOABS 0.6 10/07/2018 1057   EOSABS 0.7 (H) 10/07/2018 1057   BASOSABS 0.0 10/07/2018 1057   CMP Latest Ref Rng & Units 10/07/2018 07/17/2018 01/16/2018  Glucose 70 - 99 mg/dL 88 78 85  BUN 8 - 23 mg/dL 29(H) 41(H) 41(H)  Creatinine 0.44 - 1.00 mg/dL 1.17(H) 1.26(H) 1.63(H)  Sodium 135 - 145 mmol/L 141 141 143  Potassium 3.5 - 5.1 mmol/L 4.1 4.3 3.9  Chloride 98 - 111 mmol/L 106 108 108  CO2 22 - 32 mmol/L _0 Calcium 8.9 - 10.3 mg/dL 9.1 9.3 9.2  Total Protein 6.5 - 8.1 g/dL 7.8 7.5 7.2  Total Bilirubin 0.3 - 1.2 mg/dL 0.6 0.4 0.4  Alkaline Phos 38 - 126 U/L 59 56 68  AST 15 - 41 U/L _1 ALT 0 - 44 U/L _2 ASSESSMENT & PLAN:   Hormone receptor positive breast cancer, right ([facility]) 1.  Right breast triple positive carcinoma: - Biopsy on 01/18/2016 of the medial right breast nodule, infiltrating ductal carcinoma, grade 3, Ki-67 of 5%, triple receptor positive. - Declined surgery and anti-HER-2 therapy.  Arimidex started in October 2017. - Mammogram dated 09/17/2017 shows stable mammographic appearance of biopsy-proven malignancy in the medial right breast. -Diagnostic bilateral mammogram was repeated on 10/07/2027 which demonstrated stable appearance of known biopsy-proven malignancy in the medial right breast.  No evidence of malignancy in the left breast. -Today's physical examination did not reveal any masses or palpable adenopathy.   -Patient states she is not currently on Arimidex.  I have resent patient a prescription to start Arimidex. - RTC in 3 mths.    2.  Osteoporosis: -DEXA scan on 09/17/2017 shows T score of -3.9. -We talked about initiating her on Prolia 60 mg every 6 months.  We talked about side effects including but not limited to hypocalcemia, flulike symptoms and rare chance of osteonecrosis of the jaw.  She is agreeable to this.   -Patient does not wish to start Prolia at this time.  She will continue  vitamin D and calcium twice  daily.  3.  Normocytic anemia: -This is from a combination of chronic kidney disease and relative iron deficiency.  -Hemoglobin is stable at 10.4, MCV 90.9, protein within normal at 65.  We will continue to monitor.  Recommend patient take iron tablet daily.      Orders placed this encounter:  Orders Placed This Encounter  Procedures  . CBC with Differential  . Comprehensive metabolic panel  . Iron and TIBC  . [place], [place] [phone]

## 2018-10-15 DIAGNOSIS — G8190 Hemiplegia, unspecified affecting unspecified side: Secondary | ICD-10-CM | POA: Diagnosis not present

## 2018-11-05 ENCOUNTER — Emergency Department (HOSPITAL_COMMUNITY): Payer: Medicare HMO | Admitting: Anesthesiology

## 2018-11-05 ENCOUNTER — Emergency Department (HOSPITAL_COMMUNITY): Payer: Medicare HMO

## 2018-11-05 ENCOUNTER — Encounter (HOSPITAL_COMMUNITY)
Admission: EM | Disposition: A | Discharge status: Hospice | Payer: Self-pay | Source: Home / Self Care | Attending: Neurology

## 2018-11-05 ENCOUNTER — Encounter (HOSPITAL_COMMUNITY): Payer: Self-pay

## 2018-11-05 ENCOUNTER — Inpatient Hospital Stay (HOSPITAL_COMMUNITY)
Admission: EM | Admit: 2018-11-05 | Discharge: 2018-11-11 | DRG: 023 | Disposition: A | Discharge status: Hospice | Payer: Medicare HMO | Attending: Neurology | Admitting: Neurology

## 2018-11-05 ENCOUNTER — Inpatient Hospital Stay (HOSPITAL_COMMUNITY): Payer: Medicare HMO

## 2018-11-05 DIAGNOSIS — I672 Cerebral atherosclerosis: Secondary | ICD-10-CM | POA: Diagnosis present

## 2018-11-05 DIAGNOSIS — J9690 Respiratory failure, unspecified, unspecified whether with hypoxia or hypercapnia: Secondary | ICD-10-CM | POA: Diagnosis not present

## 2018-11-05 DIAGNOSIS — R1310 Dysphagia, unspecified: Secondary | ICD-10-CM | POA: Diagnosis present

## 2018-11-05 DIAGNOSIS — D62 Acute posthemorrhagic anemia: Secondary | ICD-10-CM | POA: Diagnosis not present

## 2018-11-05 DIAGNOSIS — I361 Nonrheumatic tricuspid (valve) insufficiency: Secondary | ICD-10-CM | POA: Diagnosis not present

## 2018-11-05 DIAGNOSIS — I10 Essential (primary) hypertension: Secondary | ICD-10-CM | POA: Diagnosis not present

## 2018-11-05 DIAGNOSIS — J9601 Acute respiratory failure with hypoxia: Secondary | ICD-10-CM | POA: Diagnosis not present

## 2018-11-05 DIAGNOSIS — I129 Hypertensive chronic kidney disease with stage 1 through stage 4 chronic kidney disease, or unspecified chronic kidney disease: Secondary | ICD-10-CM | POA: Diagnosis present

## 2018-11-05 DIAGNOSIS — R4701 Aphasia: Secondary | ICD-10-CM | POA: Diagnosis present

## 2018-11-05 DIAGNOSIS — Z8249 Family history of ischemic heart disease and other diseases of the circulatory system: Secondary | ICD-10-CM

## 2018-11-05 DIAGNOSIS — I63312 Cerebral infarction due to thrombosis of left middle cerebral artery: Secondary | ICD-10-CM | POA: Diagnosis not present

## 2018-11-05 DIAGNOSIS — E785 Hyperlipidemia, unspecified: Secondary | ICD-10-CM | POA: Diagnosis present

## 2018-11-05 DIAGNOSIS — I63412 Cerebral infarction due to embolism of left middle cerebral artery: Secondary | ICD-10-CM | POA: Diagnosis not present

## 2018-11-05 DIAGNOSIS — R54 Age-related physical debility: Secondary | ICD-10-CM | POA: Diagnosis present

## 2018-11-05 DIAGNOSIS — I495 Sick sinus syndrome: Secondary | ICD-10-CM | POA: Diagnosis present

## 2018-11-05 DIAGNOSIS — R4020 Unspecified coma: Secondary | ICD-10-CM | POA: Diagnosis not present

## 2018-11-05 DIAGNOSIS — Z66 Do not resuscitate: Secondary | ICD-10-CM | POA: Diagnosis not present

## 2018-11-05 DIAGNOSIS — I69354 Hemiplegia and hemiparesis following cerebral infarction affecting left non-dominant side: Secondary | ICD-10-CM

## 2018-11-05 DIAGNOSIS — M810 Age-related osteoporosis without current pathological fracture: Secondary | ICD-10-CM | POA: Diagnosis present

## 2018-11-05 DIAGNOSIS — I6602 Occlusion and stenosis of left middle cerebral artery: Secondary | ICD-10-CM | POA: Diagnosis not present

## 2018-11-05 DIAGNOSIS — R29810 Facial weakness: Secondary | ICD-10-CM | POA: Diagnosis present

## 2018-11-05 DIAGNOSIS — I63512 Cerebral infarction due to unspecified occlusion or stenosis of left middle cerebral artery: Secondary | ICD-10-CM | POA: Diagnosis not present

## 2018-11-05 DIAGNOSIS — Z20828 Contact with and (suspected) exposure to other viral communicable diseases: Secondary | ICD-10-CM | POA: Diagnosis not present

## 2018-11-05 DIAGNOSIS — R1312 Dysphagia, oropharyngeal phase: Secondary | ICD-10-CM | POA: Diagnosis not present

## 2018-11-05 DIAGNOSIS — I6521 Occlusion and stenosis of right carotid artery: Secondary | ICD-10-CM | POA: Diagnosis not present

## 2018-11-05 DIAGNOSIS — Z4659 Encounter for fitting and adjustment of other gastrointestinal appliance and device: Secondary | ICD-10-CM

## 2018-11-05 DIAGNOSIS — Z0189 Encounter for other specified special examinations: Secondary | ICD-10-CM

## 2018-11-05 DIAGNOSIS — R29728 NIHSS score 28: Secondary | ICD-10-CM | POA: Diagnosis present

## 2018-11-05 DIAGNOSIS — R410 Disorientation, unspecified: Secondary | ICD-10-CM | POA: Diagnosis not present

## 2018-11-05 DIAGNOSIS — I639 Cerebral infarction, unspecified: Secondary | ICD-10-CM

## 2018-11-05 DIAGNOSIS — Z8679 Personal history of other diseases of the circulatory system: Secondary | ICD-10-CM

## 2018-11-05 DIAGNOSIS — I6300 Cerebral infarction due to thrombosis of unspecified precerebral artery: Secondary | ICD-10-CM

## 2018-11-05 DIAGNOSIS — Z79811 Long term (current) use of aromatase inhibitors: Secondary | ICD-10-CM

## 2018-11-05 DIAGNOSIS — Z86718 Personal history of other venous thrombosis and embolism: Secondary | ICD-10-CM | POA: Diagnosis not present

## 2018-11-05 DIAGNOSIS — C50911 Malignant neoplasm of unspecified site of right female breast: Secondary | ICD-10-CM | POA: Diagnosis present

## 2018-11-05 DIAGNOSIS — N179 Acute kidney failure, unspecified: Secondary | ICD-10-CM | POA: Diagnosis not present

## 2018-11-05 DIAGNOSIS — R29818 Other symptoms and signs involving the nervous system: Secondary | ICD-10-CM | POA: Diagnosis not present

## 2018-11-05 DIAGNOSIS — E441 Mild protein-calorie malnutrition: Secondary | ICD-10-CM | POA: Diagnosis present

## 2018-11-05 DIAGNOSIS — H518 Other specified disorders of binocular movement: Secondary | ICD-10-CM | POA: Diagnosis present

## 2018-11-05 DIAGNOSIS — Z515 Encounter for palliative care: Secondary | ICD-10-CM | POA: Diagnosis not present

## 2018-11-05 DIAGNOSIS — Q211 Atrial septal defect: Secondary | ICD-10-CM | POA: Diagnosis not present

## 2018-11-05 DIAGNOSIS — G8191 Hemiplegia, unspecified affecting right dominant side: Secondary | ICD-10-CM | POA: Diagnosis present

## 2018-11-05 DIAGNOSIS — I82409 Acute embolism and thrombosis of unspecified deep veins of unspecified lower extremity: Secondary | ICD-10-CM | POA: Diagnosis present

## 2018-11-05 DIAGNOSIS — Z9889 Other specified postprocedural states: Secondary | ICD-10-CM | POA: Diagnosis not present

## 2018-11-05 DIAGNOSIS — N183 Chronic kidney disease, stage 3 (moderate): Secondary | ICD-10-CM | POA: Diagnosis present

## 2018-11-05 DIAGNOSIS — Z79899 Other long term (current) drug therapy: Secondary | ICD-10-CM

## 2018-11-05 DIAGNOSIS — D696 Thrombocytopenia, unspecified: Secondary | ICD-10-CM | POA: Diagnosis not present

## 2018-11-05 DIAGNOSIS — I69391 Dysphagia following cerebral infarction: Secondary | ICD-10-CM

## 2018-11-05 DIAGNOSIS — Z7982 Long term (current) use of aspirin: Secondary | ICD-10-CM

## 2018-11-05 DIAGNOSIS — J9600 Acute respiratory failure, unspecified whether with hypoxia or hypercapnia: Secondary | ICD-10-CM | POA: Diagnosis not present

## 2018-11-05 DIAGNOSIS — G319 Degenerative disease of nervous system, unspecified: Secondary | ICD-10-CM | POA: Diagnosis not present

## 2018-11-05 DIAGNOSIS — Q2112 Patent foramen ovale: Secondary | ICD-10-CM

## 2018-11-05 DIAGNOSIS — Z7901 Long term (current) use of anticoagulants: Secondary | ICD-10-CM

## 2018-11-05 DIAGNOSIS — R404 Transient alteration of awareness: Secondary | ICD-10-CM | POA: Diagnosis not present

## 2018-11-05 DIAGNOSIS — Z4682 Encounter for fitting and adjustment of non-vascular catheter: Secondary | ICD-10-CM | POA: Diagnosis not present

## 2018-11-05 DIAGNOSIS — Z452 Encounter for adjustment and management of vascular access device: Secondary | ICD-10-CM | POA: Diagnosis not present

## 2018-11-05 DIAGNOSIS — I341 Nonrheumatic mitral (valve) prolapse: Secondary | ICD-10-CM | POA: Diagnosis not present

## 2018-11-05 HISTORY — PX: RADIOLOGY WITH ANESTHESIA: SHX6223

## 2018-11-05 HISTORY — PX: IR CT HEAD LTD: IMG2386

## 2018-11-05 HISTORY — PX: IR PERCUTANEOUS ART THROMBECTOMY/INFUSION INTRACRANIAL INC DIAG ANGIO: IMG6087

## 2018-11-05 LAB — DIFFERENTIAL
Abs Immature Granulocytes: 0.01 10*3/uL (ref 0.00–0.07)
Basophils Absolute: 0 10*3/uL (ref 0.0–0.1)
Basophils Relative: 1 %
Eosinophils Absolute: 1.4 10*3/uL — ABNORMAL HIGH (ref 0.0–0.5)
Eosinophils Relative: 28 %
Immature Granulocytes: 0 %
Lymphocytes Relative: 24 %
Lymphs Abs: 1.2 10*3/uL (ref 0.7–4.0)
Monocytes Absolute: 0.4 10*3/uL (ref 0.1–1.0)
Monocytes Relative: 9 %
Neutro Abs: 1.9 10*3/uL (ref 1.7–7.7)
Neutrophils Relative %: 38 %

## 2018-11-05 LAB — URINALYSIS, ROUTINE W REFLEX MICROSCOPIC
Bilirubin Urine: NEGATIVE
Glucose, UA: NEGATIVE mg/dL
Hgb urine dipstick: NEGATIVE
Ketones, ur: NEGATIVE mg/dL
Leukocytes,Ua: NEGATIVE
Nitrite: NEGATIVE
Protein, ur: NEGATIVE mg/dL
Specific Gravity, Urine: 1.036 — ABNORMAL HIGH (ref 1.005–1.030)
pH: 7 (ref 5.0–8.0)

## 2018-11-05 LAB — RAPID URINE DRUG SCREEN, HOSP PERFORMED
Amphetamines: NOT DETECTED
Barbiturates: NOT DETECTED
Benzodiazepines: NOT DETECTED
Cocaine: NOT DETECTED
Opiates: NOT DETECTED
Tetrahydrocannabinol: NOT DETECTED

## 2018-11-05 LAB — CBC
HCT: 31.6 % — ABNORMAL LOW (ref 36.0–46.0)
Hemoglobin: 9.6 g/dL — ABNORMAL LOW (ref 12.0–15.0)
MCH: 27.7 pg (ref 26.0–34.0)
MCHC: 30.4 g/dL (ref 30.0–36.0)
MCV: 91.3 fL (ref 80.0–100.0)
Platelets: 186 10*3/uL (ref 150–400)
RBC: 3.46 MIL/uL — ABNORMAL LOW (ref 3.87–5.11)
RDW: 15.9 % — ABNORMAL HIGH (ref 11.5–15.5)
WBC: 5 10*3/uL (ref 4.0–10.5)
nRBC: 0 % (ref 0.0–0.2)

## 2018-11-05 LAB — CBG MONITORING, ED: Glucose-Capillary: 86 mg/dL (ref 70–99)

## 2018-11-05 LAB — POCT I-STAT 7, (LYTES, BLD GAS, ICA,H+H)
Acid-Base Excess: 3 mmol/L — ABNORMAL HIGH (ref 0.0–2.0)
Bicarbonate: 26.5 mmol/L (ref 20.0–28.0)
Calcium, Ion: 1.19 mmol/L (ref 1.15–1.40)
HCT: 26 % — ABNORMAL LOW (ref 36.0–46.0)
Hemoglobin: 8.8 g/dL — ABNORMAL LOW (ref 12.0–15.0)
O2 Saturation: 100 %
Potassium: 3.6 mmol/L (ref 3.5–5.1)
Sodium: 142 mmol/L (ref 135–145)
TCO2: 27 mmol/L (ref 22–32)
pCO2 arterial: 33.2 mmHg (ref 32.0–48.0)
pH, Arterial: 7.51 — ABNORMAL HIGH (ref 7.350–7.450)
pO2, Arterial: 469 mmHg — ABNORMAL HIGH (ref 83.0–108.0)

## 2018-11-05 LAB — I-STAT CHEM 8, ED
BUN: 52 mg/dL — ABNORMAL HIGH (ref 8–23)
Calcium, Ion: 1.16 mmol/L (ref 1.15–1.40)
Chloride: 105 mmol/L (ref 98–111)
Creatinine, Ser: 1.2 mg/dL — ABNORMAL HIGH (ref 0.44–1.00)
Glucose, Bld: 86 mg/dL (ref 70–99)
HCT: 34 % — ABNORMAL LOW (ref 36.0–46.0)
Hemoglobin: 11.6 g/dL — ABNORMAL LOW (ref 12.0–15.0)
Potassium: 5 mmol/L (ref 3.5–5.1)
Sodium: 140 mmol/L (ref 135–145)
TCO2: 30 mmol/L (ref 22–32)

## 2018-11-05 LAB — COMPREHENSIVE METABOLIC PANEL
ALT: 13 U/L (ref 0–44)
AST: 21 U/L (ref 15–41)
Albumin: 3.5 g/dL (ref 3.5–5.0)
Alkaline Phosphatase: 65 U/L (ref 38–126)
Anion gap: 8 (ref 5–15)
BUN: 40 mg/dL — ABNORMAL HIGH (ref 8–23)
CO2: 26 mmol/L (ref 22–32)
Calcium: 9.3 mg/dL (ref 8.9–10.3)
Chloride: 107 mmol/L (ref 98–111)
Creatinine, Ser: 1.32 mg/dL — ABNORMAL HIGH (ref 0.44–1.00)
GFR calc Af Amer: 40 mL/min — ABNORMAL LOW (ref >60)
GFR calc non Af Amer: 34 mL/min — ABNORMAL LOW (ref >60)
Glucose, Bld: 92 mg/dL (ref 70–99)
Potassium: 4 mmol/L (ref 3.5–5.1)
Sodium: 141 mmol/L (ref 135–145)
Total Bilirubin: 0.3 mg/dL (ref 0.3–1.2)
Total Protein: 7.4 g/dL (ref 6.5–8.1)

## 2018-11-05 LAB — ETHANOL: Alcohol, Ethyl (B): <10 mg/dL (ref <10)

## 2018-11-05 LAB — PROTIME-INR
INR: 1 (ref 0.8–1.2)
Prothrombin Time: 13.1 seconds (ref 11.4–15.2)

## 2018-11-05 LAB — TRIGLYCERIDES: Triglycerides: 79 mg/dL (ref <150)

## 2018-11-05 LAB — SARS CORONAVIRUS 2 BY RT PCR (HOSPITAL ORDER, PERFORMED IN ~~LOC~~ HOSPITAL LAB): SARS Coronavirus 2: NEGATIVE

## 2018-11-05 LAB — MRSA PCR SCREENING: MRSA by PCR: NEGATIVE

## 2018-11-05 LAB — APTT: aPTT: 31 seconds (ref 24–36)

## 2018-11-05 SURGERY — IR WITH ANESTHESIA
Anesthesia: General

## 2018-11-05 MED ORDER — SODIUM CHLORIDE (PF) 0.9 % IJ SOLN
INTRAVENOUS | Status: AC | PRN
  Administered 2018-11-05: 25 ug via INTRA_ARTERIAL

## 2018-11-05 MED ORDER — ACETAMINOPHEN 325 MG PO TABS: 650.0000 mg | ORAL_TABLET | ORAL | Status: DC | PRN

## 2018-11-05 MED ORDER — IOHEXOL 300 MG/ML  SOLN
150.0000 mL | Freq: Once | INTRAMUSCULAR | Status: AC | PRN
  Administered 2018-11-05: 150 mL via INTRA_ARTERIAL

## 2018-11-05 MED ORDER — CHLORHEXIDINE GLUCONATE 0.12% ORAL RINSE (MEDLINE KIT)
15.0000 mL | Freq: Two times a day (BID) | OROMUCOSAL | Status: DC
  Administered 2018-11-05 – 2018-11-09 (×8): 15 mL via OROMUCOSAL

## 2018-11-05 MED ORDER — CLEVIDIPINE BUTYRATE 0.5 MG/ML IV EMUL
0.0000 mg/h | INTRAVENOUS | Status: AC
  Administered 2018-11-05: 4 mg/h via INTRAVENOUS
  Administered 2018-11-05 – 2018-11-06 (×2): 10 mg/h via INTRAVENOUS
  Filled 2018-11-05 (×4): qty 50

## 2018-11-05 MED ORDER — FENTANYL CITRATE (PF) 100 MCG/2ML IJ SOLN: 25.0000 ug | Freq: Once | INTRAMUSCULAR | Status: DC

## 2018-11-05 MED ORDER — FENTANYL CITRATE (PF) 100 MCG/2ML IJ SOLN
25.0000 ug | INTRAMUSCULAR | Status: DC | PRN
  Administered 2018-11-09: 100 ug via INTRAVENOUS
  Filled 2018-11-05: qty 2

## 2018-11-05 MED ORDER — ACETAMINOPHEN 160 MG/5ML PO SOLN: 650.0000 mg | ORAL | Status: DC | PRN

## 2018-11-05 MED ORDER — PROPOFOL 10 MG/ML IV BOLUS
INTRAVENOUS | Status: DC | PRN
  Administered 2018-11-05: 20 mg via INTRAVENOUS
  Administered 2018-11-05: 30 mg via INTRAVENOUS
  Administered 2018-11-05: 10 mg via INTRAVENOUS
  Administered 2018-11-05: 100 mg via INTRAVENOUS
  Administered 2018-11-05 (×2): 20 mg via INTRAVENOUS
  Administered 2018-11-05: 50 mg via INTRAVENOUS

## 2018-11-05 MED ORDER — SENNOSIDES 8.8 MG/5ML PO SYRP: 5.0000 mL | ORAL_SOLUTION | Freq: Two times a day (BID) | ORAL | Status: DC | PRN

## 2018-11-05 MED ORDER — MIDAZOLAM HCL 2 MG/2ML IJ SOLN: 1.0000 mg | INTRAMUSCULAR | Status: DC | PRN

## 2018-11-05 MED ORDER — SODIUM CHLORIDE 0.9 % IV SOLN
INTRAVENOUS | Status: DC
  Administered 2018-11-05: 15:00:00 via INTRAVENOUS

## 2018-11-05 MED ORDER — ROCURONIUM BROMIDE 10 MG/ML (PF) SYRINGE
PREFILLED_SYRINGE | INTRAVENOUS | Status: DC | PRN
  Administered 2018-11-05: 20 mg via INTRAVENOUS
  Administered 2018-11-05: 30 mg via INTRAVENOUS
  Administered 2018-11-05: 80 mg via INTRAVENOUS

## 2018-11-05 MED ORDER — PROPOFOL 500 MG/50ML IV EMUL
INTRAVENOUS | Status: DC | PRN
  Administered 2018-11-05: 50 ug/kg/min via INTRAVENOUS

## 2018-11-05 MED ORDER — ACETAMINOPHEN 650 MG RE SUPP: 650.0000 mg | RECTAL | Status: DC | PRN

## 2018-11-05 MED ORDER — FENTANYL 2500MCG IN NS 250ML (10MCG/ML) PREMIX INFUSION: 25.0000 ug/h | INTRAVENOUS | Status: DC

## 2018-11-05 MED ORDER — LIDOCAINE 2% (20 MG/ML) 5 ML SYRINGE
INTRAMUSCULAR | Status: DC | PRN
  Administered 2018-11-05: 50 mg via INTRAVENOUS

## 2018-11-05 MED ORDER — CEFAZOLIN SODIUM-DEXTROSE 2-3 GM-%(50ML) IV SOLR
INTRAVENOUS | Status: DC | PRN
  Administered 2018-11-05: 2 g via INTRAVENOUS

## 2018-11-05 MED ORDER — CHLORHEXIDINE GLUCONATE CLOTH 2 % EX PADS
6.0000 | MEDICATED_PAD | Freq: Every day | CUTANEOUS | Status: DC
  Administered 2018-11-05 – 2018-11-06 (×2): 6 via TOPICAL

## 2018-11-05 MED ORDER — ASPIRIN 300 MG RE SUPP
300.0000 mg | Freq: Every day | RECTAL | Status: DC
  Administered 2018-11-05 – 2018-11-08 (×4): 300 mg via RECTAL
  Filled 2018-11-05 (×4): qty 1

## 2018-11-05 MED ORDER — IOHEXOL 350 MG/ML SOLN
100.0000 mL | Freq: Once | INTRAVENOUS | Status: AC | PRN
  Administered 2018-11-05: 100 mL via INTRAVENOUS

## 2018-11-05 MED ORDER — PHENYLEPHRINE 40 MCG/ML (10ML) SYRINGE FOR IV PUSH (FOR BLOOD PRESSURE SUPPORT)
PREFILLED_SYRINGE | INTRAVENOUS | Status: DC | PRN
  Administered 2018-11-05: 80 ug via INTRAVENOUS

## 2018-11-05 MED ORDER — PROPOFOL 1000 MG/100ML IV EMUL: 5.0000 ug/kg/min | INTRAVENOUS | Status: DC

## 2018-11-05 MED ORDER — ORAL CARE MOUTH RINSE
15.0000 mL | OROMUCOSAL | Status: DC
  Administered 2018-11-05 – 2018-11-09 (×36): 15 mL via OROMUCOSAL

## 2018-11-05 MED ORDER — LACTATED RINGERS IV SOLN
INTRAVENOUS | Status: DC | PRN
  Administered 2018-11-05: 14:00:00 via INTRAVENOUS

## 2018-11-05 MED ORDER — FENTANYL BOLUS VIA INFUSION
25.0000 ug | INTRAVENOUS | Status: DC | PRN
  Filled 2018-11-05: qty 25

## 2018-11-05 MED ORDER — ACETAMINOPHEN 650 MG RE SUPP
650.0000 mg | RECTAL | Status: DC | PRN
  Administered 2018-11-06 – 2018-11-09 (×3): 650 mg via RECTAL
  Filled 2018-11-05 (×5): qty 1

## 2018-11-05 MED ORDER — BISACODYL 10 MG RE SUPP: 10.0000 mg | Freq: Every day | RECTAL | Status: DC | PRN

## 2018-11-05 MED ORDER — ASPIRIN 325 MG PO TABS
325.0000 mg | ORAL_TABLET | Freq: Every day | ORAL | Status: DC
  Filled 2018-11-05: qty 1

## 2018-11-05 MED ORDER — GLYCOPYRROLATE PF 0.2 MG/ML IJ SOSY
PREFILLED_SYRINGE | INTRAMUSCULAR | Status: DC | PRN
  Administered 2018-11-05: .2 mg via INTRAVENOUS

## 2018-11-05 MED ORDER — SODIUM CHLORIDE 0.9 % IV SOLN
INTRAVENOUS | Status: DC
  Administered 2018-11-05 – 2018-11-09 (×5): via INTRAVENOUS

## 2018-11-05 MED ORDER — ESMOLOL HCL 100 MG/10ML IV SOLN
INTRAVENOUS | Status: DC | PRN
  Administered 2018-11-05: 30 mg via INTRAVENOUS
  Administered 2018-11-05: 10 mg via INTRAVENOUS
  Administered 2018-11-05: 40 mg via INTRAVENOUS
  Administered 2018-11-05: 20 mg via INTRAVENOUS

## 2018-11-05 MED ORDER — STROKE: EARLY STAGES OF RECOVERY BOOK
Freq: Once | Status: AC
  Administered 2018-11-05: 19:00:00
  Filled 2018-11-05: qty 1

## 2018-11-05 MED ORDER — SODIUM CHLORIDE 0.9 % IV SOLN
INTRAVENOUS | Status: DC | PRN
  Administered 2018-11-05: 10 ug/min via INTRAVENOUS

## 2018-11-05 MED ORDER — PANTOPRAZOLE SODIUM 40 MG IV SOLR
40.0000 mg | INTRAVENOUS | Status: DC
  Administered 2018-11-05 – 2018-11-07 (×3): 40 mg via INTRAVENOUS
  Filled 2018-11-05 (×3): qty 40

## 2018-11-05 MED ORDER — ACETAMINOPHEN 160 MG/5ML PO SOLN
650.0000 mg | ORAL | Status: DC | PRN
  Filled 2018-11-05: qty 20.3

## 2018-11-05 NOTE — Code Documentation (Signed)
85 yo female coming from [facility] with complaints of sudden onset of right sided weakness and left gaze that started at 1200. Pt was at her home with sister when the symptoms started. EMS called and patient taken to [facility]. Pt was not a tPA candidate due to being on Eliquis, but was noted to have M1 occlusion on CTA. mRS 2 and lives independently. IR activated and patient transported with [facility]. Arrived to ED at 1407. Initial NIHSS 22 due to inability to follow commands, speak, right sided weakness, left gaze, and neglect. Intubated in the ED by Anesthesia and taken directly to the IR suite. Arrived to IR at 1427. Handoff given to [place], Therapist, sports. See Stroke Timeline and NIHSS flowsheet for details.

## 2018-11-05 NOTE — H&P (Signed)
Neurology H&P  CC: Aphasia  History is obtained from: Sister  HPI: Angela Higgins is a 85 y.o. female with a history of DVT on Eliquis due to this who was in her normal state of health until she was eating lunch around noon developed sudden right-sided weakness.  She reportedly sneezed and subsequently became "unresponsive."  She was taken to [facility] where she was seen to have right-sided weakness and aphasia and tele-neurology evaluated the patient and a CTA was performed which demonstrated a left M1 occlusion with significant penumbra by CT perfusion.  She does have a history of breast cancer, but read the notes it sounds like she is been fairly stable with most recent imaging on June 9 of this year.  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  LKW: Noon tpa given?: No, anticoagulation IR Thrombectomy?  Yes Modified Rankin Scale: 2-Slight disability-UNABLE to perform all activities but does not need assistance    ROS: Unable to obtain due to altered mental status.   Past Medical History:  Diagnosis Date  . Breast cancer ([place])   . DVT (deep venous thrombosis) ([place])    bilateral  . Hormone receptor positive breast cancer, right ([place]) 02/12/2016  . Hypertension   . Paralysis ([place])      Family History  Problem Relation Age of Onset  . Heart failure Father      Social History:  reports that she has never smoked. She has never used smokeless tobacco. She reports that she does not drink alcohol or use drugs.   Prior to Admission medications   Medication Sig Start Date End Date Taking? Authorizing Provider  amLODipine (NORVASC) 5 MG tablet Take 5 mg by mouth daily.  01/05/16   [provider]  anastrozole (ARIMIDEX) 1 MG tablet Take 1 tablet (1 mg total) by mouth daily. 07/18/17   Holley Bouche, NP  anastrozole (ARIMIDEX) 1 MG tablet Take 1 tablet (1 mg total) by mouth daily. 10/09/18   Roger Shelter,  FNP  aspirin EC 81 MG tablet Take 81 mg by mouth at bedtime.    [provider]  baclofen (LIORESAL) 10 MG tablet Take 10 mg by mouth 2 (two) times daily as needed for muscle spasms (cramps).    [provider]  lisinopril-hydrochlorothiazide (PRINZIDE,ZESTORETIC) 20-25 MG tablet Take 1 tablet by mouth daily. 11/02/16   [provider]  XARELTO 20 MG TABS tablet  01/08/18   [provider]     Exam: Current vital signs: BP (!) 161/80   Pulse 88   Temp 97.8 F (36.6 C) (Oral)   Resp 16   SpO2 96%    Physical Exam  Constitutional: Appears well-developed and well-nourished.  Psych: Affect appropriate to situation Eyes: No scleral injection HENT: No OP obstrucion Head: Normocephalic.  Cardiovascular: Normal rate and regular rhythm.  Respiratory: Effort normal and breath sounds normal to anterior ascultation GI: Soft.  No distension. There is no tenderness.  Skin: WDI  Neuro: Mental Status: Patient is awake, she is densely globally aphasic Cranial Nerves: II: Does not blink to threat from the right pupils are equal, round, and reactive to light.   III,IV, VI: She is a market left gaze preference V: VII: She has a left facial droop Motor: She has a severe right hemiparesis with 2/5 strength in the right upper extremity and lower extremity, she is able to hold her  left arm against gravity, she has a spastic left hand  sensory: Sensation is diminished on the right, but she does respond to noxious stimulation in all 4 extremities Cerebellar: Does not perform  I have reviewed labs in epic and the pertinent results are: Creatinine 1.32 COVID  Negative  I have reviewed the images obtained: CT A/P- left M1 occlusion with large area of penumbra  Primary Diagnosis:  Cerebral infarction due to occlusion or stenosis of left middle cerebral artery.   Secondary Diagnosis: CKD Stage 2 (GFR 60-89)   Impression: 85 year old female who is remarkably  independent for her age who presents with acute left MCA syndrome due to occluded left MCA.  She was not a candidate for intravenous lytics due to anticoagulation with Eliquis, and I discussed treatment options with family and we did decide to proceed with mechanical thrombectomy.  Plan: - HgbA1c, fasting lipid panel - MRI of the brain without contrast - Frequent neuro checks - Echocardiogram - Carotid dopplers - Prophylactic therapy-Antiplatelet med: Aspirin - dose 325mg  PO or 300mg  PR - Risk factor modification - Telemetry monitoring - PT consult, OT consult, Speech consult - Stroke team to follow    This patient is critically ill and at significant risk of neurological worsening, death and care requires constant monitoring of vital signs, hemodynamics,respiratory and cardiac monitoring, neurological assessment, discussion with family, other specialists and medical decision making of high complexity. I spent 60 minutes of neurocritical care time  in the care of  this patient. This was time spent independent of any time provided by nurse practitioner or PA.  - HgbA1c, fasting lipid panel - MRI, MRA  of the brain without contrast - Frequent neuro checks - Echocardiogram - Carotid dopplers - Prophylactic therapy-Antiplatelet med: Aspirin - dose 325mg  PO or 300mg  PR - Risk factor modification - Telemetry monitoring - PT consult, OT consult, Speech consult - Stroke team to follow

## 2018-11-05 NOTE — ED Notes (Signed)
[facility] here to transport Pt to [facility] ER.

## 2018-11-05 NOTE — Progress Notes (Signed)
CODE STROKE CT TIMES 1216 CALL TIME 1215 BEEPER TIME 1224 EXAM STARTED 1226 EXAM FINISHED  1227 EXAM COMPLETED IN EPIC 1228  RADIOLOGY CALLED

## 2018-11-05 NOTE — Anesthesia Procedure Notes (Signed)
Procedure Name: Intubation Performed by: Milford Cage, CRNA Pre-anesthesia Checklist: Patient identified, Emergency Drugs available, Suction available and Patient being monitored Patient Re-evaluated:Patient Re-evaluated prior to induction Oxygen Delivery Method: Circle System Utilized Preoxygenation: Pre-oxygenation with 100% oxygen Induction Type: IV induction Laryngoscope Size: Mac and 3 Grade View: Grade II Tube type: Oral Tube size: 7.5 mm Number of attempts: 1 Airway Equipment and Method: Stylet and Oral airway Placement Confirmation: ETT inserted through vocal cords under direct vision,  positive ETCO2 and breath sounds checked- equal and bilateral Secured at: 21 cm Tube secured with: Tape Dental Injury: Teeth and Oropharynx as per pre-operative assessment

## 2018-11-05 NOTE — ED Triage Notes (Addendum)
Pt brought in by EMS . Pt was eating lunch at table sneezed and then went unresponsive.  Gazing to left. Non verbal and unable to follow commands. LKW 1200

## 2018-11-05 NOTE — Sedation Documentation (Signed)
Right groin sheath removed. 8Fr. angioseal deployed

## 2018-11-05 NOTE — Consult Note (Signed)
INR. Post procedure. CT brain NO ICH or mass effect.  RT groin groin puncture hemostasis achieved with an 6F angioseal closure device.Distal DPs and PTs dopplerable bilaterally. S,Monta Maiorana MD

## 2018-11-05 NOTE — Anesthesia Postprocedure Evaluation (Signed)
Anesthesia Post Note  Patient: Angela Higgins  Procedure(s) Performed: IR WITH ANESTHESIA (N/A )     Patient location during evaluation: ICU Anesthesia Type: General Level of consciousness: sedated and patient remains intubated per anesthesia plan Pain management: pain level controlled Vital Signs Assessment: post-procedure vital signs reviewed and stable Respiratory status: patient remains intubated per anesthesia plan Cardiovascular status: stable Postop Assessment: no apparent nausea or vomiting Anesthetic complications: no    Last Vitals:  Vitals:   11/05/18 1738 11/05/18 1740  BP:  (!) 114/102  Pulse:  (!) 57  Resp:  16  Temp:    SpO2: 100% 100%    Last Pain:  Vitals:   11/05/18 1325  TempSrc: Oral                 Audry Pili

## 2018-11-05 NOTE — ED Notes (Signed)
Patient left [facility] via [facility]. Family present and made aware.

## 2018-11-05 NOTE — Transfer of Care (Signed)
Immediate Anesthesia Transfer of Care Note  Patient: Gerrit Halls  Procedure(s) Performed: IR WITH ANESTHESIA (N/A )  Patient Location: ICU  Anesthesia Type:General  Level of Consciousness: sedated and Patient remains intubated per anesthesia plan  Airway & Oxygen Therapy: Patient remains intubated per anesthesia plan and Patient placed on Ventilator (see vital sign flow sheet for setting)  Post-op Assessment: Report given to RN and Post -op Vital signs reviewed and stable  Post vital signs: Reviewed and stable  Last Vitals:  Vitals Value Taken Time  BP 114/102 11/05/18 1740  Temp    Pulse 83 11/05/18 1747  Resp 22 11/05/18 1747  SpO2 100 % 11/05/18 1747  Vitals shown include unvalidated device data.  Last Pain:  Vitals:   11/05/18 1325  TempSrc: Oral         Complications: No apparent anesthesia complications

## 2018-11-05 NOTE — Consult Note (Signed)
INR. 89 Y RT H F  MRSS 2  New onset og aphasia,rt sided weaknes and gaze deviation at 12Noon CT brain No ICH ASPECTS 10 CTP Large penumbra with no core. Endovascular treatment D/W sidter, Procedure,risks,reasons alternatives D/W sister.Risks of ICH of 10 % ,worsening neuro deficit ,death ,inability to revascularize and vascular injury all reviwed. Sister expressed understanding and provided witnessed  consent to proceed. S.Meha Vidrine MD

## 2018-11-05 NOTE — Anesthesia Preprocedure Evaluation (Addendum)
Anesthesia Evaluation  Patient identified by MRN, date of birth, ID band Patient unresponsive  General Assessment Comment: Verbally unresponsive, though awake   Reviewed: Allergy & Precautions, Patient's Chart, lab work & pertinent test results, Unable to perform ROS - Chart review onlyPreop documentation limited or incomplete due to emergent nature of procedure.  History of Anesthesia Complications Negative for: history of anesthetic complications  Airway    Neck ROM: Full   Comment:  Unable to complete airway exam due to patient's mental status  Dental  (+) Partial Upper   Pulmonary    breath sounds clear to auscultation       Cardiovascular hypertension, Pt. on medications + DVT   Rhythm:Regular Rate:Tachycardia   '18 TTE - EF 55% to 60%. Grade 2 diastolic dysfunction. Mild AI, mild MR. Mildly dilated LA, moderately dilated RA. Mild TR. PA peak pressure: 37 mm Hg    Neuro/Psych  Paralysis  CVA, Residual Symptoms    GI/Hepatic   Endo/Other    Renal/GU      Musculoskeletal  (+) Arthritis ,   Abdominal   Peds  Hematology   Anesthesia Other Findings   Reproductive/Obstetrics                            Anesthesia Physical Anesthesia Plan  ASA: IV and emergent  Anesthesia Plan: General   Post-op Pain Management:    Induction: Intravenous and Rapid sequence  PONV Risk Score and Plan: 3 and Treatment may vary due to age or medical condition and Ondansetron  Airway Management Planned: Oral ETT  Additional Equipment: Arterial line  Intra-op Plan:   Post-operative Plan: Possible Post-op intubation/ventilation  Informed Consent: I have reviewed the patients History and Physical, chart, labs and discussed the procedure including the risks, benefits and alternatives for the proposed anesthesia with the patient or authorized representative who has indicated his/her understanding  and acceptance.     Dental advisory given  Plan Discussed with: CRNA and Anesthesiologist  Anesthesia Plan Comments:        Anesthesia Quick Evaluation

## 2018-11-05 NOTE — Anesthesia Procedure Notes (Signed)
Arterial Line Insertion Start/End7/11/2018 2:30 PM, 11/05/2018 2:35 PM Performed by: Audry Pili, MD, Wilburn Cornelia, CRNA, CRNA  Patient location: Pre-op. Preanesthetic checklist: patient identified, IV checked, site marked, risks and benefits discussed, surgical consent, monitors and equipment checked, pre-op evaluation, timeout performed and anesthesia consent Lidocaine 1% used for infiltration radial was placed Catheter size: 20 G Hand hygiene performed  and maximum sterile barriers used   Attempts: 1 Procedure performed without using ultrasound guided technique. Following insertion, dressing applied. Post procedure assessment: normal and unchanged

## 2018-11-05 NOTE — Progress Notes (Signed)
RT note- Called ABG to Dr. Elsworth Soho, verbal to VT moved to true 8 cc/kg, fio2 decreaesd to 50%.

## 2018-11-05 NOTE — ED Notes (Signed)
Pt required oral suctioning due to drooling

## 2018-11-05 NOTE — Sedation Documentation (Signed)
Right groin and pulses checked at bedside

## 2018-11-05 NOTE — ED Provider Notes (Addendum)
[facility] EMERGENCY DEPARTMENT Provider Note   CSN: [id number] Arrival date & time: 11/05/18  1219  An emergency department physician performed an initial assessment on this suspected stroke patient at 1220.  History   Chief Complaint Chief Complaint  Patient presents with   Code Stroke    HPI Angela Higgins is a 85 y.o. female.     The patient is normally alert and oriented x4.  At 12 noon today she started staring off into space and not responding to any verbal stimuli  The history is provided by the EMS personnel.  Altered Mental Status Presenting symptoms: behavior changes   Severity:  Severe Most recent episode:  Today Episode history:  Single Timing:  Constant Progression:  Worsening Chronicity:  New Context: not alcohol use and not dementia   Associated symptoms: no abdominal pain     Past Medical History:  Diagnosis Date   Breast cancer ([place])    DVT (deep venous thrombosis) ([place])    bilateral   Hormone receptor positive breast cancer, right ([place]) 02/12/2016   Hypertension    Paralysis ([place])     Patient Active Problem List   Diagnosis Date Noted   Osteoporosis 11/19/2017   Elevated troponin 11/14/2016   Nausea & vomiting 11/14/2016   Hormone receptor positive breast cancer, right ([place]) 02/12/2016   DEGENERATIVE [place] DISEASE, LUMBAR SPINE 09/08/2009   SCIATICA 09/08/2009   SCOLIOSIS, LUMBAR SPINE 09/08/2009    Past Surgical History:  Procedure Laterality Date   APPENDECTOMY     HERNIA REPAIR       OB History   No obstetric history on file.      Home Medications    Prior to Admission medications   Medication Sig Start Date End Date Taking? Authorizing Provider  amLODipine (NORVASC) 5 MG tablet Take 5 mg by mouth daily.  01/05/16   [provider]  anastrozole (ARIMIDEX) 1 MG tablet Take 1 tablet (1 mg total) by mouth daily. 07/18/17   Holley Bouche, NP  anastrozole (ARIMIDEX) 1 MG tablet Take 1 tablet (1 mg  total) by mouth daily. 10/09/18   Roger Shelter, FNP  aspirin EC 81 MG tablet Take 81 mg by mouth at bedtime.    [provider]  baclofen (LIORESAL) 10 MG tablet Take 10 mg by mouth 2 (two) times daily as needed for muscle spasms (cramps).    [provider]  lisinopril-hydrochlorothiazide (PRINZIDE,ZESTORETIC) 20-25 MG tablet Take 1 tablet by mouth daily. 11/02/16   [provider]  XARELTO 20 MG TABS tablet  01/08/18   [provider]    Family History Family History  Problem Relation Age of Onset   Heart failure Father     Social History Social History   Tobacco Use   Smoking status: Never Smoker   Smokeless tobacco: Never Used  Substance Use Topics   Alcohol use: No   Drug use: No     Allergies   Patient has no known allergies.   Review of Systems Review of Systems  Unable to perform ROS: Mental status change  Gastrointestinal: Negative for abdominal pain.     Physical Exam Updated Vital Signs BP (!) 186/122 (BP Location: Left Arm)    Pulse 88    Resp 19    SpO2 100%   Physical Exam Vitals signs and nursing note reviewed.  Constitutional:      Appearance: She is well-developed.  HENT:     Head: Normocephalic.  Eyes:  General: No scleral icterus.    Conjunctiva/sclera: Conjunctivae normal.  Neck:     Musculoskeletal: Neck supple.     Thyroid: No thyromegaly.  Cardiovascular:     Rate and Rhythm: Normal rate and regular rhythm.     Heart sounds: No murmur. No friction rub. No gallop.   Pulmonary:     Breath sounds: No stridor. No wheezing or rales.  Chest:     Chest wall: No tenderness.  Abdominal:     General: There is no distension.     Tenderness: There is no abdominal tenderness. There is no rebound.  Musculoskeletal: Normal range of motion.  Lymphadenopathy:     Cervical: No cervical adenopathy.  Skin:    Findings: No erythema or rash.  Neurological:     Motor: No abnormal muscle tone.      Coordination: Coordination normal.     Comments: Patient alert but not responding to any verbal stimuli only painful stimuli and she moves oxygen extremities but has an old injury to her left arm      ED Treatments / Results  Labs (all labs ordered are listed, but only abnormal results are displayed) Labs Reviewed  CBC - Abnormal; Notable for the following components:      Result Value   RBC 3.46 (*)    Hemoglobin 9.6 (*)    HCT 31.6 (*)    RDW 15.9 (*)    All other components within normal limits  DIFFERENTIAL - Abnormal; Notable for the following components:   Eosinophils Absolute 1.4 (*)    All other components within normal limits  COMPREHENSIVE METABOLIC PANEL - Abnormal; Notable for the following components:   BUN 40 (*)    Creatinine, Ser 1.32 (*)    GFR calc non Af Amer 34 (*)    GFR calc Af Amer 40 (*)    All other components within normal limits  SARS CORONAVIRUS 2 (HOSPITAL ORDER, [facility])  PROTIME-INR  APTT  ETHANOL  RAPID URINE DRUG SCREEN, HOSP PERFORMED  URINALYSIS, ROUTINE W REFLEX MICROSCOPIC  CBG MONITORING, ED  I-STAT CHEM 8, ED    EKG None  Radiology Ct Angio Head W Or Wo Contrast  Result Date: 11/05/2018 CLINICAL DATA:  Stroke syndrome EXAM: CT ANGIOGRAPHY HEAD AND NECK CT PERFUSION BRAIN TECHNIQUE: Multidetector CT imaging of the head and neck was performed using the standard protocol during bolus administration of intravenous contrast. Multiplanar CT image reconstructions and MIPs were obtained to evaluate the vascular anatomy. Carotid stenosis measurements (when applicable) are obtained utilizing NASCET criteria, using the distal internal carotid diameter as the denominator. Multiphase CT imaging of the brain was performed following IV bolus contrast injection. Subsequent parametric perfusion maps were calculated using RAPID software. CONTRAST:  18mL OMNIPAQUE IOHEXOL 350 MG/ML SOLN COMPARISON:  Noncontrast CT from  earlier today FINDINGS: CTA NECK FINDINGS Aortic arch: Atherosclerotic plaque.  Two vessel branching. Right carotid system: Limited atheromatous changes for age. No stenosis. Subtle mid ICA beading. Left carotid system: Limited atheromatous changes for age. No ulceration or stenosis. There is mid ICA beading. Vertebral arteries: Left subclavian origin bulky calcified plaque without flow limiting stenosis. Both vertebral arteries are widely patent to the dura with distal bilateral vertebral beading, better seen on the right. Skeleton: No acute or aggressive finding.  Degenerative disease. Other neck: Negative for mass or swelling Upper chest: Partial coverage of the heart shows a left atrial appendage that is incompletely opacified Review of the MIP images  confirms the above findings CTA HEAD FINDINGS Anterior circulation: Abrupt cut off at the left M1 segment extending into the M2 segments. There is downstream reconstitution. Atherosclerotic plaque on the carotid siphons. There is a moderate narrowing at the right supraclinoid ICA segment. Hypoplastic right A1 segment. Posterior circulation: Vertebral and basilar arteries are smooth and widely patent. No branch occlusion or proximal flow limiting stenosis. Venous sinuses: Patent Anatomic variants: As above Delayed phase: Not obtained Review of the MIP images confirms the above findings CT Brain Perfusion Findings: ASPECTS: 10 CBF (<30%) Volume: 69mL Perfusion (Tmax>6.0s) volume: 168mL-overestimated given there is artifactual signal along the bilateral cerebral convexities. Mismatch Volume: 1101mL These results were called by telephone at the time of interpretation on 11/05/2018 at 1:13 pm to Dr. Milton Ferguson , who verbally acknowledged these results. IMPRESSION: 1. Emergent large vessel occlusion with embolism obstructing the left M1 segment. 2. No infarct by CT perfusion or prior noncontrast head CT. There penumbra throughout the left MCA territory (exact volume  overestimated by motion artifact). 3. Non opacified left atrial appendage, question underlying atrial fibrillation. 4. Fibromuscular dysplasia in the neck without superimposed ulceration or stenosis. 5. Atherosclerosis is overall mild for age, although there is a notable moderate right supraclinoid ICA stenosis. Electronically Signed   By: Monte Fantasia M.D.   On: 11/05/2018 13:20   Ct Angio Neck W And/or Wo Contrast  Result Date: 11/05/2018 CLINICAL DATA:  Stroke syndrome EXAM: CT ANGIOGRAPHY HEAD AND NECK CT PERFUSION BRAIN TECHNIQUE: Multidetector CT imaging of the head and neck was performed using the standard protocol during bolus administration of intravenous contrast. Multiplanar CT image reconstructions and MIPs were obtained to evaluate the vascular anatomy. Carotid stenosis measurements (when applicable) are obtained utilizing NASCET criteria, using the distal internal carotid diameter as the denominator. Multiphase CT imaging of the brain was performed following IV bolus contrast injection. Subsequent parametric perfusion maps were calculated using RAPID software. CONTRAST:  178mL OMNIPAQUE IOHEXOL 350 MG/ML SOLN COMPARISON:  Noncontrast CT from earlier today FINDINGS: CTA NECK FINDINGS Aortic arch: Atherosclerotic plaque.  Two vessel branching. Right carotid system: Limited atheromatous changes for age. No stenosis. Subtle mid ICA beading. Left carotid system: Limited atheromatous changes for age. No ulceration or stenosis. There is mid ICA beading. Vertebral arteries: Left subclavian origin bulky calcified plaque without flow limiting stenosis. Both vertebral arteries are widely patent to the dura with distal bilateral vertebral beading, better seen on the right. Skeleton: No acute or aggressive finding.  Degenerative disease. Other neck: Negative for mass or swelling Upper chest: Partial coverage of the heart shows a left atrial appendage that is incompletely opacified Review of the MIP images  confirms the above findings CTA HEAD FINDINGS Anterior circulation: Abrupt cut off at the left M1 segment extending into the M2 segments. There is downstream reconstitution. Atherosclerotic plaque on the carotid siphons. There is a moderate narrowing at the right supraclinoid ICA segment. Hypoplastic right A1 segment. Posterior circulation: Vertebral and basilar arteries are smooth and widely patent. No branch occlusion or proximal flow limiting stenosis. Venous sinuses: Patent Anatomic variants: As above Delayed phase: Not obtained Review of the MIP images confirms the above findings CT Brain Perfusion Findings: ASPECTS: 10 CBF (<30%) Volume: 61mL Perfusion (Tmax>6.0s) volume: 130mL-overestimated given there is artifactual signal along the bilateral cerebral convexities. Mismatch Volume: 163mL These results were called by telephone at the time of interpretation on 11/05/2018 at 1:13 pm to Dr. Milton Ferguson , who verbally acknowledged these results. IMPRESSION: 1. Emergent  large vessel occlusion with embolism obstructing the left M1 segment. 2. No infarct by CT perfusion or prior noncontrast head CT. There penumbra throughout the left MCA territory (exact volume overestimated by motion artifact). 3. Non opacified left atrial appendage, question underlying atrial fibrillation. 4. Fibromuscular dysplasia in the neck without superimposed ulceration or stenosis. 5. Atherosclerosis is overall mild for age, although there is a notable moderate right supraclinoid ICA stenosis. Electronically Signed   By: Monte Fantasia M.D.   On: 11/05/2018 13:20   Ct Cerebral Perfusion W Contrast  Result Date: 11/05/2018 CLINICAL DATA:  Stroke syndrome EXAM: CT ANGIOGRAPHY HEAD AND NECK CT PERFUSION BRAIN TECHNIQUE: Multidetector CT imaging of the head and neck was performed using the standard protocol during bolus administration of intravenous contrast. Multiplanar CT image reconstructions and MIPs were obtained to evaluate the vascular  anatomy. Carotid stenosis measurements (when applicable) are obtained utilizing NASCET criteria, using the distal internal carotid diameter as the denominator. Multiphase CT imaging of the brain was performed following IV bolus contrast injection. Subsequent parametric perfusion maps were calculated using RAPID software. CONTRAST:  173mL OMNIPAQUE IOHEXOL 350 MG/ML SOLN COMPARISON:  Noncontrast CT from earlier today FINDINGS: CTA NECK FINDINGS Aortic arch: Atherosclerotic plaque.  Two vessel branching. Right carotid system: Limited atheromatous changes for age. No stenosis. Subtle mid ICA beading. Left carotid system: Limited atheromatous changes for age. No ulceration or stenosis. There is mid ICA beading. Vertebral arteries: Left subclavian origin bulky calcified plaque without flow limiting stenosis. Both vertebral arteries are widely patent to the dura with distal bilateral vertebral beading, better seen on the right. Skeleton: No acute or aggressive finding.  Degenerative disease. Other neck: Negative for mass or swelling Upper chest: Partial coverage of the heart shows a left atrial appendage that is incompletely opacified Review of the MIP images confirms the above findings CTA HEAD FINDINGS Anterior circulation: Abrupt cut off at the left M1 segment extending into the M2 segments. There is downstream reconstitution. Atherosclerotic plaque on the carotid siphons. There is a moderate narrowing at the right supraclinoid ICA segment. Hypoplastic right A1 segment. Posterior circulation: Vertebral and basilar arteries are smooth and widely patent. No branch occlusion or proximal flow limiting stenosis. Venous sinuses: Patent Anatomic variants: As above Delayed phase: Not obtained Review of the MIP images confirms the above findings CT Brain Perfusion Findings: ASPECTS: 10 CBF (<30%) Volume: 39mL Perfusion (Tmax>6.0s) volume: 12mL-overestimated given there is artifactual signal along the bilateral cerebral  convexities. Mismatch Volume: 121mL These results were called by telephone at the time of interpretation on 11/05/2018 at 1:13 pm to Dr. Milton Ferguson , who verbally acknowledged these results. IMPRESSION: 1. Emergent large vessel occlusion with embolism obstructing the left M1 segment. 2. No infarct by CT perfusion or prior noncontrast head CT. There penumbra throughout the left MCA territory (exact volume overestimated by motion artifact). 3. Non opacified left atrial appendage, question underlying atrial fibrillation. 4. Fibromuscular dysplasia in the neck without superimposed ulceration or stenosis. 5. Atherosclerosis is overall mild for age, although there is a notable moderate right supraclinoid ICA stenosis. Electronically Signed   By: Monte Fantasia M.D.   On: 11/05/2018 13:20   Ct Head Code Stroke Wo Contrast  Result Date: 11/05/2018 CLINICAL DATA:  Code stroke.  Unresponsive beginning 20 minutes ago. EXAM: CT HEAD WITHOUT CONTRAST TECHNIQUE: Contiguous axial images were obtained from the base of the skull through the vertex without intravenous contrast. COMPARISON:  None. FINDINGS: Brain: No evidence of acute infarction, hemorrhage, hydrocephalus,  extra-axial collection or mass lesion/mass effect. Remote right lateral lenticulostriate infarct affecting the basal ganglia and internal capsule. Cerebral volume loss in keeping with age Vascular: Hyperdense left M1 segment extending into the M2 branches. Skull: Normal. Negative for fracture or focal lesion. Sinuses/Orbits: Negative Other: Critical Value/emergent results were called by telephone at the time of interpretation on 11/05/2018 at 12:36 pm to Dr. Milton Ferguson , who verbally acknowledged these results. ASPECTS [facility] Stroke Program Early CT Score) - Ganglionic level infarction (caudate, lentiform nuclei, internal capsule, insula, M1-M3 cortex): 7 - Supraganglionic infarction (M4-M6 cortex): 3 Total score (0-10 with 10 being normal): 10 IMPRESSION:  1. Hyperdense left MCA suggesting large vessel embolism. No hemorrhage or visible infarct. ASPECTS is 10. 2. Remote right lateral lenticulostriate infarct. Electronically Signed   By: Monte Fantasia M.D.   On: 11/05/2018 12:38    Procedures Procedures (including critical care time)  Medications Ordered in ED Medications  iohexol (OMNIPAQUE) 350 MG/ML injection 100 mL (100 mLs Intravenous Contrast Given 11/05/18 1305)     Initial Impression / Assessment and Plan / ED Course  I have reviewed the triage vital signs and the nursing notes.  Pertinent labs & imaging results that were available during my care of the patient were reviewed by me and considered in my medical decision making (see chart for details).     CRITICAL CARE Performed by: Milton Ferguson Total critical care time:76minutes Critical care time was exclusive of separately billable procedures and treating other patients. Critical care was necessary to treat or prevent imminent or life-threatening deterioration. Critical care was time spent personally by me on the following activities: development of treatment plan with patient and/or surrogate as well as nursing, discussions with consultants, evaluation of patient's response to treatment, examination of patient, obtaining history from patient or surrogate, ordering and performing treatments and interventions, ordering and review of laboratory studies, ordering and review of radiographic studies, pulse oximetry and re-evaluation of patient's condition.  Patient with a large left MCA stroke.  Neurology has been contacted and she will be transferred to [facility] for possible interventional radiology treatment.  Patient is being transferred to [facility] ED. Dr. Vanita Panda in the emergency department is excepting the patient and neurology Dr. Katherine Roan will take care of the patient also Final Clinical Impressions(s) / ED Diagnoses   Final diagnoses:  Cerebrovascular accident (CVA)  due to thrombosis of precerebral artery [facility])    ED Discharge Orders    None       Milton Ferguson, MD 11/05/18 1327    Milton Ferguson, MD 11/05/18 1328

## 2018-11-05 NOTE — ED Notes (Signed)
Return from CT

## 2018-11-05 NOTE — Consult Note (Signed)
NAME:  Angela Higgins, MRN:  [id number], DOB:  07/23/1921, LOS: 0 ADMISSION DATE:  11/05/2018, CONSULTATION DATE:  11/05/2018 REFERRING MD:  Neurology CHIEF COMPLAINT:  CVA  Brief History   85yo F PMH R hemispheric CVA /c chronic spastic hemiparesis, DVT on eliquis, Breast CA, CKD 3, A&Ox4 at baseline presented to [facility] /c acute unresponsiveness, left gaze preference, aphasic and not following commands. Code Stroke activated via tele neurology.CTA revealed L M1 occlsuion with significant penumbra. Deemed not a candidate for alteplase due to use of NOAC, on xarelto. Case d/w [facility] and pt transferred to IR for mechanical thrombectomy and revascularization.   History of present illness   85yo F PMH R hemispheric CVA /c chronic spastic hemiparesis, DVT on Eliquis, Breast CA,CKD 3, A&Ox4 at baseline presented to [facility] /c onset of acute unresponsiveness, left gaze preference, aphasic and not following commands. She was in her usual state of health until around noon this date when she was eating lunch and developed sudden right sided weakness. Per EMR she sneezed and became unresponsive. EMS was summoned. On arrival to ED she was hypertensive, BP 186/122, alert but not responding to verbal stimuli, only withdrawing to painful stimuli. Code Stroke activated via tele neurology. CTA revealed L M1 occlsuion with significant penumbra. Deemed not a candidate for alteplase due to use of NOAC, on xarelto. Case d/w [facility] and pt transferred to IR for mechanical thrombectomy and revascularization.   She was intubated prior to procedure d/t inability to protect airway. PCCM consulted for CC management,   Past Medical History  R hemispheric CVA /c chronic hemiparesis  Breast CA on Arimidex DVT on Eliquis  Osteoporosis on [place] [facility] Events   7/8 Mechanical thrombectomy and revascularization   Consults:  Neurology PCCM   Procedures/Lines:  7/8 Mechanical thrombectomy and  revascularization  ETI 7/8>> [place] 7/8  Significant Diagnostic Tests:  CT/CTA head "IMPRESSION: 1. Emergent large vessel occlusion with embolism obstructing the left M1 segment. 2. No infarct by CT perfusion or prior noncontrast head CT. There penumbra throughout the left MCA territory (exact volume overestimated by motion artifact). 3. Non opacified left atrial appendage, question underlying atrial fibrillation. 4. Fibromuscular dysplasia in the neck without superimposed ulceration or stenosis. 5. Atherosclerosis is overall mild for age, although there is a notable moderate right supraclinoid ICA stenosis."  Echo 11/14/16  EF 55-60% G2 DD AV mild regurg MV mild regurg RA moderately dilated PFO TV mild regurg Micro Data:  SARS CoV 2 negative   Antimicrobials:  Not indicated   Interim history/subjective:  Transferred to [facility] IR for thrombectomy.  Admitted to ICU.   Objective   Blood pressure (!) 161/80, pulse 88, temperature 97.8 F (36.6 C), temperature source Oral, resp. rate 16, height 5' (1.524 m), SpO2 96 %.    Vent Mode: PRVC FiO2 (%):  [100 %] 100 % Set Rate:  [16 bmp] 16 bmp Vt Set:  [420 mL] 420 mL PEEP:  [5 cmH20] 5 cmH20 Plateau Pressure:  [13 cmH20] 13 cmH20   Intake/Output Summary (Last 24 hours) at 11/05/2018 1716 Last data filed at 11/05/2018 1545 Gross per 24 hour  Intake -  Output 100 ml  Net -100 ml   There were no vitals filed for this visit.  Examination:   Resolved Hospital Problem list     Assessment & Plan:  This is a 85 yo F with Acute L MCA CVA s/p thrombectomy with resultant respiratory failure  d/t inability to protect airway.   Acute L MCA CVA Plan -per stroke neuro -prn anti-HTN and/or infusion if needed to maintain SBP 120-140 -neuro checks  Acute respiratory failure Plan -CXR, ABG now -mechanical ventilation to prevent hypoxia and hypercarbia -sedation/analgesia/anxiolysis for CPOT and goal RASS 0 to maintain vent  synchrony -vent bundle   Best practice:  Diet: NPO Pain/Anxiety/Delirium protocol (if indicated): yes VAP protocol (if indicated): yes DVT prophylaxis: SCDs GI prophylaxis: PPI Glucose control: SSI Mobility: PT/OT per stroke path when appropriate  Code Status: FULL  Family Communication: will update  Disposition: admit Neuro ICU   Labs   CBC: Recent Labs  Lab 11/05/18 1223 11/05/18 1320  WBC 5.0  --   NEUTROABS 1.9  --   HGB 9.6* 11.6*  HCT 31.6* 34.0*  MCV 91.3  --   PLT 186  --     Basic Metabolic Panel: Recent Labs  Lab 11/05/18 1223 11/05/18 1320  NA 141 140  K 4.0 5.0  CL 107 105  CO2 26  --   GLUCOSE 92 86  BUN 40* 52*  CREATININE 1.32* 1.20*  CALCIUM 9.3  --    GFR: CrCl cannot be calculated (Unknown ideal weight.). Recent Labs  Lab 11/05/18 1223  WBC 5.0    Liver Function Tests: Recent Labs  Lab 11/05/18 1223  AST 21  ALT 13  ALKPHOS 65  BILITOT 0.3  PROT 7.4  ALBUMIN 3.5   No results for input(s): LIPASE, AMYLASE in the last 168 hours. No results for input(s): AMMONIA in the last 168 hours.  ABG    Component Value Date/Time   TCO2 30 11/05/2018 1320     Coagulation Profile: Recent Labs  Lab 11/05/18 1223  INR 1.0    Cardiac Enzymes: No results for input(s): CKTOTAL, CKMB, CKMBINDEX, TROPONINI in the last 168 hours.  HbA1C: No results found for: HGBA1C  CBG: Recent Labs  Lab 11/05/18 1223  GLUCAP 86    Review of Systems:   Complete ROS not obtained as pt is intubated and sedated, post procedure, family not immediately available, otherwise as noted in HPI.  Past Medical History  She,  has a past medical history of Breast cancer ([place]), DVT (deep venous thrombosis) ([place]), Hormone receptor positive breast cancer, right ([place]) (02/12/2016), Hypertension, and Paralysis ([place]).   Surgical History    Past Surgical History:  Procedure Laterality Date  . APPENDECTOMY    . HERNIA REPAIR       Social History    reports that she has never smoked. She has never used smokeless tobacco. She reports that she does not drink alcohol or use drugs.   Family History   Her family history includes Heart failure in her father.   Allergies No Known Allergies   Home Medications  Prior to Admission medications   Medication Sig Start Date End Date Taking? Authorizing Provider  amLODipine (NORVASC) 5 MG tablet Take 5 mg by mouth daily.  01/05/16   [provider]  anastrozole (ARIMIDEX) 1 MG tablet Take 1 tablet (1 mg total) by mouth daily. 07/18/17   Holley Bouche, NP  anastrozole (ARIMIDEX) 1 MG tablet Take 1 tablet (1 mg total) by mouth daily. 10/09/18   Roger Shelter, FNP  aspirin EC 81 MG tablet Take 81 mg by mouth at bedtime.    [provider]  baclofen (LIORESAL) 10 MG tablet Take 10 mg by mouth 2 (two) times daily as needed for muscle spasms (cramps).  [provider]  lisinopril-hydrochlorothiazide (PRINZIDE,ZESTORETIC) 20-25 MG tablet Take 1 tablet by mouth daily. 11/02/16   [provider]  Alveda Reasons 20 MG TABS tablet  01/08/18   [provider]     Critical care time:     Francine Graven, MSN, AGACNP  Pager [phone] or if no answer [phone] [facility]  Independently examined pt, evaluated data & formulated above care plan with NP  85 year old presenting with sudden onset aphasia and right-sided weakness, apparently functional at baseline, BP on arrival 186/120, CT angiogram suggested large vessel occlusion, left M1, not given TPA since being on Xarelto, transferred to IR for revascularization, failed attempts with reocclusion due to atherosclerotic disease Sheath removed and transferred to ICU intubated  On exam-unresponsive, elderly, ET tube at 21 cm at lip breath sounds bilateral, S1-S2 regular, soft nontender abdomen, pupils 3 mm bilaterally not reactive, surgical  Labs reviewed Chest x-ray pending Impression/plan   Acute respiratory insufficiency-vent settings reviewed and adjusted, 8 cc/kg, check ABG and chest x-ray  Hypertension-needed Neo-Synephrine during procedure, now hypotensive, use Cleviprex acutely and transition using hydralazine and labetalol as needed Hold lisinopril at this time since renal function may change  Management of stroke per neurology and neuro IR  Rakesh V. Elsworth Soho MD

## 2018-11-05 NOTE — ED Notes (Addendum)
Neurologist spoke with sister and sister verified that pt is currently on xarelto

## 2018-11-05 NOTE — Procedures (Signed)
S/P Lt common carotid arteriogram followed by endovascular revascularization of occluded Lt MCa M 1 seg with x 2 passes with 26mm x 33 mm embotrap retriever device and x 1 pass with 29mm x 40 mm solitaireX retriever device achieving a TICI 2 B revascularization with reocclusion due to  underlying severe ICAD.Marland Kitchen

## 2018-11-05 NOTE — Sedation Documentation (Signed)
No urinary catheter to be placed at this time, per Dr. Estanislado Pandy

## 2018-11-05 NOTE — Progress Notes (Signed)
[facility] TeleNeurology Consult Services   Date of Service:   11/05/2018 12:32:24  Impression:     .  Rule Out Acute Ischemic Stroke     .  Left Hemispheric Infarct     .  MCA Distribution Infarct  Comments/Sign-Out: Patient with chronic right hemispheric stroke and has spastic hemiparesis who presents with acute unresponsiveness and left gaze preference. She is aphasic, not following commands and no spontaneous movement of extremities. Presentation c/w left MCA syndrome. Not a candidate for alteplase due to use of NOAC, on xarelto per family and chart review. CT c/w dense left MCA, CTA left M1/2 occlusion. d/w [facility] Dr. Katherine Roan patient to be transferred for possible intervention.  Mechanism of Stroke: Possible Cardioembolic  Metrics: Last Known Well: 11/05/2018 12:00:00 [facility] Notification Time: 11/05/2018 12:32:24 Arrival Time: 11/05/2018 12:19:00 Stamp Time: 11/05/2018 12:32:24 Time First Login Attempt: 11/05/2018 12:41:08 Video Start Time: 11/05/2018 12:41:08  Symptoms: unresponsive and left gaze NIHSS Start Assessment Time: 11/05/2018 12:57:11 Patient is not a candidate for tPA. Patient was not deemed candidate for tPA thrombolytics because of Use of NOAs within 48 hours.  CT head showed no acute hemorrhage or acute core infarct. CT head was reviewed and results were: aspects 10 remote infarct dense left mca sign  Clinical Presentation is Suggestive of Large Vessel Occlusive Disease, Recommendations are as Follows  CTA Head and Neck. CT Perfusion. Advanced Imaging is Suggestive of Large Vessel Occlusion, Neurointerventional Specialist to be Consulted.   Radiologist was not called back for review of advanced imaging because left MCA occlusion ED Physician notified of diagnostic impression and management plan on 11/05/2018 13:08:54  Our recommendations are outlined below.  Recommendations:     .  Activate Stroke  Protocol Admission/Order Set     .  Stroke/Telemetry Floor     .  Neuro Checks     .  Bedside Swallow Eval     .  DVT Prophylaxis     .  IV Fluids, Normal Saline     .  Head of Bed 30 Degrees     .  Euglycemia and Avoid Hyperthermia (PRN Acetaminophen)     .  hold anticoagulation     .  admit to stroke unit for workup  Routine Consultation with [facility] for Follow up Care  Sign Out:     .  Discussed with Emergency Department Provider    ------------------------------------------------------------------------------  History of Present Illness: Patient is a 85 year old Female.  Patient was brought by EMS for symptoms of unresponsive and left gaze  Patient was eating lunch with family. She sneezed and went unresponsive with left gaze preference. Patient is not following commands. Has a history of breast ca, HTN, DVT ?xarelto per June 11th note. PTT 31 INR 1, plts 186 Per the patient's sister Ms Shelly Bombard, she is on a blood thinners for hx DVT, no recent surgeries, no prior ICH, no liver disease, no GI malignancies, no coagulopathies. Per sister, patient is independent. mRS 1-2.  Last seen normal was within 4.5 hours. There is no history of hemorrhagic complications or intracranial hemorrhage. There is history of Recent Anticoagulants. There is no history of recent major surgery. There is no history of recent stroke.  Examination: BP(186/122), Pulse(78), Blood Glucose(92) 1A: Level of Consciousness - Alert; keenly responsive + 0 1B: Ask Month and Age - Aphasic + 2 1C: Blink Eyes & Squeeze Hands - Performs 0 Tasks + 2 2: Test Horizontal Extraocular Movements - Forced  Gaze Palsy: Cannot Be Overcome + 2 3: Test Visual Fields - No Visual Loss + 0 4: Test Facial Palsy (Use Grimace if Obtunded) - Normal symmetry + 0 5A: Test Left Arm Motor Drift - No Movement + 4 5B: Test Right Arm Motor Drift - No Movement + 4 6A: Test Left Leg Motor Drift - No Movement + 4 6B: Test Right  Leg Motor Drift - No Movement + 4 7: Test Limb Ataxia (FNF/Heel-Shin) - No Ataxia + 0 8: Test Sensation - Normal; No sensory loss + 0 9: Test Language/Aphasia - Mute/Global Aphasia: No Usable Speech/Auditory Comprehension + 3 10: Test Dysarthria - Mute/Anarthric + 2 11: Test Extinction/Inattention - Visual/tactile/auditory/spatial/personal inattention + 1  NIHSS Score: 28  Patient/Family was informed the Neurology Consult would happen via TeleHealth consult by way of interactive audio and video telecommunications and consented to receiving care in this manner.  Due to the immediate potential for life-threatening deterioration due to underlying acute neurologic illness, I spent 35 minutes providing critical care. This time includes time for face to face visit via telemedicine, review of medical records, imaging studies and discussion of findings with providers, the patient and/or family.   Dr Jacqulynn Cadet Umeka Wrench   [facility] [phone]   Case [id number]

## 2018-11-06 ENCOUNTER — Encounter (HOSPITAL_COMMUNITY): Payer: Self-pay | Admitting: Interventional Radiology

## 2018-11-06 ENCOUNTER — Inpatient Hospital Stay (HOSPITAL_COMMUNITY): Payer: Medicare HMO

## 2018-11-06 DIAGNOSIS — I341 Nonrheumatic mitral (valve) prolapse: Secondary | ICD-10-CM

## 2018-11-06 DIAGNOSIS — I361 Nonrheumatic tricuspid (valve) insufficiency: Secondary | ICD-10-CM

## 2018-11-06 DIAGNOSIS — J9601 Acute respiratory failure with hypoxia: Secondary | ICD-10-CM

## 2018-11-06 LAB — CBC WITH DIFFERENTIAL/PLATELET
Abs Immature Granulocytes: 0.02 10*3/uL (ref 0.00–0.07)
Basophils Absolute: 0 10*3/uL (ref 0.0–0.1)
Basophils Relative: 0 %
Eosinophils Absolute: 0 10*3/uL (ref 0.0–0.5)
Eosinophils Relative: 0 %
HCT: 26 % — ABNORMAL LOW (ref 36.0–46.0)
Hemoglobin: 8.1 g/dL — ABNORMAL LOW (ref 12.0–15.0)
Immature Granulocytes: 0 %
Lymphocytes Relative: 8 %
Lymphs Abs: 0.6 10*3/uL — ABNORMAL LOW (ref 0.7–4.0)
MCH: 27.9 pg (ref 26.0–34.0)
MCHC: 31.2 g/dL (ref 30.0–36.0)
MCV: 89.7 fL (ref 80.0–100.0)
Monocytes Absolute: 0.4 10*3/uL (ref 0.1–1.0)
Monocytes Relative: 5 %
Neutro Abs: 5.8 10*3/uL (ref 1.7–7.7)
Neutrophils Relative %: 87 %
Platelets: 160 10*3/uL (ref 150–400)
RBC: 2.9 MIL/uL — ABNORMAL LOW (ref 3.87–5.11)
RDW: 15.8 % — ABNORMAL HIGH (ref 11.5–15.5)
WBC: 6.7 10*3/uL (ref 4.0–10.5)
nRBC: 0 % (ref 0.0–0.2)

## 2018-11-06 LAB — BASIC METABOLIC PANEL
Anion gap: 8 (ref 5–15)
BUN: 30 mg/dL — ABNORMAL HIGH (ref 8–23)
CO2: 19 mmol/L — ABNORMAL LOW (ref 22–32)
Calcium: 8 mg/dL — ABNORMAL LOW (ref 8.9–10.3)
Chloride: 112 mmol/L — ABNORMAL HIGH (ref 98–111)
Creatinine, Ser: 1.3 mg/dL — ABNORMAL HIGH (ref 0.44–1.00)
GFR calc Af Amer: 41 mL/min — ABNORMAL LOW (ref >60)
GFR calc non Af Amer: 35 mL/min — ABNORMAL LOW (ref >60)
Glucose, Bld: 151 mg/dL — ABNORMAL HIGH (ref 70–99)
Potassium: 3.9 mmol/L (ref 3.5–5.1)
Sodium: 139 mmol/L (ref 135–145)

## 2018-11-06 LAB — ECHOCARDIOGRAM COMPLETE
Height: 60 in
Weight: 1802.48 oz

## 2018-11-06 LAB — LIPID PANEL
Cholesterol: 188 mg/dL (ref 0–200)
HDL: 63 mg/dL (ref >40)
LDL Cholesterol: 110 mg/dL — ABNORMAL HIGH (ref 0–99)
Total CHOL/HDL Ratio: 3 RATIO
Triglycerides: 75 mg/dL (ref <150)
VLDL: 15 mg/dL (ref 0–40)

## 2018-11-06 MED ORDER — HYDRALAZINE HCL 20 MG/ML IJ SOLN
10.0000 mg | Freq: Four times a day (QID) | INTRAMUSCULAR | Status: DC | PRN
  Administered 2018-11-06: 10 mg via INTRAVENOUS
  Filled 2018-11-06: qty 1

## 2018-11-06 NOTE — Progress Notes (Signed)
Pt had bradycardic episode- as low as 35 then shot up to 120's. Called [facility] and told to draw labs early. Will continue to monitor.

## 2018-11-06 NOTE — Progress Notes (Signed)
Per Dr. Leonie Man, SBP <140 until 24 hrs post op.  After 24 hrs at around 1730 today, keep SBP <180

## 2018-11-06 NOTE — Progress Notes (Signed)
Initial Nutrition Assessment  DOCUMENTATION CODES:   Not applicable  INTERVENTION:   If aggressive care is pursued, recommend initiation of tube feeds: - Vital High Protein @ 20 ml/hr (480 ml/day) - Pro-stat 30 ml BID  Recommended tube feeding regimen provides 680 kcal, 72 grams of protein, and 401 ml of H2O.   Recommended tube feeding regimen and current cleviprex provides 1640 total kcal (>100% of needs).  NUTRITION DIAGNOSIS:   Inadequate oral intake related to inability to eat as evidenced by NPO status.  GOAL:   Patient will meet greater than or equal to 90% of their needs  MONITOR:   Labs, I & O's, Weight trends, Vent status, Other (GOC)  REASON FOR ASSESSMENT:   Ventilator    ASSESSMENT:   85 year old female who presented to the ED on 7/08 as a Code Stroke. PMH of breast cancer, HTN, DVT, CKD stage III. Pt intubated in the ED. Pt admitted with acute left MCA syndrome due to occluded left MCA.  7/08 - s/p mechanical thrombectomy and revascularization, failed revascularization of left middle cerebral artery  Weight stable over the last year.  Spoke with RN via phone call. Family moving towards terminal extubation/comfort care. RD will leave tube feeding recommendations for use if aggressive care is pursued.  Patient is currently intubated on ventilator support MV: 9.4 L/min Temp (24hrs), Avg:97.8 F (36.6 C), Min:93.8 F (34.3 C), Max:99.8 F (37.7 C) BP: 79/55 MAP: 67  Propofol: none Cleviprex: 20 ml/hr (provides 960 kcal daily from lipid)  Medications reviewed and include: Protonix  Labs reviewed: LDL 110, hemoglobin 8.1  UOP: 1950 ml x 24 hours I/O's: -607.7 ml since admit  NUTRITION - FOCUSED PHYSICAL EXAM:  Deferred at this time. Family is considering terminal extubation/comfort care but no decision has been made yet per RN.  Diet Order:   Diet Order            Diet NPO time specified  Diet effective now              EDUCATION  NEEDS:   No education needs have been identified at this time  Skin:  Skin Assessment: Reviewed RN Assessment  Last BM:  no documented BM  Height:   Ht Readings from Last 1 Encounters:  11/05/18 5' (1.524 m)    Weight:   Wt Readings from Last 1 Encounters:  11/05/18 51.1 kg    Ideal Body Weight:  45.5 kg  BMI:  Body mass index is 22 kg/m.  Estimated Nutritional Needs:   Kcal:  1181  Protein:  65-80 grams  Fluid:  >/= 1.2 L    Gaynell Face, MS, RD, LDN Inpatient Clinical Dietitian Pager: [phone] Weekend/After Hours: [phone]

## 2018-11-06 NOTE — Progress Notes (Signed)
  Echocardiogram 2D Echocardiogram has been performed.  Angela Higgins 11/06/2018, 12:15 PM

## 2018-11-06 NOTE — Progress Notes (Signed)
NAME:  Angela Higgins, MRN:  [id number], DOB:  [birth year], LOS: 1 ADMISSION DATE:  11/05/2018, CONSULTATION DATE:  11/05/2018 REFERRING MD:  Neurology CHIEF COMPLAINT:  CVA  Brief History   85yo F PMH R hemispheric CVA /c chronic spastic hemiparesis, DVT on eliquis, Breast CA, CKD 3, A&Ox4 at baseline [facility] /c acute unresponsiveness, left gaze preference, aphasic and not following commands. Code Stroke activated via tele neurology.CTA revealed L M1 occlsuion with significant penumbra. Deemed not a candidate for alteplase due to use of NOAC, on xarelto. [facility] and pt transferred to IR for mechanical thrombectomy and revascularization.   History of present illness   85yo F PMH R hemispheric CVA /c chronic spastic hemiparesis, DVT on Eliquis, Breast CA,CKD 3, A&Ox4 at baseline presented to [facility] /c onset of acute unresponsiveness, left gaze preference, aphasic and not following commands. She was in her usual state of health until around noon this date when she was eating lunch and developed sudden right sided weakness. Per EMR she sneezed and became unresponsive. EMS was summoned. On arrival to ED she was hypertensive, BP 186/122, alert but not responding to verbal stimuli, only withdrawing to painful stimuli. Code Stroke activated via tele neurology. CTA revealed L M1 occlsuion with significant penumbra. Deemed not a candidate for alteplase due to use of NOAC, on xarelto. Case d/w [facility] and pt transferred to IR for mechanical thrombectomy and revascularization.   She was intubated prior to procedure d/t inability to protect airway. PCCM consulted for CC management,  11/05/2018 Despite passing wiring revascularization of occluded left middle cerebral artery and reestablishing blood flow reocclusion of her secondary to severe internal carotid arterial disease.  Past Medical History  R hemispheric CVA /c chronic hemiparesis  Breast CA on Arimidex DVT on Eliquis   Osteoporosis on [place] [facility] Events   7/8 Mechanical thrombectomy and revascularization   Consults:  Neurology PCCM   Procedures/Lines:  7/8 Mechanical thrombectomy and revascularization.  Failed revascularization of left middle cerebral artery ETI 7/8>> L radAline 7/8>> 7/8 rt fem sheath>>7/9  Significant Diagnostic Tests:  CT/CTA head "IMPRESSION: 1. Emergent large vessel occlusion with embolism obstructing the left M1 segment. 2. No infarct by CT perfusion or prior noncontrast head CT. There penumbra throughout the left MCA territory (exact volume overestimated by motion artifact). 3. Non opacified left atrial appendage, question underlying atrial fibrillation. 4. Fibromuscular dysplasia in the neck without superimposed ulceration or stenosis. 5. Atherosclerosis is overall mild for age, although there is a notable moderate right supraclinoid ICA stenosis."  Echo 11/14/16  EF 55-60% G2 DD AV mild regurg MV mild regurg RA moderately dilated PFO TV mild regurg Micro Data:  SARS CoV 2 negative   Antimicrobials:  Not indicated   Interim history/subjective:  Transferred to [facility] IR for thrombectomy.  Admitted to ICU.   Objective   Blood pressure (!) 94/26, pulse (!) 122, temperature 99.8 F (37.7 C), temperature source Axillary, resp. rate 16, height 5' (1.524 m), weight 51.1 kg, SpO2 100 %.    Vent Mode: PRVC FiO2 (%):  [30 %-100 %] 30 % Set Rate:  [16 bmp] 16 bmp Vt Set:  [360 mL-420 mL] 400 mL PEEP:  [5 cmH20] 5 cmH20 Plateau Pressure:  [12 cmH20-14 cmH20] 14 cmH20   Intake/Output Summary (Last 24 hours) at 11/06/2018 0917 Last data filed at 11/06/2018 0800 Gross per 24 hour  Intake 1712.28 ml  Output 2150 ml  Net -  437.72 ml   Filed Weights   11/05/18 1740  Weight: 51.1 kg    Examination:   Resolved Hospital Problem list     Assessment & Plan:  This is a 85 yo F with Acute L MCA CVA s/p thrombectomy with resultant respiratory  failure d/t inability to protect airway.   Acute L MCA CVA Plan IR attempted revascularization failed due to internal carotid arterial disease with reocclusion Maintain blood pressure 1 89-2 40 systolic currently on Cleviprex Serial neuro checks Per neurology  Acute respiratory failure Plan Vent bundle Spontaneous breathing trial Inability to protect airway precludes extubation Sedation as needed  History of atrial fibrillation Was on Eliquis as an outpatient Noted to have some bradycardia  Goals of care Per neurology in a 85 year old with a failed interventional radiology for left MCA occlusion    Best practice:  Diet: NPO Pain/Anxiety/Delirium protocol (if indicated): yes VAP protocol (if indicated): yes DVT prophylaxis: SCDs GI prophylaxis: PPI Glucose control: SSI Mobility: PT/OT per stroke path when appropriate  Code Status: FULL  Family Communication: Per neurology Disposition: admit Neuro ICU   Labs   CBC: Recent Labs  Lab 11/05/18 1223 11/05/18 1320 11/05/18 1805 11/06/18 0430  WBC 5.0  --   --  6.7  NEUTROABS 1.9  --   --  5.8  HGB 9.6* 11.6* 8.8* 8.1*  HCT 31.6* 34.0* 26.0* 26.0*  MCV 91.3  --   --  89.7  PLT 186  --   --  119    Basic Metabolic Panel: Recent Labs  Lab 11/05/18 1223 11/05/18 1320 11/05/18 1805 11/06/18 0430  NA 141 140 142 139  K 4.0 5.0 3.6 3.9  CL 107 105  --  112*  CO2 26  --   --  19*  GLUCOSE 92 86  --  151*  BUN 40* 52*  --  30*  CREATININE 1.32* 1.20*  --  1.30*  CALCIUM 9.3  --   --  8.0*   GFR: Estimated Creatinine Clearance: 19 mL/min (A) (by C-G formula based on SCr of 1.3 mg/dL (H)). Recent Labs  Lab 11/05/18 1223 11/06/18 0430  WBC 5.0 6.7    Liver Function Tests: Recent Labs  Lab 11/05/18 1223  AST 21  ALT 13  ALKPHOS 65  BILITOT 0.3  PROT 7.4  ALBUMIN 3.5   No results for input(s): LIPASE, AMYLASE in the last 168 hours. No results for input(s): AMMONIA in the last 168 hours.  ABG     Component Value Date/Time   PHART 7.510 (H) 11/05/2018 1805   PCO2ART 33.2 11/05/2018 1805   PO2ART 469.0 (H) 11/05/2018 1805   HCO3 26.5 11/05/2018 1805   TCO2 27 11/05/2018 1805   O2SAT 100.0 11/05/2018 1805     Coagulation Profile: Recent Labs  Lab 11/05/18 1223  INR 1.0    Cardiac Enzymes: No results for input(s): CKTOTAL, CKMB, CKMBINDEX, TROPONINI in the last 168 hours.  HbA1C: No results found for: HGBA1C  CBG: Recent Labs  Lab 11/05/18 [place] [facility] time: 34 min      Richardson Landry  ACNP [facility] PCCM Pager [phone] till 1 pm If no answer page [phone] 11/06/2018, 9:18 AM

## 2018-11-06 NOTE — Progress Notes (Signed)
Patient transported to and from MRI w/o complications. Trip was uneventful. Patient transported on 100% FiO2.

## 2018-11-06 NOTE — Progress Notes (Signed)
Order to continue foley catheter, do not start urecholine per Dr. Lynetta Mare, family leaning towards comfort care in the future.

## 2018-11-06 NOTE — Progress Notes (Signed)
OT Cancellation Note  Patient Details Name: Angela Higgins MRN: [id number] DOB: 05-Sep-1921   Cancelled Treatment:    Reason Eval/Treat Not Completed: Patient not medically ready. Remains intubated, not on sedation and currently not responsive.  Angela Higgins 11/06/2018, 10:31 AM   Angela Higgins OTR/L Acute Rehabilitation Services Pager: [phone] Office: [phone]

## 2018-11-06 NOTE — Progress Notes (Signed)
Referring Physician(s): Leonel Ramsay  Supervising Physician: Luanne Bras  Patient Status:  [facility] [place] - In-pt  Chief Complaint: Code Stroke  Subjective: Patient remains intubated this AM.  No sedation.  Spontaneous movement on left side only. Not responsive to voice or touch.   Allergies: Patient has no known allergies.  Medications: Prior to Admission medications   Medication Sig Start Date End Date Taking? Authorizing Provider  amLODipine (NORVASC) 5 MG tablet Take 5 mg by mouth daily.  01/05/16   [provider]  anastrozole (ARIMIDEX) 1 MG tablet Take 1 tablet (1 mg total) by mouth daily. 07/18/17   Holley Bouche, NP  anastrozole (ARIMIDEX) 1 MG tablet Take 1 tablet (1 mg total) by mouth daily. 10/09/18   Roger Shelter, FNP  aspirin EC 81 MG tablet Take 81 mg by mouth at bedtime.    [provider]  baclofen (LIORESAL) 10 MG tablet Take 10 mg by mouth 2 (two) times daily as needed for muscle spasms (cramps).    [provider]  lisinopril-hydrochlorothiazide (PRINZIDE,ZESTORETIC) 20-25 MG tablet Take 1 tablet by mouth daily. 11/02/16   [provider]  XARELTO 20 MG TABS tablet  01/08/18   [provider]     Vital Signs: BP (!) 121/44    Pulse 64    Temp 99.3 F (37.4 C) (Axillary)    Resp 17    Ht 5' (1.524 m)    Wt 112 lb 10.5 oz (51.1 kg)    SpO2 100%    BMI 22.00 kg/m   Physical Exam  Intubated, unresponsive.  Skin: R groin puncture intact.  No evidence of bleeding, hematoma or pseudoaneurysm Neuro: Does not open eyes.  Does not follow commands.  No sedation infusing currently.  Spontaneous movement on left side in wrist and foot.  Spasticity of the left hand (baseline?)  Imaging: Ct Angio Head W Or Wo Contrast  Result Date: 11/05/2018 CLINICAL DATA:  Stroke syndrome EXAM: CT ANGIOGRAPHY HEAD AND NECK CT PERFUSION BRAIN TECHNIQUE: Multidetector CT imaging of the head and neck was performed using the standard protocol  during bolus administration of intravenous contrast. Multiplanar CT image reconstructions and MIPs were obtained to evaluate the vascular anatomy. Carotid stenosis measurements (when applicable) are obtained utilizing NASCET criteria, using the distal internal carotid diameter as the denominator. Multiphase CT imaging of the brain was performed following IV bolus contrast injection. Subsequent parametric perfusion maps were calculated using RAPID software. CONTRAST:  154mL OMNIPAQUE IOHEXOL 350 MG/ML SOLN COMPARISON:  Noncontrast CT from earlier today FINDINGS: CTA NECK FINDINGS Aortic arch: Atherosclerotic plaque.  Two vessel branching. Right carotid system: Limited atheromatous changes for age. No stenosis. Subtle mid ICA beading. Left carotid system: Limited atheromatous changes for age. No ulceration or stenosis. There is mid ICA beading. Vertebral arteries: Left subclavian origin bulky calcified plaque without flow limiting stenosis. Both vertebral arteries are widely patent to the dura with distal bilateral vertebral beading, better seen on the right. Skeleton: No acute or aggressive finding.  Degenerative disease. Other neck: Negative for mass or swelling Upper chest: Partial coverage of the heart shows a left atrial appendage that is incompletely opacified Review of the MIP images confirms the above findings CTA HEAD FINDINGS Anterior circulation: Abrupt cut off at the left M1 segment extending into the M2 segments. There is downstream reconstitution. Atherosclerotic plaque on the carotid siphons. There is a moderate narrowing at the right supraclinoid ICA segment. Hypoplastic right A1 segment. Posterior circulation: Vertebral and basilar  arteries are smooth and widely patent. No branch occlusion or proximal flow limiting stenosis. Venous sinuses: Patent Anatomic variants: As above Delayed phase: Not obtained Review of the MIP images confirms the above findings CT Brain Perfusion Findings: ASPECTS: 10 CBF  (<30%) Volume: 22mL Perfusion (Tmax>6.0s) volume: 126mL-overestimated given there is artifactual signal along the bilateral cerebral convexities. Mismatch Volume: 138mL These results were called by telephone at the time of interpretation on 11/05/2018 at 1:13 pm to Dr. Milton Ferguson , who verbally acknowledged these results. IMPRESSION: 1. Emergent large vessel occlusion with embolism obstructing the left M1 segment. 2. No infarct by CT perfusion or prior noncontrast head CT. There penumbra throughout the left MCA territory (exact volume overestimated by motion artifact). 3. Non opacified left atrial appendage, question underlying atrial fibrillation. 4. Fibromuscular dysplasia in the neck without superimposed ulceration or stenosis. 5. Atherosclerosis is overall mild for age, although there is a notable moderate right supraclinoid ICA stenosis. Electronically Signed   By: Monte Fantasia M.D.   On: 11/05/2018 13:20   Ct Angio Neck W And/or Wo Contrast  Result Date: 11/05/2018 CLINICAL DATA:  Stroke syndrome EXAM: CT ANGIOGRAPHY HEAD AND NECK CT PERFUSION BRAIN TECHNIQUE: Multidetector CT imaging of the head and neck was performed using the standard protocol during bolus administration of intravenous contrast. Multiplanar CT image reconstructions and MIPs were obtained to evaluate the vascular anatomy. Carotid stenosis measurements (when applicable) are obtained utilizing NASCET criteria, using the distal internal carotid diameter as the denominator. Multiphase CT imaging of the brain was performed following IV bolus contrast injection. Subsequent parametric perfusion maps were calculated using RAPID software. CONTRAST:  175mL OMNIPAQUE IOHEXOL 350 MG/ML SOLN COMPARISON:  Noncontrast CT from earlier today FINDINGS: CTA NECK FINDINGS Aortic arch: Atherosclerotic plaque.  Two vessel branching. Right carotid system: Limited atheromatous changes for age. No stenosis. Subtle mid ICA beading. Left carotid system: Limited  atheromatous changes for age. No ulceration or stenosis. There is mid ICA beading. Vertebral arteries: Left subclavian origin bulky calcified plaque without flow limiting stenosis. Both vertebral arteries are widely patent to the dura with distal bilateral vertebral beading, better seen on the right. Skeleton: No acute or aggressive finding.  Degenerative disease. Other neck: Negative for mass or swelling Upper chest: Partial coverage of the heart shows a left atrial appendage that is incompletely opacified Review of the MIP images confirms the above findings CTA HEAD FINDINGS Anterior circulation: Abrupt cut off at the left M1 segment extending into the M2 segments. There is downstream reconstitution. Atherosclerotic plaque on the carotid siphons. There is a moderate narrowing at the right supraclinoid ICA segment. Hypoplastic right A1 segment. Posterior circulation: Vertebral and basilar arteries are smooth and widely patent. No branch occlusion or proximal flow limiting stenosis. Venous sinuses: Patent Anatomic variants: As above Delayed phase: Not obtained Review of the MIP images confirms the above findings CT Brain Perfusion Findings: ASPECTS: 10 CBF (<30%) Volume: 103mL Perfusion (Tmax>6.0s) volume: 135mL-overestimated given there is artifactual signal along the bilateral cerebral convexities. Mismatch Volume: 160mL These results were called by telephone at the time of interpretation on 11/05/2018 at 1:13 pm to Dr. Milton Ferguson , who verbally acknowledged these results. IMPRESSION: 1. Emergent large vessel occlusion with embolism obstructing the left M1 segment. 2. No infarct by CT perfusion or prior noncontrast head CT. There penumbra throughout the left MCA territory (exact volume overestimated by motion artifact). 3. Non opacified left atrial appendage, question underlying atrial fibrillation. 4. Fibromuscular dysplasia in the neck without superimposed  ulceration or stenosis. 5. Atherosclerosis is overall  mild for age, although there is a notable moderate right supraclinoid ICA stenosis. Electronically Signed   By: Monte Fantasia M.D.   On: 11/05/2018 13:20   Mr Brain Wo Contrast  Result Date: 11/06/2018 CLINICAL DATA:  Follow-up examination for acute stroke. EXAM: MRI HEAD WITHOUT CONTRAST TECHNIQUE: Multiplanar, multiecho pulse sequences of the brain and surrounding structures were obtained without intravenous contrast. COMPARISON:  Prior CT and CTA from 11/05/2018. FINDINGS: Brain: Diffuse prominence of the CSF containing spaces compatible generalized age-related cerebral atrophy. Patchy and confluent T2/FLAIR hyperintensity within the periventricular deep white matter both cerebral hemispheres most consistent with chronic small vessel ischemic disease, mild for age. Remote lacunar infarct noted at the right basal ganglia. Confluent restricted diffusion seen involving the left basal ganglia, with extension into the sub insular white matter, overlying insular cortex, as well as the adjacent left temporal lobe and left frontal operculum, compatible with evolving acute ischemic left MCA territory infarct. No associated hemorrhage or mass effect. No other evidence for acute or subacute ischemia. Gray-white matter differentiation otherwise maintained. No other areas of remote cortical infarction. No evidence for acute or chronic intracranial hemorrhage. No mass lesion, midline shift or mass effect. Mild ventricular prominence related to global parenchymal volume loss of hydrocephalus. No extra-axial fluid collection. Pituitary gland mildly prominent with convex border superiorly but grossly within normal limits. Midline structures intact. Vascular: Diminished/absent flow void within the distal left MCA branches, likely occluded (series 10, image 10). Major intracranial vascular flow voids otherwise maintained. Skull and upper cervical spine: Craniocervical junction within normal limits. Advanced degenerative  spondylolysis noted within the upper cervical spine with resultant mild-to-moderate spinal stenosis at C4-5. Bone marrow signal intensity within normal limits. No scalp soft tissue abnormality. Sinuses/Orbits: Patient status post bilateral ocular lens replacement. Scattered mucosal thickening throughout the paranasal sinuses. No air-fluid level to suggest acute sinusitis. No significant mastoid effusion. Inner ear structures normal. Other: None. IMPRESSION: 1. Moderate-sized evolving acute ischemic left MCA territory infarct involving the left basal ganglia and left frontotemporal region. No associated hemorrhage or significant regional mass effect. 2. Absent flow void within the distal left MCA branches, likely occluded. 3. Underlying age-related cerebral atrophy with mild chronic small vessel ischemic disease and chronic right basal ganglia lacunar infarct. Electronically Signed   By: Jeannine Boga M.D.   On: 11/06/2018 02:08   Ct Cerebral Perfusion W Contrast  Result Date: 11/05/2018 CLINICAL DATA:  Stroke syndrome EXAM: CT ANGIOGRAPHY HEAD AND NECK CT PERFUSION BRAIN TECHNIQUE: Multidetector CT imaging of the head and neck was performed using the standard protocol during bolus administration of intravenous contrast. Multiplanar CT image reconstructions and MIPs were obtained to evaluate the vascular anatomy. Carotid stenosis measurements (when applicable) are obtained utilizing NASCET criteria, using the distal internal carotid diameter as the denominator. Multiphase CT imaging of the brain was performed following IV bolus contrast injection. Subsequent parametric perfusion maps were calculated using RAPID software. CONTRAST:  151mL OMNIPAQUE IOHEXOL 350 MG/ML SOLN COMPARISON:  Noncontrast CT from earlier today FINDINGS: CTA NECK FINDINGS Aortic arch: Atherosclerotic plaque.  Two vessel branching. Right carotid system: Limited atheromatous changes for age. No stenosis. Subtle mid ICA beading. Left  carotid system: Limited atheromatous changes for age. No ulceration or stenosis. There is mid ICA beading. Vertebral arteries: Left subclavian origin bulky calcified plaque without flow limiting stenosis. Both vertebral arteries are widely patent to the dura with distal bilateral vertebral beading, better seen on the right.  Skeleton: No acute or aggressive finding.  Degenerative disease. Other neck: Negative for mass or swelling Upper chest: Partial coverage of the heart shows a left atrial appendage that is incompletely opacified Review of the MIP images confirms the above findings CTA HEAD FINDINGS Anterior circulation: Abrupt cut off at the left M1 segment extending into the M2 segments. There is downstream reconstitution. Atherosclerotic plaque on the carotid siphons. There is a moderate narrowing at the right supraclinoid ICA segment. Hypoplastic right A1 segment. Posterior circulation: Vertebral and basilar arteries are smooth and widely patent. No branch occlusion or proximal flow limiting stenosis. Venous sinuses: Patent Anatomic variants: As above Delayed phase: Not obtained Review of the MIP images confirms the above findings CT Brain Perfusion Findings: ASPECTS: 10 CBF (<30%) Volume: 15mL Perfusion (Tmax>6.0s) volume: 121mL-overestimated given there is artifactual signal along the bilateral cerebral convexities. Mismatch Volume: 187mL These results were called by telephone at the time of interpretation on 11/05/2018 at 1:13 pm to Dr. Milton Ferguson , who verbally acknowledged these results. IMPRESSION: 1. Emergent large vessel occlusion with embolism obstructing the left M1 segment. 2. No infarct by CT perfusion or prior noncontrast head CT. There penumbra throughout the left MCA territory (exact volume overestimated by motion artifact). 3. Non opacified left atrial appendage, question underlying atrial fibrillation. 4. Fibromuscular dysplasia in the neck without superimposed ulceration or stenosis. 5.  Atherosclerosis is overall mild for age, although there is a notable moderate right supraclinoid ICA stenosis. Electronically Signed   By: Monte Fantasia M.D.   On: 11/05/2018 13:20   Dg Chest Port 1 View  Result Date: 11/05/2018 CLINICAL DATA:  Acute respiratory failure EXAM: PORTABLE CHEST 1 VIEW COMPARISON:  11/14/2016 FINDINGS: Cardiac shadow is stable. Aortic calcifications are noted. Endotracheal tube is seen approximately 12 mm above the carina. This should be withdrawn 1-2 cm. The lungs are clear bilaterally. No focal infiltrate or sizable effusion is seen. IMPRESSION: Endotracheal tube 12 mm above the carina and should be withdrawn 1-2 cm. No focal infiltrate or effusion is seen. Electronically Signed   By: Inez Catalina M.D.   On: 11/05/2018 18:22   Dg Abd Portable 1v  Result Date: 11/06/2018 CLINICAL DATA:  Orogastric tube placement. EXAM: PORTABLE ABDOMEN - 1 VIEW COMPARISON:  None. FINDINGS: The bowel gas pattern is normal. Distal tip of enteric tube is seen in expected position of gastroesophageal junction. No radio-opaque calculi or other significant radiographic abnormality are seen. IMPRESSION: Distal tip of enteric tube is seen in expected position of gastroesophageal junction; advancement is recommended. No evidence of bowel obstruction or ileus. Electronically Signed   By: Marijo Conception M.D.   On: 11/06/2018 10:57   Ct Head Code Stroke Wo Contrast  Result Date: 11/05/2018 CLINICAL DATA:  Code stroke.  Unresponsive beginning 20 minutes ago. EXAM: CT HEAD WITHOUT CONTRAST TECHNIQUE: Contiguous axial images were obtained from the base of the skull through the vertex without intravenous contrast. COMPARISON:  None. FINDINGS: Brain: No evidence of acute infarction, hemorrhage, hydrocephalus, extra-axial collection or mass lesion/mass effect. Remote right lateral lenticulostriate infarct affecting the basal ganglia and internal capsule. Cerebral volume loss in keeping with age Vascular:  Hyperdense left M1 segment extending into the M2 branches. Skull: Normal. Negative for fracture or focal lesion. Sinuses/Orbits: Negative Other: Critical Value/emergent results were called by telephone at the time of interpretation on 11/05/2018 at 12:36 pm to Dr. Milton Ferguson , who verbally acknowledged these results. ASPECTS [facility] Stroke Program Early CT Score) - Ganglionic level  infarction (caudate, lentiform nuclei, internal capsule, insula, M1-M3 cortex): 7 - Supraganglionic infarction (M4-M6 cortex): 3 Total score (0-10 with 10 being normal): 10 IMPRESSION: 1. Hyperdense left MCA suggesting large vessel embolism. No hemorrhage or visible infarct. ASPECTS is 10. 2. Remote right lateral lenticulostriate infarct. Electronically Signed   By: Monte Fantasia M.D.   On: 11/05/2018 12:38    Labs:  CBC: Recent Labs    07/17/18 1003 10/07/18 1057 11/05/18 1223 11/05/18 1320 11/05/18 1805 11/06/18 0430  WBC 4.3 5.8 5.0  --   --  6.7  HGB 10.4* 10.4* 9.6* 11.6* 8.8* 8.1*  HCT 34.8* 34.1* 31.6* 34.0* 26.0* 26.0*  PLT 174 206 186  --   --  160    COAGS: Recent Labs    11/05/18 1223  INR 1.0  APTT 31    BMP: Recent Labs    07/17/18 1003 10/07/18 1057 11/05/18 1223 11/05/18 1320 11/05/18 1805 11/06/18 0430  NA 141 141 141 140 142 139  K 4.3 4.1 4.0 5.0 3.6 3.9  CL 108 106 107 105  --  112*  CO2 26 25 26   --   --  19*  GLUCOSE 78 88 92 86  --  151*  BUN 41* 29* 40* 52*  --  30*  CALCIUM 9.3 9.1 9.3  --   --  8.0*  CREATININE 1.26* 1.17* 1.32* 1.20*  --  1.30*  GFRNONAA 36* 40* 34*  --   --  35*  GFRAA 42* 46* 40*  --   --  41*    LIVER FUNCTION TESTS: Recent Labs    01/16/18 1243 07/17/18 1003 10/07/18 1057 11/05/18 1223  BILITOT 0.4 0.4 0.6 0.3  AST 19 18 18 21   ALT 10 9 12 13   ALKPHOS 68 56 59 65  PROT 7.2 7.5 7.8 7.4  ALBUMIN 3.7 3.9 4.0 3.5    Assessment and Plan: Left MCA CVA Patient s/p stroke yesterday afternoon. Previously on Eliquis due to history of  DVT.  Underwent angiogram with attempt at recanalization and angioplasty of occluded L MCA, however reocclusion occurred and flow not restored.  She remains intubated today.  Poorly responsive, spontaneous movement on left-side only.  Per RN, family working towards terminal extubation/comfort care. Groin intact.  NIR following.   Electronically Signed: Docia Barrier, PA 11/06/2018, 1:06 PM   I spent a total of 15 Minutes at the the patient's bedside AND on the patient's hospital floor or unit, greater than 50% of which was counseling/coordinating care for MCA CVA.

## 2018-11-06 NOTE — Progress Notes (Signed)
PT Cancellation Note  Patient Details Name: Angela Higgins MRN: [id number] DOB: 1921/06/23   Cancelled Treatment:    Reason Eval/Treat Not Completed: Patient not medically ready (remains intubated s/p procedure).  Ellamae Sia, PT, DPT Acute Rehabilitation Services Pager [phone] Office [phone]    Willy Eddy 11/06/2018, 7:53 AM

## 2018-11-06 NOTE — Progress Notes (Signed)
STROKE TEAM PROGRESS NOTE   INTERVAL HISTORY I have personally reviewed HPI and electronic medical records and imaging films in PACS.  She presented with left M1 occlusion with aphasia and right hemiplegia and underwent mechanical thrombectomy with TICI 2b partial recanalization.  She remains intubated.  She is off sedation overnight but remains globally aphasic and not following any commands with dense right hemiplegia.  Blood pressure adequately controlled.  MRI scan shows a moderate size left MCA infarct involving temporal lobe, insular cortex and basal ganglia Vitals:   11/06/18 0400 11/06/18 0500 11/06/18 0600 11/06/18 0800  BP: (!) 120/56 (!) 115/54 127/62 (!) 94/26  Pulse:  (!) 112 (!) 112 60  Resp: 16 16 16 19   Temp: 98 F (36.7 C)   99.8 F (37.7 C)  TempSrc: Axillary   Axillary  SpO2:  100% 100% 100%  Weight:      Height:        CBC:  Recent Labs  Lab 11/05/18 1223  11/05/18 1805 11/06/18 0430  WBC 5.0  --   --  6.7  NEUTROABS 1.9  --   --  5.8  HGB 9.6*   < > 8.8* 8.1*  HCT 31.6*   < > 26.0* 26.0*  MCV 91.3  --   --  89.7  PLT 186  --   --  160   < > = values in this interval not displayed.    Basic Metabolic Panel:  Recent Labs  Lab 11/05/18 1223 11/05/18 1320 11/05/18 1805 11/06/18 0430  NA 141 140 142 139  K 4.0 5.0 3.6 3.9  CL 107 105  --  112*  CO2 26  --   --  19*  GLUCOSE 92 86  --  151*  BUN 40* 52*  --  30*  CREATININE 1.32* 1.20*  --  1.30*  CALCIUM 9.3  --   --  8.0*   Lipid Panel:     Component Value Date/Time   CHOL 188 11/06/2018 0430   TRIG 75 11/06/2018 0430   HDL 63 11/06/2018 0430   CHOLHDL 3.0 11/06/2018 0430   VLDL 15 11/06/2018 0430   LDLCALC 110 (H) 11/06/2018 0430   HgbA1c: No results found for: HGBA1C Urine Drug Screen:     Component Value Date/Time   LABOPIA NONE DETECTED 11/05/2018 2024   COCAINSCRNUR NONE DETECTED 11/05/2018 2024   LABBENZ NONE DETECTED 11/05/2018 2024   AMPHETMU NONE DETECTED 11/05/2018 2024    THCU NONE DETECTED 11/05/2018 2024   LABBARB NONE DETECTED 11/05/2018 2024    Alcohol Level     Component Value Date/Time   ETH <10 11/05/2018 1223    IMAGING Ct Head Code Stroke Wo Contrast 11/05/2018 1238 1. Hyperdense left MCA suggesting large vessel embolism. No hemorrhage or visible infarct. ASPECTS is 10. 2. Remote right lateral lenticulostriate infarct.   Ct Angio Head W Or Wo Contrast Ct Angio Neck W And/or Wo Contrast Ct Cerebral Perfusion W Contrast 11/05/2018 1320 1. Emergent large vessel occlusion with embolism obstructing the left M1 segment. 2. No infarct by CT perfusion or prior noncontrast head CT. There penumbra throughout the left MCA territory (exact volume overestimated by motion artifact). 3. Non opacified left atrial appendage, question underlying atrial fibrillation. 4. Fibromuscular dysplasia in the neck without superimposed ulceration or stenosis. 5. Atherosclerosis is overall mild for age, although there is a notable moderate right supraclinoid ICA stenosis.   Cerebral angiogram 11/05/2018 S/P Lt common carotid arteriogram followed by endovascular revascularization of  occluded Lt MCa M 1 seg with x 2 passes with 21mm x 33 mm embotrap retriever device and x 1 pass with 32mm x 40 mm solitaireX retriever device achieving a TICI 2 B revascularization with reocclusion due to  underlying severe ICAD..  Mr Brain Wo Contrast 11/06/2018 0208 1. Moderate-sized evolving acute ischemic left MCA territory infarct involving the left basal ganglia and left frontotemporal region. No associated hemorrhage or significant regional mass effect. 2. Absent flow void within the distal left MCA branches, likely occluded. 3. Underlying age-related cerebral atrophy with mild chronic small vessel ischemic disease and chronic right basal ganglia lacunar infarct.    Dg Chest Port 1 View 11/05/2018 Endotracheal tube 12 mm above the carina and should be withdrawn 1-2 cm. No focal infiltrate or effusion  is seen. Sent to IR with partial recanalization.   PHYSICAL EXAM Frail elderly African-American lady whose intubated not sedated but unresponsive.   . Afebrile. Head is nontraumatic. Neck is supple without bruit.    Cardiac exam no murmur or gallop. Lungs are clear to auscultation. Distal pulses are well felt. Neurological Exam :  Patient is unresponsive.  She is on ventilator.  Eyes are closed.  She is globally aphasic and does not open eyes or follow any commands.  She has left gaze preference.  She will move eyes partially to the right with doll's eye movements.  Pupils are both irregular and reactive.  Fundi not visualized.  She had dense right hemiplegia with only trace withdrawal in the right lower extremity to painful stimuli.  She has purposeful antigravity movements on the left side.  Left hand has spasticity and fixed flexion contractures of the left hand fingers ASSESSMENT/PLAN [name] is a 85 y.o. female with history of DVT on Xarelto presenting to [facility] with sudden onset R sided weakness and aphasia after a sneeze. CTA showed L M1 occlusion.  Stroke:   L MCA infarct s/p IR with partial recanalization secondary to large vessel disease source  Code Stroke CT head No infarct. Hyperdense L MCA. Old R lateral lenticulostriate infarct. ASPECTS 10.     CTA head & neck ELVO L M1. Non opacified LA appendage, ? AF. FMD neck. Mild atherosclerosis.  CT perfusion L MCA penumbra.   Cerebral angio occluded L M1, TICI2b revascularization with 2 passes embotrap and 1 pass solitaire. Underlying ICAD.  Post IR CT no ICH  MRI  Mod L MCA infarct (L BG, L frontotemporal). Absent flow L MCA branches. Small vessel disease. Atrophy. Old R BG lacune.  2D Echo pending   LDL 110  HgbA1c pending  SCDs for VTE prophylaxis  aspirin 81 mg daily and Xarelto (rivaroxaban) daily prior to admission, now on aspirin 300 mg suppository daily.    Therapy recommendations:  pending    Disposition:  pending   Acute Respiratory Failure, unable to protect airway  Intubated for IR  Remains intubated this am  CCM on board  Bradycardia  HR 35-120s-suspect sick sinus syndrome  Hypertension  Home meds:  Amlodipine 5, lisinopril-HCTZ 20-25 . Treated with Cleviprex BP per IR x 24h post IR . Long-term BP goal normotensive  Hyperlipidemia  Home meds:  No statin  LDL 110, goal < 70  Add Lipitor 40 mg daily  Continue statin at discharge  Dysphagia . Secondary to stroke . NPO . 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  She presented with left M1 occlusion with global aphasia and right hemiplegia and underwent mechanical thrombectomy with partial recanalization but neurological exam as well as MRI scan showed moderate large left MCA infarct with significant global aphasia and right hemiplegia.  Continue ventilatory support for respiratory failure and close neurological monitoring.  I spoke to the patient's sister and niece over the phone and explained to them guarded prognosis patient will likely need prolonged ventilatory support and nursing home care.  Family feels she may not have wanted this but are cautiously optimistic and would like to continue supportive care for the next 1 to 2 days before they make a final decision about goals of care.  Discussed with Dr. Lynetta Mare critical care medicine. This patient is critically ill and at  significant risk of neurological worsening, death and care requires constant monitoring of vital signs, hemodynamics,respiratory and cardiac monitoring, extensive review of multiple databases, frequent neurological assessment, discussion with family, other specialists and medical decision making of high complexity.I have made any additions or clarifications directly to the above note.This critical care time does not reflect procedure time, or teaching time or supervisory time of PA/NP/Med Resident etc but could involve care discussion time.  I spent 40 minutes of neurocritical care time  in the care of  this patient.      Antony Contras, MD Medical Director [place] Pager: [phone] 11/06/2018 12:56 PM   To contact Stroke Continuity provider, please refer to [email or web address] After hours, contact General Neurology

## 2018-11-06 NOTE — Progress Notes (Signed)
SLP Cancellation Note  Patient Details Name: Angela Higgins MRN: [id number] DOB: [birth year]   Cancelled treatment:       Reason Eval/Treat Not Completed: Patient not medically ready. Will follow for readiness for cognitive linguistic eval.    Cashlynn Yearwood, Katherene Ponto 11/06/2018, 7:42 AM

## 2018-11-07 ENCOUNTER — Inpatient Hospital Stay (HOSPITAL_COMMUNITY): Payer: Medicare HMO

## 2018-11-07 LAB — CBC WITH DIFFERENTIAL/PLATELET
Abs Immature Granulocytes: 0.06 10*3/uL (ref 0.00–0.07)
Basophils Absolute: 0 10*3/uL (ref 0.0–0.1)
Basophils Relative: 0 %
Eosinophils Absolute: 0.1 10*3/uL (ref 0.0–0.5)
Eosinophils Relative: 2 %
HCT: 22.1 % — ABNORMAL LOW (ref 36.0–46.0)
Hemoglobin: 6.9 g/dL — CL (ref 12.0–15.0)
Immature Granulocytes: 1 %
Lymphocytes Relative: 10 %
Lymphs Abs: 0.6 10*3/uL — ABNORMAL LOW (ref 0.7–4.0)
MCH: 27.7 pg (ref 26.0–34.0)
MCHC: 31.2 g/dL (ref 30.0–36.0)
MCV: 88.8 fL (ref 80.0–100.0)
Monocytes Absolute: 0.4 10*3/uL (ref 0.1–1.0)
Monocytes Relative: 7 %
Neutro Abs: 5 10*3/uL (ref 1.7–7.7)
Neutrophils Relative %: 80 %
Platelets: 129 10*3/uL — ABNORMAL LOW (ref 150–400)
RBC: 2.49 MIL/uL — ABNORMAL LOW (ref 3.87–5.11)
RDW: 15.8 % — ABNORMAL HIGH (ref 11.5–15.5)
WBC: 6.2 10*3/uL (ref 4.0–10.5)
nRBC: 0 % (ref 0.0–0.2)

## 2018-11-07 LAB — BASIC METABOLIC PANEL
Anion gap: 10 (ref 5–15)
BUN: 19 mg/dL (ref 8–23)
CO2: 17 mmol/L — ABNORMAL LOW (ref 22–32)
Calcium: 7.6 mg/dL — ABNORMAL LOW (ref 8.9–10.3)
Chloride: 116 mmol/L — ABNORMAL HIGH (ref 98–111)
Creatinine, Ser: 1.15 mg/dL — ABNORMAL HIGH (ref 0.44–1.00)
GFR calc Af Amer: 47 mL/min — ABNORMAL LOW (ref >60)
GFR calc non Af Amer: 41 mL/min — ABNORMAL LOW (ref >60)
Glucose, Bld: 104 mg/dL — ABNORMAL HIGH (ref 70–99)
Potassium: 3.6 mmol/L (ref 3.5–5.1)
Sodium: 143 mmol/L (ref 135–145)

## 2018-11-07 LAB — PHOSPHORUS: Phosphorus: 2.6 mg/dL (ref 2.5–4.6)

## 2018-11-07 LAB — MAGNESIUM: Magnesium: 1.6 mg/dL — ABNORMAL LOW (ref 1.7–2.4)

## 2018-11-07 LAB — ABO/RH: ABO/RH(D): O POS

## 2018-11-07 LAB — HEMOGLOBIN A1C
Hgb A1c MFr Bld: 6.2 % — ABNORMAL HIGH (ref 4.8–5.6)
Mean Plasma Glucose: 131 mg/dL

## 2018-11-07 LAB — PREPARE RBC (CROSSMATCH)

## 2018-11-07 MED ORDER — SODIUM CHLORIDE 0.9% IV SOLUTION: Freq: Once | INTRAVENOUS | Status: DC

## 2018-11-07 MED ORDER — CHLORHEXIDINE GLUCONATE CLOTH 2 % EX PADS
6.0000 | MEDICATED_PAD | Freq: Every day | CUTANEOUS | Status: DC
  Administered 2018-11-08 – 2018-11-09 (×2): 6 via TOPICAL

## 2018-11-07 MED ORDER — LORAZEPAM 2 MG/ML IJ SOLN
INTRAMUSCULAR | Status: AC
  Filled 2018-11-07: qty 1

## 2018-11-07 MED ORDER — LORAZEPAM 2 MG/ML IJ SOLN
1.0000 mg | Freq: Once | INTRAMUSCULAR | Status: AC
  Administered 2018-11-07: 1 mg via INTRAVENOUS

## 2018-11-07 NOTE — Progress Notes (Signed)
Referring Physician(s): Code Stroke- Greta Doom  Supervising Physician: Luanne Bras  Patient Status:  [facility] - In-pt  Chief Complaint: None  Subjective:  Left MCA M1 segment occlusion s/p emergent mechanical thrombectomy achieving a TICI 2b revascularization 11/05/2018 by Dr. Estanislado Pandy. Patient laying in bed intubated with sedation. Poorly responsive- she does not respond to voice or painful stimuli. Some spontaneous movements of left side. Eyes conjugated to right. Right groin incision c/d/i.  MR brain 11/06/2018: 1. Moderate-sized evolving acute ischemic left MCA territory infarct involving the left basal ganglia and left frontotemporal region. No associated hemorrhage or significant regional mass effect. 2. Absent flow void within the distal left MCA branches, likely occluded. 3. Underlying age-related cerebral atrophy with mild chronic small vessel ischemic disease and chronic right basal ganglia lacunar infarct.   Allergies: Patient has no known allergies.  Medications: Prior to Admission medications   Medication Sig Start Date End Date Taking? Authorizing Provider  amLODipine (NORVASC) 5 MG tablet Take 5 mg by mouth daily.  01/05/16   [provider]  anastrozole (ARIMIDEX) 1 MG tablet Take 1 tablet (1 mg total) by mouth daily. 07/18/17   Holley Bouche, NP  anastrozole (ARIMIDEX) 1 MG tablet Take 1 tablet (1 mg total) by mouth daily. 10/09/18   Roger Shelter, FNP  aspirin EC 81 MG tablet Take 81 mg by mouth at bedtime.    [provider]  baclofen (LIORESAL) 10 MG tablet Take 10 mg by mouth 2 (two) times daily as needed for muscle spasms (cramps).    [provider]  lisinopril-hydrochlorothiazide (PRINZIDE,ZESTORETIC) 20-25 MG tablet Take 1 tablet by mouth daily. 11/02/16   [provider]  XARELTO 20 MG TABS tablet  01/08/18   [provider]     Vital Signs: BP (!) 144/55    Pulse 64    Temp 99.1 F (37.3  C) (Axillary)    Resp 16    Ht 5' (1.524 m)    Wt 112 lb 10.5 oz (51.1 kg)    SpO2 100%    BMI 22.00 kg/m   Physical Exam Vitals signs and nursing note reviewed.  Constitutional:      General: She is not in acute distress.    Appearance: Normal appearance.     Comments: Intubated with sedation.  Pulmonary:     Effort: Pulmonary effort is normal. No respiratory distress.     Comments: Intubated with sedation. Skin:    General: Skin is warm and dry.     Comments: Right groin incision soft without active bleeding or hematoma.  Neurological:     Comments: Intubated with sedation, poorly responsive. PERRL, eyes conjugate to right. Some spontaneous movements of left side only. Distal pulses 2+ bilaterally.  Psychiatric:     Comments: Intubated with sedation.     Imaging: Ct Angio Head W Or Wo Contrast  Result Date: 11/05/2018 CLINICAL DATA:  Stroke syndrome EXAM: CT ANGIOGRAPHY HEAD AND NECK CT PERFUSION BRAIN TECHNIQUE: Multidetector CT imaging of the head and neck was performed using the standard protocol during bolus administration of intravenous contrast. Multiplanar CT image reconstructions and MIPs were obtained to evaluate the vascular anatomy. Carotid stenosis measurements (when applicable) are obtained utilizing NASCET criteria, using the distal internal carotid diameter as the denominator. Multiphase CT imaging of the brain was performed following IV bolus contrast injection. Subsequent parametric perfusion maps were calculated using RAPID software. CONTRAST:  151mL OMNIPAQUE IOHEXOL 350 MG/ML SOLN COMPARISON:  Noncontrast CT  from earlier today FINDINGS: CTA NECK FINDINGS Aortic arch: Atherosclerotic plaque.  Two vessel branching. Right carotid system: Limited atheromatous changes for age. No stenosis. Subtle mid ICA beading. Left carotid system: Limited atheromatous changes for age. No ulceration or stenosis. There is mid ICA beading. Vertebral arteries: Left subclavian origin bulky  calcified plaque without flow limiting stenosis. Both vertebral arteries are widely patent to the dura with distal bilateral vertebral beading, better seen on the right. Skeleton: No acute or aggressive finding.  Degenerative disease. Other neck: Negative for mass or swelling Upper chest: Partial coverage of the heart shows a left atrial appendage that is incompletely opacified Review of the MIP images confirms the above findings CTA HEAD FINDINGS Anterior circulation: Abrupt cut off at the left M1 segment extending into the M2 segments. There is downstream reconstitution. Atherosclerotic plaque on the carotid siphons. There is a moderate narrowing at the right supraclinoid ICA segment. Hypoplastic right A1 segment. Posterior circulation: Vertebral and basilar arteries are smooth and widely patent. No branch occlusion or proximal flow limiting stenosis. Venous sinuses: Patent Anatomic variants: As above Delayed phase: Not obtained Review of the MIP images confirms the above findings CT Brain Perfusion Findings: ASPECTS: 10 CBF (<30%) Volume: 84mL Perfusion (Tmax>6.0s) volume: 171mL-overestimated given there is artifactual signal along the bilateral cerebral convexities. Mismatch Volume: 145mL These results were called by telephone at the time of interpretation on 11/05/2018 at 1:13 pm to Dr. Milton Ferguson , who verbally acknowledged these results. IMPRESSION: 1. Emergent large vessel occlusion with embolism obstructing the left M1 segment. 2. No infarct by CT perfusion or prior noncontrast head CT. There penumbra throughout the left MCA territory (exact volume overestimated by motion artifact). 3. Non opacified left atrial appendage, question underlying atrial fibrillation. 4. Fibromuscular dysplasia in the neck without superimposed ulceration or stenosis. 5. Atherosclerosis is overall mild for age, although there is a notable moderate right supraclinoid ICA stenosis. Electronically Signed   By: Monte Fantasia M.D.    On: 11/05/2018 13:20   Ct Angio Neck W And/or Wo Contrast  Result Date: 11/05/2018 CLINICAL DATA:  Stroke syndrome EXAM: CT ANGIOGRAPHY HEAD AND NECK CT PERFUSION BRAIN TECHNIQUE: Multidetector CT imaging of the head and neck was performed using the standard protocol during bolus administration of intravenous contrast. Multiplanar CT image reconstructions and MIPs were obtained to evaluate the vascular anatomy. Carotid stenosis measurements (when applicable) are obtained utilizing NASCET criteria, using the distal internal carotid diameter as the denominator. Multiphase CT imaging of the brain was performed following IV bolus contrast injection. Subsequent parametric perfusion maps were calculated using RAPID software. CONTRAST:  141mL OMNIPAQUE IOHEXOL 350 MG/ML SOLN COMPARISON:  Noncontrast CT from earlier today FINDINGS: CTA NECK FINDINGS Aortic arch: Atherosclerotic plaque.  Two vessel branching. Right carotid system: Limited atheromatous changes for age. No stenosis. Subtle mid ICA beading. Left carotid system: Limited atheromatous changes for age. No ulceration or stenosis. There is mid ICA beading. Vertebral arteries: Left subclavian origin bulky calcified plaque without flow limiting stenosis. Both vertebral arteries are widely patent to the dura with distal bilateral vertebral beading, better seen on the right. Skeleton: No acute or aggressive finding.  Degenerative disease. Other neck: Negative for mass or swelling Upper chest: Partial coverage of the heart shows a left atrial appendage that is incompletely opacified Review of the MIP images confirms the above findings CTA HEAD FINDINGS Anterior circulation: Abrupt cut off at the left M1 segment extending into the M2 segments. There is downstream reconstitution. Atherosclerotic plaque  on the carotid siphons. There is a moderate narrowing at the right supraclinoid ICA segment. Hypoplastic right A1 segment. Posterior circulation: Vertebral and basilar  arteries are smooth and widely patent. No branch occlusion or proximal flow limiting stenosis. Venous sinuses: Patent Anatomic variants: As above Delayed phase: Not obtained Review of the MIP images confirms the above findings CT Brain Perfusion Findings: ASPECTS: 10 CBF (<30%) Volume: 10mL Perfusion (Tmax>6.0s) volume: 143mL-overestimated given there is artifactual signal along the bilateral cerebral convexities. Mismatch Volume: 149mL These results were called by telephone at the time of interpretation on 11/05/2018 at 1:13 pm to Dr. Milton Ferguson , who verbally acknowledged these results. IMPRESSION: 1. Emergent large vessel occlusion with embolism obstructing the left M1 segment. 2. No infarct by CT perfusion or prior noncontrast head CT. There penumbra throughout the left MCA territory (exact volume overestimated by motion artifact). 3. Non opacified left atrial appendage, question underlying atrial fibrillation. 4. Fibromuscular dysplasia in the neck without superimposed ulceration or stenosis. 5. Atherosclerosis is overall mild for age, although there is a notable moderate right supraclinoid ICA stenosis. Electronically Signed   By: Monte Fantasia M.D.   On: 11/05/2018 13:20   Mr Brain Wo Contrast  Result Date: 11/06/2018 CLINICAL DATA:  Follow-up examination for acute stroke. EXAM: MRI HEAD WITHOUT CONTRAST TECHNIQUE: Multiplanar, multiecho pulse sequences of the brain and surrounding structures were obtained without intravenous contrast. COMPARISON:  Prior CT and CTA from 11/05/2018. FINDINGS: Brain: Diffuse prominence of the CSF containing spaces compatible generalized age-related cerebral atrophy. Patchy and confluent T2/FLAIR hyperintensity within the periventricular deep white matter both cerebral hemispheres most consistent with chronic small vessel ischemic disease, mild for age. Remote lacunar infarct noted at the right basal ganglia. Confluent restricted diffusion seen involving the left basal  ganglia, with extension into the sub insular white matter, overlying insular cortex, as well as the adjacent left temporal lobe and left frontal operculum, compatible with evolving acute ischemic left MCA territory infarct. No associated hemorrhage or mass effect. No other evidence for acute or subacute ischemia. Gray-white matter differentiation otherwise maintained. No other areas of remote cortical infarction. No evidence for acute or chronic intracranial hemorrhage. No mass lesion, midline shift or mass effect. Mild ventricular prominence related to global parenchymal volume loss of hydrocephalus. No extra-axial fluid collection. Pituitary gland mildly prominent with convex border superiorly but grossly within normal limits. Midline structures intact. Vascular: Diminished/absent flow void within the distal left MCA branches, likely occluded (series 10, image 10). Major intracranial vascular flow voids otherwise maintained. Skull and upper cervical spine: Craniocervical junction within normal limits. Advanced degenerative spondylolysis noted within the upper cervical spine with resultant mild-to-moderate spinal stenosis at C4-5. Bone marrow signal intensity within normal limits. No scalp soft tissue abnormality. Sinuses/Orbits: Patient status post bilateral ocular lens replacement. Scattered mucosal thickening throughout the paranasal sinuses. No air-fluid level to suggest acute sinusitis. No significant mastoid effusion. Inner ear structures normal. Other: None. IMPRESSION: 1. Moderate-sized evolving acute ischemic left MCA territory infarct involving the left basal ganglia and left frontotemporal region. No associated hemorrhage or significant regional mass effect. 2. Absent flow void within the distal left MCA branches, likely occluded. 3. Underlying age-related cerebral atrophy with mild chronic small vessel ischemic disease and chronic right basal ganglia lacunar infarct. Electronically Signed   By: Jeannine Boga M.D.   On: 11/06/2018 02:08   Ct Cerebral Perfusion W Contrast  Result Date: 11/05/2018 CLINICAL DATA:  Stroke syndrome EXAM: CT ANGIOGRAPHY HEAD AND NECK CT  PERFUSION BRAIN TECHNIQUE: Multidetector CT imaging of the head and neck was performed using the standard protocol during bolus administration of intravenous contrast. Multiplanar CT image reconstructions and MIPs were obtained to evaluate the vascular anatomy. Carotid stenosis measurements (when applicable) are obtained utilizing NASCET criteria, using the distal internal carotid diameter as the denominator. Multiphase CT imaging of the brain was performed following IV bolus contrast injection. Subsequent parametric perfusion maps were calculated using RAPID software. CONTRAST:  135mL OMNIPAQUE IOHEXOL 350 MG/ML SOLN COMPARISON:  Noncontrast CT from earlier today FINDINGS: CTA NECK FINDINGS Aortic arch: Atherosclerotic plaque.  Two vessel branching. Right carotid system: Limited atheromatous changes for age. No stenosis. Subtle mid ICA beading. Left carotid system: Limited atheromatous changes for age. No ulceration or stenosis. There is mid ICA beading. Vertebral arteries: Left subclavian origin bulky calcified plaque without flow limiting stenosis. Both vertebral arteries are widely patent to the dura with distal bilateral vertebral beading, better seen on the right. Skeleton: No acute or aggressive finding.  Degenerative disease. Other neck: Negative for mass or swelling Upper chest: Partial coverage of the heart shows a left atrial appendage that is incompletely opacified Review of the MIP images confirms the above findings CTA HEAD FINDINGS Anterior circulation: Abrupt cut off at the left M1 segment extending into the M2 segments. There is downstream reconstitution. Atherosclerotic plaque on the carotid siphons. There is a moderate narrowing at the right supraclinoid ICA segment. Hypoplastic right A1 segment. Posterior circulation:  Vertebral and basilar arteries are smooth and widely patent. No branch occlusion or proximal flow limiting stenosis. Venous sinuses: Patent Anatomic variants: As above Delayed phase: Not obtained Review of the MIP images confirms the above findings CT Brain Perfusion Findings: ASPECTS: 10 CBF (<30%) Volume: 70mL Perfusion (Tmax>6.0s) volume: 153mL-overestimated given there is artifactual signal along the bilateral cerebral convexities. Mismatch Volume: 15mL These results were called by telephone at the time of interpretation on 11/05/2018 at 1:13 pm to Dr. Milton Ferguson , who verbally acknowledged these results. IMPRESSION: 1. Emergent large vessel occlusion with embolism obstructing the left M1 segment. 2. No infarct by CT perfusion or prior noncontrast head CT. There penumbra throughout the left MCA territory (exact volume overestimated by motion artifact). 3. Non opacified left atrial appendage, question underlying atrial fibrillation. 4. Fibromuscular dysplasia in the neck without superimposed ulceration or stenosis. 5. Atherosclerosis is overall mild for age, although there is a notable moderate right supraclinoid ICA stenosis. Electronically Signed   By: Monte Fantasia M.D.   On: 11/05/2018 13:20   Dg Chest Port 1 View  Result Date: 11/07/2018 CLINICAL DATA:  Respiratory failure. EXAM: PORTABLE CHEST 1 VIEW COMPARISON:  11/05/2018. FINDINGS: Endotracheal tube noted with tip 5 cm above the carina. NG tube tip in the upper most portion of the stomach. Advancement of approximately 10 cm should be considered. Bibasilar atelectasis. No pleural effusion or pneumothorax. Heart size stable. IMPRESSION: 1. Endotracheal tube noted with tip 5 cm above the carina. NG tube noted with tip in the upper most portion of the stomach. Advancement of the NG tube approximately 10 cm should be considered. 2.  Mild bibasilar atelectasis. Electronically Signed   By: Marcello Moores  Register   On: 11/07/2018 07:08   Dg Chest Port 1  View  Result Date: 11/05/2018 CLINICAL DATA:  Acute respiratory failure EXAM: PORTABLE CHEST 1 VIEW COMPARISON:  11/14/2016 FINDINGS: Cardiac shadow is stable. Aortic calcifications are noted. Endotracheal tube is seen approximately 12 mm above the carina. This should be withdrawn 1-2  cm. The lungs are clear bilaterally. No focal infiltrate or sizable effusion is seen. IMPRESSION: Endotracheal tube 12 mm above the carina and should be withdrawn 1-2 cm. No focal infiltrate or effusion is seen. Electronically Signed   By: Inez Catalina M.D.   On: 11/05/2018 18:22   Dg Abd Portable 1v  Result Date: 11/06/2018 CLINICAL DATA:  Orogastric tube placement. EXAM: PORTABLE ABDOMEN - 1 VIEW COMPARISON:  None. FINDINGS: The bowel gas pattern is normal. Distal tip of enteric tube is seen in expected position of gastroesophageal junction. No radio-opaque calculi or other significant radiographic abnormality are seen. IMPRESSION: Distal tip of enteric tube is seen in expected position of gastroesophageal junction; advancement is recommended. No evidence of bowel obstruction or ileus. Electronically Signed   By: Marijo Conception M.D.   On: 11/06/2018 10:57   Ct Head Code Stroke Wo Contrast  Result Date: 11/05/2018 CLINICAL DATA:  Code stroke.  Unresponsive beginning 20 minutes ago. EXAM: CT HEAD WITHOUT CONTRAST TECHNIQUE: Contiguous axial images were obtained from the base of the skull through the vertex without intravenous contrast. COMPARISON:  None. FINDINGS: Brain: No evidence of acute infarction, hemorrhage, hydrocephalus, extra-axial collection or mass lesion/mass effect. Remote right lateral lenticulostriate infarct affecting the basal ganglia and internal capsule. Cerebral volume loss in keeping with age Vascular: Hyperdense left M1 segment extending into the M2 branches. Skull: Normal. Negative for fracture or focal lesion. Sinuses/Orbits: Negative Other: Critical Value/emergent results were called by telephone at  the time of interpretation on 11/05/2018 at 12:36 pm to Dr. Milton Ferguson , who verbally acknowledged these results. ASPECTS [facility] Stroke Program Early CT Score) - Ganglionic level infarction (caudate, lentiform nuclei, internal capsule, insula, M1-M3 cortex): 7 - Supraganglionic infarction (M4-M6 cortex): 3 Total score (0-10 with 10 being normal): 10 IMPRESSION: 1. Hyperdense left MCA suggesting large vessel embolism. No hemorrhage or visible infarct. ASPECTS is 10. 2. Remote right lateral lenticulostriate infarct. Electronically Signed   By: Monte Fantasia M.D.   On: 11/05/2018 12:38    Labs:  CBC: Recent Labs    10/07/18 1057 11/05/18 1223 11/05/18 1320 11/05/18 1805 11/06/18 0430 11/07/18 0853  WBC 5.8 5.0  --   --  6.7 6.2  HGB 10.4* 9.6* 11.6* 8.8* 8.1* 6.9*  HCT 34.1* 31.6* 34.0* 26.0* 26.0* 22.1*  PLT 206 186  --   --  160 129*    COAGS: Recent Labs    11/05/18 1223  INR 1.0  APTT 31    BMP: Recent Labs    10/07/18 1057 11/05/18 1223 11/05/18 1320 11/05/18 1805 11/06/18 0430 11/07/18 0853  NA 141 141 140 142 139 143  K 4.1 4.0 5.0 3.6 3.9 3.6  CL 106 107 105  --  112* 116*  CO2 25 26  --   --  19* 17*  GLUCOSE 88 92 86  --  151* 104*  BUN 29* 40* 52*  --  30* 19  CALCIUM 9.1 9.3  --   --  8.0* 7.6*  CREATININE 1.17* 1.32* 1.20*  --  1.30* 1.15*  GFRNONAA 40* 34*  --   --  35* 41*  GFRAA 46* 40*  --   --  41* 47*    LIVER FUNCTION TESTS: Recent Labs    01/16/18 1243 07/17/18 1003 10/07/18 1057 11/05/18 1223  BILITOT 0.4 0.4 0.6 0.3  AST 19 18 18 21   ALT 10 9 12 13   ALKPHOS 68 56 59 65  PROT 7.2 7.5 7.8 7.4  ALBUMIN 3.7 3.9 4.0 3.5    Assessment and Plan:  Left MCA M1 segment occlusion s/p emergent mechanical thrombectomy achieving a TICI 2b revascularization 11/05/2018 by Dr. Estanislado Pandy. Patient's condition stable- remains intubated/sedated, poorly responsive with some spontaneous movements of left side, eyes conjugated to right. Right groin  incision stable. ?Family discussion of terminal extubation/comfort care. Further plans per neurology/CCM- appreciate and agree with management. Please call NIR with questions/concerns.   Electronically Signed: Earley Abide, PA-C 11/07/2018, 10:19 AM   I spent a total of 25 Minutes at the the patient's bedside AND on the patient's hospital floor or unit, greater than 50% of which was counseling/coordinating care for left MCA M1 segment occlusion s/p revascularization.

## 2018-11-07 NOTE — Progress Notes (Signed)
STROKE TEAM PROGRESS NOTE   INTERVAL HISTORY  .  She remains intubated.  She is off sedation   but remains globally aphasic and not following any commands with dense right hemiplegia.  Blood pressure adequately controlled.   She did not tolerate spontaneous breathing trials and remains on ventilatory support for respiratory failure.  She is unable to protect her airway and is sedated for comfort. Vitals:   11/07/18 1400 11/07/18 1500 11/07/18 1515 11/07/18 1600  BP: (!) 144/76 129/68  (!) 162/84  Pulse: (!) 120 (!) 122 (!) 121 (!) 124  Resp: 16 14 17 15   Temp:      TempSrc:      SpO2: 100% 100% 100% 100%  Weight:      Height:        CBC:  Recent Labs  Lab 11/06/18 0430 11/07/18 0853  WBC 6.7 6.2  NEUTROABS 5.8 5.0  HGB 8.1* 6.9*  HCT 26.0* 22.1*  MCV 89.7 88.8  PLT 160 129*    Basic Metabolic Panel:  Recent Labs  Lab 11/06/18 0430 11/07/18 0853  NA 139 143  K 3.9 3.6  CL 112* 116*  CO2 19* 17*  GLUCOSE 151* 104*  BUN 30* 19  CREATININE 1.30* 1.15*  CALCIUM 8.0* 7.6*  MG  --  1.6*  PHOS  --  2.6   Lipid Panel:     Component Value Date/Time   CHOL 188 11/06/2018 0430   TRIG 75 11/06/2018 0430   HDL 63 11/06/2018 0430   CHOLHDL 3.0 11/06/2018 0430   VLDL 15 11/06/2018 0430   LDLCALC 110 (H) 11/06/2018 0430   HgbA1c:  Lab Results  Component Value Date   HGBA1C 6.2 (H) 11/06/2018   Urine Drug Screen:     Component Value Date/Time   LABOPIA NONE DETECTED 11/05/2018 2024   COCAINSCRNUR NONE DETECTED 11/05/2018 2024   LABBENZ NONE DETECTED 11/05/2018 2024   AMPHETMU NONE DETECTED 11/05/2018 2024   THCU NONE DETECTED 11/05/2018 2024   LABBARB NONE DETECTED 11/05/2018 2024    Alcohol Level     Component Value Date/Time   ETH <10 11/05/2018 1223    IMAGING Ct Head Code Stroke Wo Contrast 11/05/2018 1238 1. Hyperdense left MCA suggesting large vessel embolism. No hemorrhage or visible infarct. ASPECTS is 10. 2. Remote right lateral lenticulostriate  infarct.   Ct Angio Head W Or Wo Contrast Ct Angio Neck W And/or Wo Contrast Ct Cerebral Perfusion W Contrast 11/05/2018 1320 1. Emergent large vessel occlusion with embolism obstructing the left M1 segment. 2. No infarct by CT perfusion or prior noncontrast head CT. There penumbra throughout the left MCA territory (exact volume overestimated by motion artifact). 3. Non opacified left atrial appendage, question underlying atrial fibrillation. 4. Fibromuscular dysplasia in the neck without superimposed ulceration or stenosis. 5. Atherosclerosis is overall mild for age, although there is a notable moderate right supraclinoid ICA stenosis.   Cerebral angiogram 11/05/2018 S/P Lt common carotid arteriogram followed by endovascular revascularization of occluded 35 MCa M 1 seg with x 2 passes with 20mm x 33 mm embotrap retriever device and x 1 pass with 22mm x 40 mm solitaireX retriever device achieving a TICI 2 B revascularization with reocclusion due to  underlying severe ICAD..  Mr Brain Wo Contrast 11/06/2018 0208 1. Moderate-sized evolving acute ischemic left MCA territory infarct involving the left basal ganglia and left frontotemporal region. No associated hemorrhage or significant regional mass effect. 2. Absent flow void within the distal left MCA branches,  likely occluded. 3. Underlying age-related cerebral atrophy with mild chronic small vessel ischemic disease and chronic right basal ganglia lacunar infarct.    Dg Chest Port 1 View 11/05/2018 Endotracheal tube 12 mm above the carina and should be withdrawn 1-2 cm. No focal infiltrate or effusion is seen. Sent to IR with partial recanalization.   PHYSICAL EXAM Frail elderly African-American lady whose intubated not sedated but unresponsive.   . Afebrile. Head is nontraumatic. Neck is supple without bruit.    Cardiac exam no murmur or gallop. Lungs are clear to auscultation. Distal pulses are well felt. Neurological Exam :  Patient is unresponsive.   She is on ventilator.  Eyes are closed.  She is globally aphasic and does not open eyes or follow any commands.  She has right gaze preference.  She will move eyes partially to the right with doll's eye movements.  Pupils are both irregular and reactive.  Fundi not visualized.  She had dense right hemiplegia with only trace withdrawal in the right lower extremity to painful stimuli.  She has purposeful antigravity movements on the left side.  Left hand has spasticity and fixed flexion contractures of the left hand fingers.  She has some intermittent twitching's of the left hand hand ASSESSMENT/PLAN Angela Higgins is a 85 y.o. female with history of DVT on Xarelto presenting to [facility] ED with sudden onset R sided weakness and aphasia after a sneeze. CTA showed L M1 occlusion.  Stroke:   L MCA infarct s/p IR with partial recanalization secondary to large vessel disease source  Code Stroke CT head No infarct. Hyperdense L MCA. Old R lateral lenticulostriate infarct. ASPECTS 10.     CTA head & neck ELVO L M1. Non opacified LA appendage, ? AF. FMD neck. Mild atherosclerosis.  CT perfusion L MCA penumbra.   Cerebral angio occluded L M1, TICI2b revascularization with 2 passes embotrap and 1 pass solitaire. Underlying ICAD.  Post IR CT no ICH  MRI  Mod L MCA infarct (L BG, L frontotemporal). Absent flow L MCA branches. Small vessel disease. Atrophy. Old R BG lacune.  2D Echo pending   LDL 110  HgbA1c pending  SCDs for VTE prophylaxis  aspirin 81 mg daily and Xarelto (rivaroxaban) daily prior to admission, now on aspirin 300 mg suppository daily.    Therapy recommendations:  pending   Disposition:  pending   Acute Respiratory Failure, unable to protect airway  Intubated for IR  Remains intubated this am  CCM on board  Bradycardia  HR 35-120s-suspect sick sinus syndrome  Hypertension  Home meds:  Amlodipine 5, lisinopril-HCTZ 20-25 . Treated with Cleviprex BP per IR x  24h post IR . Long-term BP goal normotensive  Hyperlipidemia  Home meds:  No statin  LDL 110, goal < 70  Add Lipitor 40 mg daily  Continue statin at discharge  Dysphagia . Secondary to stroke . NPO . Speech on board  Other Stroke Risk Factors  Advanced age  Hx B DVT on Xarelto  Other Active Problems  Hx breast cancer, R  Spastic L hand from diptheria at age 77  Acute blood loss anemia 11.6->8.8->8.1  AKI/CRF Cr 1.3  Hypocalcemia 8.0  Hospital day # 2  She presented with left M1 occlusion with global aphasia and right hemiplegia and underwent mechanical thrombectomy with partial recanalization but neurological exam as well as MRI scan showed moderate large left MCA infarct with significant global aphasia and right hemiplegia.  Today she is having  a right gaze deviation with some twitchings which may suggest possible seizure.  Recommends check EEG and try 1 mg of IV Ativan to see if forced gaze deviation resolves.   Continue ventilatory support for respiratory failure and close neurological monitoring. Discussed with Dr. Lynetta Mare critical care medicine.  Patient is not doing well both from neurological and respiratory standpoint and will likely have poor outcome regardless but we await family's decision about goals of care This patient is critically ill and at significant risk of neurological worsening, death and care requires constant monitoring of vital signs, hemodynamics,respiratory and cardiac monitoring, extensive review of multiple databases, frequent neurological assessment, discussion with family, other specialists and medical decision making of high complexity.I have made any additions or clarifications directly to the above note.This critical care time does not reflect procedure time, or teaching time or supervisory time of PA/NP/Med Resident etc but could involve care discussion time.  I spent 30 minutes of neurocritical care time  in the care of  this patient.       Antony Contras, MD Medical Director [facility] Pager: [phone] 11/07/2018 4:22 PM   To contact Stroke Continuity provider, please refer to [email or web address] After hours, contact General Neurology

## 2018-11-07 NOTE — Progress Notes (Signed)
EEG complete - results pending 

## 2018-11-07 NOTE — Progress Notes (Signed)
NAME:  ELICIA LUI, MRN:  [id number], DOB:  [birth year], LOS: 2 ADMISSION DATE:  11/05/2018, CONSULTATION DATE:  11/05/2018 REFERRING MD:  Neurology CHIEF COMPLAINT:  CVA  Brief History   85yo F PMH R hemispheric CVA /c chronic spastic hemiparesis, DVT on eliquis, Breast CA, CKD 3, A&Ox4 at baseline presented to [facility] /c acute unresponsiveness, left gaze preference, aphasic and not following commands. Code Stroke activated via tele neurology.CTA revealed L M1 occlsuion with significant penumbra. Deemed not a candidate for alteplase due to use of NOAC, on xarelto. Case d/w [facility] and pt transferred to IR for mechanical thrombectomy and revascularization.   History of present illness   85yo F PMH R hemispheric CVA /c chronic spastic hemiparesis, DVT on Eliquis, Breast CA,CKD 3, A&Ox4 at baseline presented to [facility] /c onset of acute unresponsiveness, left gaze preference, aphasic and not following commands. She was in her usual state of health until around noon this date when she was eating lunch and developed sudden right sided weakness. Per EMR she sneezed and became unresponsive. EMS was summoned. On arrival to ED she was hypertensive, BP 186/122, alert but not responding to verbal stimuli, only withdrawing to painful stimuli. Code Stroke activated via tele neurology. CTA revealed L M1 occlsuion with significant penumbra. Deemed not a candidate for alteplase due to use of NOAC, on xarelto. Case d/w [facility] and pt transferred to IR for mechanical thrombectomy and revascularization.   She was intubated prior to procedure d/t inability to protect airway. PCCM consulted for CC management,  11/05/2018 Despite passing wiring revascularization of occluded left middle cerebral artery and reestablishing blood flow reocclusion of her secondary to severe internal carotid arterial disease.  Past Medical History  R hemispheric CVA /c chronic hemiparesis  Breast CA on Arimidex DVT on Eliquis   Osteoporosis on [facility]   7/8 Mechanical thrombectomy and revascularization   Consults:  Neurology PCCM   Procedures/Lines:  7/8 Mechanical thrombectomy and revascularization.  Failed revascularization of left middle cerebral artery ETI 7/8>> L radAline 7/8>> 7/8 rt fem sheath>>7/9  Significant Diagnostic Tests:  CT/CTA head "IMPRESSION: 1. Emergent large vessel occlusion with embolism obstructing the left M1 segment. 2. No infarct by CT perfusion or prior noncontrast head CT. There penumbra throughout the left MCA territory (exact volume overestimated by motion artifact). 3. Non opacified left atrial appendage, question underlying atrial fibrillation. 4. Fibromuscular dysplasia in the neck without superimposed ulceration or stenosis. 5. Atherosclerosis is overall mild for age, although there is a notable moderate right supraclinoid ICA stenosis."  Echo 11/14/16  EF 55-60% G2 DD AV mild regurg MV mild regurg RA moderately dilated PFO TV mild regurg Micro Data:  SARS CoV 2 negative   Antimicrobials:  Not indicated   Interim history/subjective:  Ongoing discussions with family concerning goals of care.  Prognosis is extremely grim.  Objective   Blood pressure (!) 152/57, pulse 66, temperature 99.1 F (37.3 C), temperature source Axillary, resp. rate 16, height 5' (1.524 m), weight 51.1 kg, SpO2 100 %.    Vent Mode: PRVC FiO2 (%):  [30 %] 30 % Set Rate:  [16 bmp] 16 bmp Vt Set:  [400 mL] 400 mL PEEP:  [5 cmH20] 5 cmH20 Plateau Pressure:  [9 cmH20-14 cmH20] 13 cmH20   Intake/Output Summary (Last 24 hours) at 11/07/2018 0908 Last data filed at 11/07/2018 0800 Gross per 24 hour  Intake 200 ml  Output 1300 ml  Net -1100  ml   Filed Weights   11/05/18 1740  Weight: 51.1 kg    Examination: General: Frail elderly female with old contractions on the left HEENT: Endotracheal tube is in place no JVD lymphadenopathy is appreciated,  downward gaze preference Neuro: Spontaneous movement noted does not follow commands.  Contractures on the left CV: Heart sounds are regular, currently in sinus rhythm PULM: even/non-labored, lungs bilaterally managed in the bases GE:XBMW, non-tender, bsx4 active  Extremities: warm/dry, negative edema  Skin: no rashes or lesions   Resolved Hospital Problem list     Assessment & Plan:  This is a 85 yo F with Acute L MCA CVA s/p thrombectomy with resultant respiratory failure d/t inability to protect airway.   Acute L MCA CVA Plan Failed IR attempts for revascularization of left MCA Maintain blood pressure systolic ~413-244 Per neurology   Acute respiratory failure Plan Continue ventilatory support with ventilator Does not tolerate spontaneous breathing trial She is unable to protect her airway Sedation for comfort as needed  History of atrial fibrillation Continue to monitor  Goals of care Per neurology 85 year old with a failed interventional reopening of left MCA.  Currently she is a full code ongoing discussions with family about comfort care.    Best practice:  Diet: NPO Pain/Anxiety/Delirium protocol (if indicated): yes VAP protocol (if indicated): yes DVT prophylaxis: SCDs GI prophylaxis: PPI Glucose control: SSI Mobility: PT/OT per stroke path when appropriate  Code Status: FULL  Family Communication: Per neurology Disposition: admit Neuro ICU   Labs   CBC: Recent Labs  Lab 11/05/18 1223 11/05/18 1320 11/05/18 1805 11/06/18 0430  WBC 5.0  --   --  6.7  NEUTROABS 1.9  --   --  5.8  HGB 9.6* 11.6* 8.8* 8.1*  HCT 31.6* 34.0* 26.0* 26.0*  MCV 91.3  --   --  89.7  PLT 186  --   --  010    Basic Metabolic Panel: Recent Labs  Lab 11/05/18 1223 11/05/18 1320 11/05/18 1805 11/06/18 0430  NA 141 140 142 139  K 4.0 5.0 3.6 3.9  CL 107 105  --  112*  CO2 26  --   --  19*  GLUCOSE 92 86  --  151*  BUN 40* 52*  --  30*  CREATININE 1.32* 1.20*  --   1.30*  CALCIUM 9.3  --   --  8.0*   GFR: Estimated Creatinine Clearance: 19 mL/min (A) (by C-G formula based on SCr of 1.3 mg/dL (H)). Recent Labs  Lab 11/05/18 1223 11/06/18 0430  WBC 5.0 6.7    Liver Function Tests: Recent Labs  Lab 11/05/18 1223  AST 21  ALT 13  ALKPHOS 65  BILITOT 0.3  PROT 7.4  ALBUMIN 3.5   No results for input(s): LIPASE, AMYLASE in the last 168 hours. No results for input(s): AMMONIA in the last 168 hours.  ABG    Component Value Date/Time   PHART 7.510 (H) 11/05/2018 1805   PCO2ART 33.2 11/05/2018 1805   PO2ART 469.0 (H) 11/05/2018 1805   HCO3 26.5 11/05/2018 1805   TCO2 27 11/05/2018 1805   O2SAT 100.0 11/05/2018 1805     Coagulation Profile: Recent Labs  Lab 11/05/18 1223  INR 1.0    Cardiac Enzymes: No results for input(s): CKTOTAL, CKMB, CKMBINDEX, TROPONINI in the last 168 hours.  HbA1C: Hgb A1c MFr Bld  Date/Time Value Ref Range Status  11/06/2018 04:40 AM 6.2 (H) 4.8 - 5.6 % Final  Comment:    (NOTE)         Prediabetes: 5.7 - 6.4         Diabetes: >6.4         Glycemic control for adults with diabetes: <7.0     CBG: Recent Labs  Lab 11/05/18 1223  GLUCAP 86      App Critical care time: 31 min      Steve Wrigley Winborne ACNP Maryanna Shape PCCM Pager [phone] till 1 pm If no answer page [phone] 11/07/2018, 9:08 AM

## 2018-11-07 NOTE — Progress Notes (Signed)
CRITICAL VALUE ALERT  Critical Value:  Hgb 6.9  Date & Time Notied:  7356 11/07/18  Provider Notified: Dr. Lynetta Mare  Orders Received/Actions taken: Will look into patient's chart. No new orders received yet.

## 2018-11-07 NOTE — Progress Notes (Signed)
Occupational Therapy Discharge Patient Details Name: Angela Higgins MRN: [id number] DOB: Feb 14, 1922 Today's Date: 11/07/2018 Time:  -     Patient discharged from OT services secondary to patient has made no progress toward goals in a reasonable time frame.  Please see latest therapy progress note for current level of functioning and progress toward goals.    Progress and discharge plan discussed with patient and/or caregiver: Patient unable to participate in discharge planning and no caregivers available  GO     Jeri Modena, OTR/L  Acute Rehabilitation Services Pager: [phone] Office: [phone] .  Jeri Modena 11/07/2018, 8:25 AM

## 2018-11-07 NOTE — Progress Notes (Signed)
PT Cancellation Note  Patient Details Name: Angela Higgins MRN: [id number] DOB: [birth year]   Cancelled Treatment:    Reason Eval/Treat Not Completed: Patient not medically ready;PT screened, no needs identified, will sign off Family having discussions about terminal extubation vs comfort care; not appropriate for PT services at this time. Continues to be intubated and mostly non responsive. Will sign off for now. Please re consult if anything changes, thanks.   Marguarite Arbour A Sherion Dooly 11/07/2018, 8:10 AM Wray Kearns, PT, DPT Acute Rehabilitation Services Pager [phone] Office [phone]

## 2018-11-07 NOTE — Progress Notes (Signed)
SLP Cancellation Note  Patient Details Name: Angela Higgins MRN: [id number] DOB: 1922-01-28   Cancelled treatment:       Reason Eval/Treat Not Completed: Patient not medically ready(Pt remains intubated at this time. SLP will f/u)  Tobie Poet I. Hardin Negus, [place], [place] Office number [phone] Pager [phone]  Horton Marshall 11/07/2018, 7:59 AM

## 2018-11-07 NOTE — Procedures (Signed)
History: 85 year old female with left MCA stroke  Sedation: None  Technique: This is a 21 channel routine scalp EEG performed at the bedside with bipolar and monopolar montages arranged in accordance to the international 10/20 system of electrode placement. One channel was dedicated to EKG recording.    Background: There is a posterior dominant rhythm of 9 Hz which is seen on the right much better than left.  In addition there is irregular slow activity on the left which is maximal in the frontotemporal region.  No epileptiform activity or sleep was seen.  Photic stimulation: Physiologic driving is not performed  EEG Abnormalities: 1) left-sided slow activity 2) attenuation of faster frequencies on the left  Clinical Interpretation: This EEG is consistent with a focal disturbance in cerebral function in the left hemisphere.  There was no seizure or seizure predisposition recorded on this study. Please note that lack of epileptiform activity on EEG does not preclude the possibility of epilepsy.   Roland Rack, MD [facility] [phone]  If 7pm- 7am, please page neurology on call as listed in [place].

## 2018-11-07 NOTE — Progress Notes (Signed)
Assisted tele visit to patient with family member.  [name], RN  

## 2018-11-08 ENCOUNTER — Inpatient Hospital Stay (HOSPITAL_COMMUNITY): Payer: Medicare HMO

## 2018-11-08 LAB — CBC WITH DIFFERENTIAL/PLATELET
Abs Immature Granulocytes: 0.04 10*3/uL (ref 0.00–0.07)
Basophils Absolute: 0 10*3/uL (ref 0.0–0.1)
Basophils Relative: 0 %
Eosinophils Absolute: 0.2 10*3/uL (ref 0.0–0.5)
Eosinophils Relative: 2 %
HCT: 27.5 % — ABNORMAL LOW (ref 36.0–46.0)
Hemoglobin: 8.7 g/dL — ABNORMAL LOW (ref 12.0–15.0)
Immature Granulocytes: 1 %
Lymphocytes Relative: 10 %
Lymphs Abs: 0.8 10*3/uL (ref 0.7–4.0)
MCH: 28.5 pg (ref 26.0–34.0)
MCHC: 31.6 g/dL (ref 30.0–36.0)
MCV: 90.2 fL (ref 80.0–100.0)
Monocytes Absolute: 0.6 10*3/uL (ref 0.1–1.0)
Monocytes Relative: 8 %
Neutro Abs: 6.2 10*3/uL (ref 1.7–7.7)
Neutrophils Relative %: 79 %
Platelets: 114 10*3/uL — ABNORMAL LOW (ref 150–400)
RBC: 3.05 MIL/uL — ABNORMAL LOW (ref 3.87–5.11)
RDW: 15.9 % — ABNORMAL HIGH (ref 11.5–15.5)
WBC: 7.9 10*3/uL (ref 4.0–10.5)
nRBC: 0 % (ref 0.0–0.2)

## 2018-11-08 LAB — BASIC METABOLIC PANEL
Anion gap: 11 (ref 5–15)
BUN: 19 mg/dL (ref 8–23)
CO2: 15 mmol/L — ABNORMAL LOW (ref 22–32)
Calcium: 7.4 mg/dL — ABNORMAL LOW (ref 8.9–10.3)
Chloride: 114 mmol/L — ABNORMAL HIGH (ref 98–111)
Creatinine, Ser: 1.03 mg/dL — ABNORMAL HIGH (ref 0.44–1.00)
GFR calc Af Amer: 54 mL/min — ABNORMAL LOW (ref >60)
GFR calc non Af Amer: 46 mL/min — ABNORMAL LOW (ref >60)
Glucose, Bld: 85 mg/dL (ref 70–99)
Potassium: 3.6 mmol/L (ref 3.5–5.1)
Sodium: 140 mmol/L (ref 135–145)

## 2018-11-08 LAB — TYPE AND SCREEN
ABO/RH(D): O POS
Antibody Screen: NEGATIVE
Unit division: 0

## 2018-11-08 LAB — BPAM RBC
Blood Product Expiration Date: 202008112359
ISSUE DATE / TIME: 202007101800
Unit Type and Rh: 5100

## 2018-11-08 LAB — GLUCOSE, CAPILLARY
Glucose-Capillary: 74 mg/dL (ref 70–99)
Glucose-Capillary: 98 mg/dL (ref 70–99)
Glucose-Capillary: 112 mg/dL — ABNORMAL HIGH (ref 70–99)

## 2018-11-08 LAB — MAGNESIUM
Magnesium: 1.6 mg/dL — ABNORMAL LOW (ref 1.7–2.4)
Magnesium: 1.7 mg/dL (ref 1.7–2.4)

## 2018-11-08 LAB — HEMOGLOBIN AND HEMATOCRIT, BLOOD
HCT: 26.9 % — ABNORMAL LOW (ref 36.0–46.0)
Hemoglobin: 8.5 g/dL — ABNORMAL LOW (ref 12.0–15.0)

## 2018-11-08 LAB — PHOSPHORUS
Phosphorus: 2 mg/dL — ABNORMAL LOW (ref 2.5–4.6)
Phosphorus: 2.5 mg/dL (ref 2.5–4.6)

## 2018-11-08 MED ORDER — MAGNESIUM GLUCONATE 500 MG PO TABS: 250.0000 mg | ORAL_TABLET | Freq: Two times a day (BID) | ORAL | Status: DC

## 2018-11-08 MED ORDER — ANASTROZOLE 1 MG PO TABS
1.0000 mg | ORAL_TABLET | Freq: Every day | ORAL | Status: DC
  Administered 2018-11-08: 1 mg
  Filled 2018-11-08 (×2): qty 1

## 2018-11-08 MED ORDER — ANASTROZOLE 1 MG PO TABS: 1.0000 mg | ORAL_TABLET | Freq: Every day | ORAL | Status: DC

## 2018-11-08 MED ORDER — PANTOPRAZOLE SODIUM 40 MG PO TBEC: 40.0000 mg | DELAYED_RELEASE_TABLET | Freq: Every day | ORAL | Status: DC

## 2018-11-08 MED ORDER — PANTOPRAZOLE SODIUM 40 MG PO PACK
40.0000 mg | PACK | Freq: Every day | ORAL | Status: DC
  Administered 2018-11-08: 40 mg
  Filled 2018-11-08: qty 20

## 2018-11-08 MED ORDER — VITAL HIGH PROTEIN PO LIQD: 1000.0000 mL | ORAL | Status: DC

## 2018-11-08 MED ORDER — VITAL AF 1.2 CAL PO LIQD
1000.0000 mL | ORAL | Status: DC
  Administered 2018-11-08: 1000 mL
  Filled 2018-11-08: qty 1000

## 2018-11-08 MED ORDER — PRO-STAT SUGAR FREE PO LIQD: 30.0000 mL | Freq: Two times a day (BID) | ORAL | Status: DC

## 2018-11-08 NOTE — Progress Notes (Signed)
Nutrition Follow-up   RD working remotely.   DOCUMENTATION CODES:   Not applicable  INTERVENTION:   Tube Feeding:  Vital AF 1.2 at 45 ml/hr Provides 81 g of protein, 1296 kcals, 875 mL of free water Meets 100% estimated protein needs, 110% calorie needs  NUTRITION DIAGNOSIS:   Inadequate oral intake related to inability to eat as evidenced by NPO status.  Being addressed via TF   GOAL:   Patient will meet greater than or equal to 90% of their needs  Progressing  MONITOR:   Vent status, TF tolerance, Weight trends, Labs, Skin  REASON FOR ASSESSMENT:   Consult Enteral/tube feeding initiation and management  ASSESSMENT:   85 year old female who presented to the ED on 7/08 as a Code Stroke. PMH of breast cancer, HTN, DVT, CKD stage III. Pt intubated in the ED. Pt admitted with acute left MCA syndrome due to occluded left MCA.   7/08 Emergent mechanical thrombectomy achieving a TICI 2b revascularization; failed revascularization of L. MCA  Pt remains full code; ongoing discussions with family regarding comfort care Patient is currently intubated on ventilator support, unable to protect airway, not tolerate SBT MV: 9.2 L/min Temp (24hrs), Avg:99.3 F (37.4 C), Min:98.6 F (37 C), Max:100 F (37.8 C)  Cleviprex: OFF Propofol: NONE  Current wt 51.1 kg; admit wt 51.1 kg  Abd xray from 7/09 with OG tube with tip at GE junction; Chest xray from 7/10 suggesting tip of tube at upper most portion of stomach with advancement by 10 cm recommended. Spoke with RN who is planning on advancing tube prior to initiation of TF  Labs: phosphorus 2.0 (L), potasium wdl, Creatinine 1.03, BUN wdl Meds: NS at 75 ml/hr  Diet Order:   Diet Order            Diet NPO time specified  Diet effective now              EDUCATION NEEDS:   No education needs have been identified at this time  Skin:  Skin Assessment: Reviewed RN Assessment  Last BM:  7/10  Height:   Ht  Readings from Last 1 Encounters:  11/05/18 5' (1.524 m)    Weight:   Wt Readings from Last 1 Encounters:  11/08/18 51.1 kg    Ideal Body Weight:  45.5 kg  BMI:  Body mass index is 22 kg/m.  Estimated Nutritional Needs:   Kcal:  1190  Protein:  77-90 g  Fluid:  >/= 1.3   [employer] MS, RDN, LDN, CNSC [phone] Pager  [phone] Weekend/On-Call Pager

## 2018-11-08 NOTE — Progress Notes (Signed)
SLP Cancellation Note  Patient Details Name: Angela Higgins MRN: [id number] DOB: 1922/01/23   Cancelled treatment:       Reason Eval/Treat Not Completed: Patient not medically ready. Remains intubated.   Gabriel Rainwater MA, CCC-SLP     Chyane Greer Meryl 11/08/2018, 9:42 AM

## 2018-11-08 NOTE — Progress Notes (Signed)
NAME:  Angela Higgins, MRN:  [id number], DOB:  1922/03/17, LOS: 3 ADMISSION DATE:  11/05/2018, CONSULTATION DATE:  11/05/2018 REFERRING MD:  Neurology CHIEF COMPLAINT:  CVA  Brief History   85yo F PMH R hemispheric CVA /c chronic spastic hemiparesis, DVT on eliquis, Breast CA, CKD 3, A&Ox4 at baseline presented to [facility] /c acute unresponsiveness, left gaze preference, aphasic and not following commands. Code Stroke activated via tele neurology.CTA revealed L M1 occlsuion with significant penumbra. Deemed not a candidate for alteplase due to use of NOAC, on xarelto. Case d/w [facility] and pt transferred to IR for mechanical thrombectomy and revascularization.   Past Medical History  R hemispheric CVA /c chronic hemiparesis  Breast CA on Arimidex DVT on Eliquis  Osteoporosis on [place] [facility] Events   7/8 Mechanical thrombectomy and revascularization   Consults:  Neurology PCCM   Procedures/Lines:  7/8 Mechanical thrombectomy and revascularization.  Failed revascularization of left middle cerebral artery ETI 7/8>> L radAline 7/8>> 7/8 rt fem sheath>>7/9  Significant Diagnostic Tests:  CT/CTA head "IMPRESSION: 1. Emergent large vessel occlusion with embolism obstructing the left M1 segment. 2. No infarct by CT perfusion or prior noncontrast head CT. There penumbra throughout the left MCA territory (exact volume overestimated by motion artifact). 3. Non opacified left atrial appendage, question underlying atrial fibrillation. 4. Fibromuscular dysplasia in the neck without superimposed ulceration or stenosis. 5. Atherosclerosis is overall mild for age, although there is a notable moderate right supraclinoid ICA stenosis."  Echo 11/14/16  EF 55-60% G2 DD AV mild regurg MV mild regurg RA moderately dilated PFO TV mild regurg Micro Data:  SARS CoV 2 negative   Antimicrobials:  Not indicated   Interim history/subjective:  Ongoing discussions with  family concerning goals of care.  Prognosis is extremely grim. Sister was to come in, but is now in ED with dyspnea.  Objective   Blood pressure 140/77, pulse (!) 121, temperature 98.6 F (37 C), temperature source Axillary, resp. rate 16, height 5' (1.524 m), weight 51.1 kg, SpO2 100 %.    Vent Mode: PRVC FiO2 (%):  [30 %] 30 % Set Rate:  [16 bmp] 16 bmp Vt Set:  [400 mL] 400 mL PEEP:  [5 cmH20] 5 cmH20 Pressure Support:  [12 cmH20] 12 cmH20 Plateau Pressure:  [10 cmH20-14 cmH20] 14 cmH20   Intake/Output Summary (Last 24 hours) at 11/08/2018 1245 Last data filed at 11/08/2018 1200 Gross per 24 hour  Intake 1798.31 ml  Output 1340 ml  Net 458.31 ml   Filed Weights   11/05/18 1740 11/08/18 0900  Weight: 51.1 kg 51.1 kg    Examination: General: Frail elderly female with old contractions on the left HEENT: Endotracheal tube is in place no JVD lymphadenopathy is appreciated, downward gaze preference Neuro: Spontaneous movement noted does not follow commands.  Contractures on the left. No response to pain on right. CV: Heart sounds are regular, currently in sinus rhythm PULM: even/non-labored, lungs bilaterally clear. DU:KGUR, non-tender, bsx4 active  Extremities: warm/dry, negative edema  Skin: no rashes or lesions   Resolved Hospital Problem list     Assessment & Plan:  This is a 85 yo F with Acute L MCA CVA s/p thrombectomy with resultant respiratory failure d/t inability to protect airway.   Acute L MCA CVA Plan Failed IR attempts for revascularization of left MCA Maintain blood pressure systolic ~[phone]  Critically ill due to acute respiratory failure requiring mechanical ventilation. Plan Continue ventilatory support with  ventilator Does not tolerate spontaneous breathing trial She is unable to protect her airway Sedation for comfort as needed  History of atrial fibrillation Continue to monitor  Goals of care Per neurology 85 year old with a failed  interventional reopening of left MCA.  Currently she is a full code ongoing discussions with family about comfort care.    Best practice:  Diet: NPO Pain/Anxiety/Delirium protocol (if indicated): yes VAP protocol (if indicated): yes DVT prophylaxis: SCDs GI prophylaxis: PPI Glucose control: SSI Mobility: PT/OT per stroke path when appropriate  Code Status: FULL  Family Communication: Per neurology Disposition: admit Neuro ICU   Labs   CBC: Recent Labs  Lab 11/05/18 1223  11/05/18 1805 11/06/18 0430 11/07/18 0853 11/07/18 2345 11/08/18 0329  WBC 5.0  --   --  6.7 6.2  --  7.9  NEUTROABS 1.9  --   --  5.8 5.0  --  6.2  HGB 9.6*   < > 8.8* 8.1* 6.9* 8.5* 8.7*  HCT 31.6*   < > 26.0* 26.0* 22.1* 26.9* 27.5*  MCV 91.3  --   --  89.7 88.8  --  90.2  PLT 186  --   --  160 129*  --  114*   < > = values in this interval not displayed.    Basic Metabolic Panel: Recent Labs  Lab 11/05/18 1223 11/05/18 1320 11/05/18 1805 11/06/18 0430 11/07/18 0853 11/08/18 0329  NA 141 140 142 139 143 140  K 4.0 5.0 3.6 3.9 3.6 3.6  CL 107 105  --  112* 116* 114*  CO2 26  --   --  19* 17* 15*  GLUCOSE 92 86  --  151* 104* 85  BUN 40* 52*  --  30* 19 19  CREATININE 1.32* 1.20*  --  1.30* 1.15* 1.03*  CALCIUM 9.3  --   --  8.0* 7.6* 7.4*  MG  --   --   --   --  1.6* 1.6*  PHOS  --   --   --   --  2.6 2.0*   GFR: Estimated Creatinine Clearance: 24 mL/min (A) (by C-G formula based on SCr of 1.03 mg/dL (H)). Recent Labs  Lab 11/05/18 1223 11/06/18 0430 11/07/18 0853 11/08/18 0329  WBC 5.0 6.7 6.2 7.9    Liver Function Tests: Recent Labs  Lab 11/05/18 1223  AST 21  ALT 13  ALKPHOS 65  BILITOT 0.3  PROT 7.4  ALBUMIN 3.5   No results for input(s): LIPASE, AMYLASE in the last 168 hours. No results for input(s): AMMONIA in the last 168 hours.  ABG    Component Value Date/Time   PHART 7.510 (H) 11/05/2018 1805   PCO2ART 33.2 11/05/2018 1805   PO2ART 469.0 (H) 11/05/2018  1805   HCO3 26.5 11/05/2018 1805   TCO2 27 11/05/2018 1805   O2SAT 100.0 11/05/2018 1805     Coagulation Profile: Recent Labs  Lab 11/05/18 1223  INR 1.0    Cardiac Enzymes: No results for input(s): CKTOTAL, CKMB, CKMBINDEX, TROPONINI in the last 168 hours.  HbA1C: Hgb A1c MFr Bld  Date/Time Value Ref Range Status  11/06/2018 04:40 AM 6.2 (H) 4.8 - 5.6 % Final    Comment:    (NOTE)         Prediabetes: 5.7 - 6.4         Diabetes: >6.4         Glycemic control for adults with diabetes: <7.0     CBG: Recent  Labs  Lab 11/05/18 1223  GLUCAP 86   CRITICAL CARE Performed by: Kipp Brood   Total critical care time: 35 minutes  Critical care time was exclusive of separately billable procedures and treating other patients.  Critical care was necessary to treat or prevent imminent or life-threatening deterioration.  Critical care was time spent personally by me on the following activities: development of treatment plan with patient and/or surrogate as well as nursing, discussions with consultants, evaluation of patient's response to treatment, examination of patient, obtaining history from patient or surrogate, ordering and performing treatments and interventions, ordering and review of laboratory studies, ordering and review of radiographic studies, pulse oximetry, re-evaluation of patient's condition and participation in multidisciplinary rounds.  Kipp Brood, MD [facility] ICU Physician [place]  Pager: [phone] Mobile: [phone] After hours: [phone].   11/08/2018, 12:45 PM

## 2018-11-08 NOTE — Progress Notes (Signed)
Family was not able to meet this am for goals of care discussions d/t pt's sister being admitted into the hospital when they arrived this am. The rest of the family has agreed to return tomorrow 7/12 at 10am to meet with Dr. Lynetta Mare. Siboney Requejo, Rande Brunt, RN

## 2018-11-08 NOTE — Progress Notes (Signed)
STROKE TEAM PROGRESS NOTE   INTERVAL HISTORY  She remains intubated.  She is off sedation   but remains globally aphasic and not following any commands with dense right hemiplegia.  Blood pressure adequately controlled.   She did not tolerate spontaneous breathing trials and remains on ventilatory support for respiratory failure.  She is unable to protect her airway and is sedated for comfort.   Vitals:   11/08/18 1000 11/08/18 1100 11/08/18 1101 11/08/18 1200  BP: (!) 159/62 (!) 155/61 (!) 155/61 140/77  Pulse: 63 65 68 (!) 121  Resp: (!) 9 13 16 16   Temp:      TempSrc:      SpO2: 100% 100% 100% 100%  Weight:      Height:        CBC:  Recent Labs  Lab 11/07/18 0853 11/07/18 2345 11/08/18 0329  WBC 6.2  --  7.9  NEUTROABS 5.0  --  6.2  HGB 6.9* 8.5* 8.7*  HCT 22.1* 26.9* 27.5*  MCV 88.8  --  90.2  PLT 129*  --  114*    Basic Metabolic Panel:  Recent Labs  Lab 11/07/18 0853 11/08/18 0329  NA 143 140  K 3.6 3.6  CL 116* 114*  CO2 17* 15*  GLUCOSE 104* 85  BUN 19 19  CREATININE 1.15* 1.03*  CALCIUM 7.6* 7.4*  MG 1.6* 1.6*  PHOS 2.6 2.0*   Lipid Panel:     Component Value Date/Time   CHOL 188 11/06/2018 0430   TRIG 75 11/06/2018 0430   HDL 63 11/06/2018 0430   CHOLHDL 3.0 11/06/2018 0430   VLDL 15 11/06/2018 0430   LDLCALC 110 (H) 11/06/2018 0430   HgbA1c:  Lab Results  Component Value Date   HGBA1C 6.2 (H) 11/06/2018   Urine Drug Screen:     Component Value Date/Time   LABOPIA NONE DETECTED 11/05/2018 2024   COCAINSCRNUR NONE DETECTED 11/05/2018 2024   LABBENZ NONE DETECTED 11/05/2018 2024   AMPHETMU NONE DETECTED 11/05/2018 2024   THCU NONE DETECTED 11/05/2018 2024   LABBARB NONE DETECTED 11/05/2018 2024    Alcohol Level     Component Value Date/Time   ETH <10 11/05/2018 1223    IMAGING  Ct Head Code Stroke Wo Contrast 11/05/2018 1238 1. Hyperdense left MCA suggesting large vessel embolism. No hemorrhage or visible infarct. ASPECTS is 10.   2. Remote right lateral lenticulostriate infarct.    Ct Angio Head W Or Wo Contrast Ct Angio Neck W And/or Wo Contrast Ct Cerebral Perfusion W Contrast 11/05/2018 1320 1. Emergent large vessel occlusion with embolism obstructing the left M1 segment.  2. No infarct by CT perfusion or prior noncontrast head CT. There penumbra throughout the left MCA territory (exact volume overestimated by motion artifact).  3. Non opacified left atrial appendage, question underlying atrial fibrillation.  4. Fibromuscular dysplasia in the neck without superimposed ulceration or stenosis.  5. Atherosclerosis is overall mild for age, although there is a notable moderate right supraclinoid ICA stenosis.    Cerebral angiogram 11/05/2018 S/P Lt common carotid arteriogram followed by endovascular revascularization of occluded 59 MCa M 1 seg with x 2 passes with 19mm x 33 mm embotrap retriever device and x 1 pass with 63mm x 40 mm solitaireX retriever device achieving a TICI 2 B revascularization with reocclusion due to  underlying severe ICAD..   Mr Brain Wo Contrast 11/06/2018 0208 1. Moderate-sized evolving acute ischemic left MCA territory infarct involving the left basal ganglia and left  frontotemporal region. No associated hemorrhage or significant regional mass effect.  2. Absent flow void within the distal left MCA branches, likely occluded.  3. Underlying age-related cerebral atrophy with mild chronic small vessel ischemic disease and chronic right basal ganglia lacunar infarct.     Dg Chest Port 1 View 11/05/2018 Endotracheal tube 12 mm above the carina and should be withdrawn 1-2 cm. No focal infiltrate or effusion is seen. Sent to IR with partial recanalization.   Transthoracic Echocardiogram  11/06/2018 IMPRESSIONS  1. The left ventricle has hyperdynamic systolic function, with an ejection fraction of >65%. The cavity size was normal. Left ventricular diastolic Doppler parameters are indeterminate. No  evidence of left ventricular regional wall motion abnormalities.  2. The right ventricle has normal systolic function. The cavity was normal. There is no increase in right ventricular wall thickness. Right ventricular systolic pressure is mildly elevated with an estimated pressure of 40.4 mmHg.  3. Left atrial size was moderately dilated.  4. Right atrial size was moderately dilated.  5. Small pericardial effusion.  6. Tricuspid valve regurgitation is mild-moderate.  7. The aortic valve is tricuspid. Aortic valve regurgitation is mild to moderate by color flow Doppler. No stenosis of the aortic valve.  8. The aortic root, ascending aorta and descending aorta are normal in size and structure.  9. The inferior vena cava was dilated in size with <50% respiratory variability. 10. Moderately sized patent foramen ovale with predominantly left to right shunting across the atrial septum. 11. Evidence of atrial level shunting detected by color flow Doppler.  EEG 11/07/2018 EEG Abnormalities:  1) left-sided slow activity 2) attenuation of faster frequencies on the left Clinical Interpretation: This EEG is consistent with a focal disturbance in cerebral function in the left hemisphere.  There was no seizure or seizure predisposition recorded on this study. Please note that lack of epileptiform activity on EEG does not preclude the possibility of epilepsy.    PHYSICAL EXAM  Frail elderly African-American lady whose intubated not sedated but unresponsive.   . Afebrile. Head is nontraumatic. Neck is supple without bruit.    Cardiac exam no murmur or gallop. Lungs are clear to auscultation. Distal pulses are well felt. Neurological Exam : stable. Patient is unresponsive.  She is on ventilator.  Eyes are closed.  She is globally aphasic and does not open eyes or follow any commands.  She has right gaze preference.  She will move eyes partially to the right with doll's eye movements.  Pupils are both  irregular and reactive.  Fundi not visualized.  She had dense right hemiplegia with only trace withdrawal in the right lower extremity to painful stimuli.  She has purposeful antigravity movements on the left side.  Left hand has spasticity and fixed flexion contractures of the left hand fingers.  She has some intermittent twitching's of the left hand hand   ASSESSMENT/PLAN Angela Higgins is a 85 y.o. female with history of DVT on Xarelto presenting to [facility] ED with sudden onset R sided weakness and aphasia after a sneeze. CTA showed L M1 occlusion.  Stroke:   L MCA infarct s/p IR with partial recanalization secondary to large vessel disease source  Code Stroke CT head No infarct. Hyperdense L MCA. Old R lateral lenticulostriate infarct. ASPECTS 10.     CTA head & neck ELVO L M1. Non opacified LA appendage, ? AF. FMD neck. Mild atherosclerosis.  CT perfusion L MCA penumbra.   Cerebral angio occluded L M1,  TICI2b revascularization with 2 passes embotrap and 1 pass solitaire. Underlying ICAD.  Post IR CT no ICH  MRI  Mod L MCA infarct (L BG, L frontotemporal). Absent flow L MCA branches. Small vessel disease. Atrophy. Old R BG lacune.  2D Echo - EF > 65% Moderately sized patent foramen ovale with predominantly left to right shunting across the atrial septum.  LDL 110  HgbA1c - 6.2  EEG - 11/07/18 - see report above  SCDs for VTE prophylaxis  aspirin 81 mg daily and Xarelto (rivaroxaban) daily prior to admission, now on aspirin 300 mg suppository daily.    Therapy recommendations:  pending   Disposition:  pending   Acute Respiratory Failure, unable to protect airway  Intubated for IR  Remains intubated this am  CCM on board  Bradycardia  HR 35-120s-suspect sick sinus syndrome  Hypertension  Home meds:  Amlodipine 5, lisinopril-HCTZ 20-25 . Treated with Cleviprex BP per IR x 24h post IR . Long-term BP goal normotensive  Hyperlipidemia  Home meds:  No  statin  LDL 110, goal < 70  Add Lipitor 40 mg daily  Continue statin at discharge  Dysphagia . Secondary to stroke . NPO . Speech on board  Other Stroke Risk Factors  Advanced age  Hx B DVT on Xarelto  Possible Seizure Activity  EEG - 11/07/18 - see report above  Per Dr Clydene Fake note - try 1 mg of IV Ativan to see if forced gaze deviation resolves.  EEG did not show epileptiform activity. No improvement with ativan.  Other Active Problems  Hx breast cancer, R  Spastic L hand from diptheria at age 29  Acute blood loss anemia 11.6->8.8->8.1->8.7  AKI/CRF Cr 1.3->1.03  Hypocalcemia 8.0Mg    Phos - 2.0  Mg - 1.6  Hx of DVT on Eliquis PTA - now with PFO on echo - consider LE dopplers  Possible afib hx.  Family to decide on goals of care  Thrombocytopenia -  129->114  Hospital day # 3   This patient is critically ill and at significant risk of neurological worsening, death and care requires constant monitoring of vital signs, hemodynamics,respiratory and cardiac monitoring,review of multiple databases, neurological assessment, discussion with family, other specialists and medical decision making of high complexity.I  I spent 30 minutes of neurocritical care time in the care of this patient.  Sarina Ill, MD [facility]          To contact Stroke Continuity provider, please refer to [email or web address] After hours, contact General Neurology

## 2018-11-09 DIAGNOSIS — I495 Sick sinus syndrome: Secondary | ICD-10-CM

## 2018-11-09 DIAGNOSIS — Q211 Atrial septal defect: Secondary | ICD-10-CM

## 2018-11-09 DIAGNOSIS — I10 Essential (primary) hypertension: Secondary | ICD-10-CM

## 2018-11-09 DIAGNOSIS — E785 Hyperlipidemia, unspecified: Secondary | ICD-10-CM

## 2018-11-09 DIAGNOSIS — I6602 Occlusion and stenosis of left middle cerebral artery: Secondary | ICD-10-CM

## 2018-11-09 DIAGNOSIS — Z86718 Personal history of other venous thrombosis and embolism: Secondary | ICD-10-CM

## 2018-11-09 DIAGNOSIS — I63412 Cerebral infarction due to embolism of left middle cerebral artery: Secondary | ICD-10-CM

## 2018-11-09 DIAGNOSIS — Z4659 Encounter for fitting and adjustment of other gastrointestinal appliance and device: Secondary | ICD-10-CM

## 2018-11-09 LAB — BASIC METABOLIC PANEL
Anion gap: 8 (ref 5–15)
BUN: 27 mg/dL — ABNORMAL HIGH (ref 8–23)
CO2: 18 mmol/L — ABNORMAL LOW (ref 22–32)
Calcium: 7.2 mg/dL — ABNORMAL LOW (ref 8.9–10.3)
Chloride: 114 mmol/L — ABNORMAL HIGH (ref 98–111)
Creatinine, Ser: 1.06 mg/dL — ABNORMAL HIGH (ref 0.44–1.00)
GFR calc Af Amer: 52 mL/min — ABNORMAL LOW (ref >60)
GFR calc non Af Amer: 45 mL/min — ABNORMAL LOW (ref >60)
Glucose, Bld: 201 mg/dL — ABNORMAL HIGH (ref 70–99)
Potassium: 3.6 mmol/L (ref 3.5–5.1)
Sodium: 140 mmol/L (ref 135–145)

## 2018-11-09 LAB — CBC
HCT: 26.1 % — ABNORMAL LOW (ref 36.0–46.0)
Hemoglobin: 8.2 g/dL — ABNORMAL LOW (ref 12.0–15.0)
MCH: 27.6 pg (ref 26.0–34.0)
MCHC: 31.4 g/dL (ref 30.0–36.0)
MCV: 87.9 fL (ref 80.0–100.0)
Platelets: 123 10*3/uL — ABNORMAL LOW (ref 150–400)
RBC: 2.97 MIL/uL — ABNORMAL LOW (ref 3.87–5.11)
RDW: 15.9 % — ABNORMAL HIGH (ref 11.5–15.5)
WBC: 9.7 10*3/uL (ref 4.0–10.5)
nRBC: 0 % (ref 0.0–0.2)

## 2018-11-09 LAB — GLUCOSE, CAPILLARY
Glucose-Capillary: 149 mg/dL — ABNORMAL HIGH (ref 70–99)
Glucose-Capillary: 166 mg/dL — ABNORMAL HIGH (ref 70–99)

## 2018-11-09 LAB — MAGNESIUM: Magnesium: 1.7 mg/dL (ref 1.7–2.4)

## 2018-11-09 LAB — PHOSPHORUS: Phosphorus: 1.4 mg/dL — ABNORMAL LOW (ref 2.5–4.6)

## 2018-11-09 MED ORDER — MORPHINE SULFATE (PF) 2 MG/ML IV SOLN
2.0000 mg | INTRAVENOUS | Status: DC | PRN
  Administered 2018-11-09: 4 mg via INTRAVENOUS
  Filled 2018-11-09 (×2): qty 2

## 2018-11-09 MED ORDER — ACETAMINOPHEN 325 MG PO TABS: 650.0000 mg | ORAL_TABLET | Freq: Four times a day (QID) | ORAL | Status: DC | PRN

## 2018-11-09 MED ORDER — DEXTROSE 5 % IV SOLN: INTRAVENOUS | Status: DC

## 2018-11-09 MED ORDER — ACETAMINOPHEN 650 MG RE SUPP: 650.0000 mg | Freq: Four times a day (QID) | RECTAL | Status: DC | PRN

## 2018-11-09 MED ORDER — MORPHINE SULFATE (PF) 2 MG/ML IV SOLN
2.0000 mg | INTRAVENOUS | Status: DC | PRN
  Administered 2018-11-10: 4 mg via INTRAVENOUS
  Filled 2018-11-09: qty 2

## 2018-11-09 MED ORDER — GLYCOPYRROLATE 1 MG PO TABS: 1.0000 mg | ORAL_TABLET | ORAL | Status: DC | PRN

## 2018-11-09 MED ORDER — GLYCOPYRROLATE 0.2 MG/ML IJ SOLN: 0.2000 mg | INTRAMUSCULAR | Status: DC | PRN

## 2018-11-09 MED ORDER — POLYVINYL ALCOHOL 1.4 % OP SOLN: 1.0000 [drp] | Freq: Four times a day (QID) | OPHTHALMIC | Status: DC | PRN

## 2018-11-09 MED ORDER — GLYCOPYRROLATE 0.2 MG/ML IJ SOLN
0.2000 mg | INTRAMUSCULAR | Status: DC | PRN
  Administered 2018-11-09 – 2018-11-11 (×6): 0.2 mg via INTRAVENOUS
  Filled 2018-11-09 (×6): qty 1

## 2018-11-09 MED ORDER — DIPHENHYDRAMINE HCL 50 MG/ML IJ SOLN: 25.0000 mg | INTRAMUSCULAR | Status: DC | PRN

## 2018-11-09 MED ORDER — LORAZEPAM 2 MG/ML IJ SOLN
2.0000 mg | INTRAMUSCULAR | Status: DC | PRN
  Administered 2018-11-10 – 2018-11-11 (×2): 2 mg via INTRAVENOUS
  Filled 2018-11-09 (×2): qty 1
  Filled 2018-11-09: qty 2

## 2018-11-09 MED ORDER — SCOPOLAMINE 1 MG/3DAYS TD PT72
1.0000 | MEDICATED_PATCH | TRANSDERMAL | Status: DC
  Administered 2018-11-09: 1.5 mg via TRANSDERMAL
  Filled 2018-11-09 (×2): qty 1

## 2018-11-09 NOTE — Progress Notes (Signed)
STROKE TEAM PROGRESS NOTE   INTERVAL HISTORY Family members are at the bedside. She remains intubated. Dr. Richarda Overlie talking with family regarding poor prognosis and potential comfort care measures.   Vitals:   11/09/18 1200 11/09/18 1300 11/09/18 1400 11/09/18 1514  BP:    132/74  Pulse: 95 74 72 90  Resp: (!) 24 (!) 21 (!) 22 (!) 21  Temp:    98.6 F (37 C)  TempSrc:    Axillary  SpO2: 92% 92% 93% 95%  Weight:      Height:        CBC:  Recent Labs  Lab 11/07/18 0853  11/08/18 0329 11/09/18 0712  WBC 6.2  --  7.9 9.7  NEUTROABS 5.0  --  6.2  --   HGB 6.9*   < > 8.7* 8.2*  HCT 22.1*   < > 27.5* 26.1*  MCV 88.8  --  90.2 87.9  PLT 129*  --  114* 123*   < > = values in this interval not displayed.    Basic Metabolic Panel:  Recent Labs  Lab 11/08/18 0329 11/08/18 1638 11/09/18 0712  NA 140  --  140  K 3.6  --  3.6  CL 114*  --  114*  CO2 15*  --  18*  GLUCOSE 85  --  201*  BUN 19  --  27*  CREATININE 1.03*  --  1.06*  CALCIUM 7.4*  --  7.2*  MG 1.6* 1.7 1.7  PHOS 2.0* 2.5 1.4*   Lipid Panel:     Component Value Date/Time   CHOL 188 11/06/2018 0430   TRIG 75 11/06/2018 0430   HDL 63 11/06/2018 0430   CHOLHDL 3.0 11/06/2018 0430   VLDL 15 11/06/2018 0430   LDLCALC 110 (H) 11/06/2018 0430   HgbA1c:  Lab Results  Component Value Date   HGBA1C 6.2 (H) 11/06/2018   Urine Drug Screen:     Component Value Date/Time   LABOPIA NONE DETECTED 11/05/2018 2024   COCAINSCRNUR NONE DETECTED 11/05/2018 2024   LABBENZ NONE DETECTED 11/05/2018 2024   AMPHETMU NONE DETECTED 11/05/2018 2024   THCU NONE DETECTED 11/05/2018 2024   LABBARB NONE DETECTED 11/05/2018 2024    Alcohol Level     Component Value Date/Time   ETH <10 11/05/2018 1223    IMAGING  Ct Head Code Stroke Wo Contrast 11/05/2018 1238 1. Hyperdense left MCA suggesting large vessel embolism. No hemorrhage or visible infarct. ASPECTS is 10.  2. Remote right lateral lenticulostriate infarct.     Ct Angio Head W Or Wo Contrast Ct Angio Neck W And/or Wo Contrast Ct Cerebral Perfusion W Contrast 11/05/2018 1320 1. Emergent large vessel occlusion with embolism obstructing the left M1 segment.  2. No infarct by CT perfusion or prior noncontrast head CT. There penumbra throughout the left MCA territory (exact volume overestimated by motion artifact).  3. Non opacified left atrial appendage, question underlying atrial fibrillation.  4. Fibromuscular dysplasia in the neck without superimposed ulceration or stenosis.  5. Atherosclerosis is overall mild for age, although there is a notable moderate right supraclinoid ICA stenosis.    Cerebral angiogram 11/05/2018 S/P Lt common carotid arteriogram followed by endovascular revascularization of occluded 44 MCa M 1 seg with x 2 passes with 68mm x 33 mm embotrap retriever device and x 1 pass with 56mm x 40 mm solitaireX retriever device achieving a TICI 2 B revascularization with reocclusion due to  underlying severe ICAD..   Mr Brain  Wo Contrast 11/06/2018 0208 1. Moderate-sized evolving acute ischemic left MCA territory infarct involving the left basal ganglia and left frontotemporal region. No associated hemorrhage or significant regional mass effect.  2. Absent flow void within the distal left MCA branches, likely occluded.  3. Underlying age-related cerebral atrophy with mild chronic small vessel ischemic disease and chronic right basal ganglia lacunar infarct.     Dg Chest Port 1 View 11/05/2018 Endotracheal tube 12 mm above the carina and should be withdrawn 1-2 cm. No focal infiltrate or effusion is seen. Sent to IR with partial recanalization.   Transthoracic Echocardiogram  11/06/2018 IMPRESSIONS  1. The left ventricle has hyperdynamic systolic function, with an ejection fraction of >65%. The cavity size was normal. Left ventricular diastolic Doppler parameters are indeterminate. No evidence of left ventricular regional wall motion  abnormalities.  2. The right ventricle has normal systolic function. The cavity was normal. There is no increase in right ventricular wall thickness. Right ventricular systolic pressure is mildly elevated with an estimated pressure of 40.4 mmHg.  3. Left atrial size was moderately dilated.  4. Right atrial size was moderately dilated.  5. Small pericardial effusion.  6. Tricuspid valve regurgitation is mild-moderate.  7. The aortic valve is tricuspid. Aortic valve regurgitation is mild to moderate by color flow Doppler. No stenosis of the aortic valve.  8. The aortic root, ascending aorta and descending aorta are normal in size and structure.  9. The inferior vena cava was dilated in size with <50% respiratory variability. 10. Moderately sized patent foramen ovale with predominantly left to right shunting across the atrial septum. 11. Evidence of atrial level shunting detected by color flow Doppler.  EEG 11/07/2018 EEG Abnormalities:  1) left-sided slow activity 2) attenuation of faster frequencies on the left Clinical Interpretation: This EEG is consistent with a focal disturbance in cerebral function in the left hemisphere.  There was no seizure or seizure predisposition recorded on this study. Please note that lack of epileptiform activity on EEG does not preclude the possibility of epilepsy.    PHYSICAL EXAM Frail elderly African-American lady whose was remain intubated not sedated but unresponsive. Afebrile. Head is nontraumatic. Neck is supple without bruit.    Cardiac exam no murmur or gallop. Lungs are clear to auscultation. Distal pulses are well felt. Neurological Exam: Patient is unresponsive.  She is on ventilator.  Eyes are closed.  She does not open eyes or follow any commands.  Her eyes mid position with positive doll's eye movements. Pupils are 79mm and sluggish to light.  Fundi not visualized. Corneal bilaterally weak, positive gag. She had dense right hemiplegia with only trace  withdrawal in the right lower extremity to painful stimuli.  She has mild withdraw movements on the left side.  Left hand has spasticity and fixed flexion contractures of the left hand fingers.  Sensation, coordination and gait not tested.   ASSESSMENT/PLAN Angela Higgins is a 85 y.o. female with history of DVT on Xarelto presenting to [facility] ED with sudden onset R sided weakness and aphasia after a sneeze. CTA showed L M1 occlusion.  Stroke:   L MCA infarct due to left M1 occlusion s/p IR with initial partial recanalization but then re-occluded, likely secondary to large vessel disease source  Code Stroke CT head No infarct. Hyperdense L MCA. Old R lateral lenticulostriate infarct.    CTA head & neck ELVO L M1. Non opacified LA appendage. FMD neck.   CT perfusion L MCA penumbra.  Cerebral angio occluded L M1, TICI2b revascularization with reocclusion. Underlying ICAD.  MRI  Mod L MCA infarct (L BG, L frontotemporal). Absent flow L MCA branches. Old R BG lacune.  2D Echo - EF > 65% Moderately sized PFO.  LDL 110  HgbA1c - 6.2  EEG - 11/07/18 - left hemisphere slowing  SCDs for VTE prophylaxis  aspirin 81 mg daily and Xarelto (rivaroxaban) daily prior to admission, now on aspirin 300 mg suppository daily.    Disposition:  Dr. Lynetta Mare is talking with family regarding poor prognosis and comfort care measures  Acute Respiratory Failure, unable to protect airway  Intubated for IR  Remains intubated  On vent  CCM on board  Bradycardia/tachycardia  HR 35-120s-suspect sick sinus syndrome  Also suspect occult afib for underlying current stroke  CCM on board  Avoid beta blockers  Hypertension  Home meds:  Amlodipine 5, lisinopril-HCTZ 20-25 . Treated with Cleviprex s/p IR x 24h post IR . Long-term BP goal normotensive  Hyperlipidemia  Home meds:  No statin  LDL 110, goal < 70  Consider statin if aggressive care  Dysphagia . Secondary to  stroke . NPO . On TF @ 21 . Speech on board  PFO   found on echo test  Will consider LE venous doppler if family request continued aggressive care  Other Stroke Risk Factors  Advanced age  Hx B DVT on Xarelto  Other Active Problems  Hx breast cancer, R  Spastic L hand from diptheria at age 17  Acute blood loss anemia 11.6->8.8->8.1->8.7  AKI/CRF Cr 1.3->1.03  Thrombocytopenia -  129->114   Hospital day # 4  This patient is critically ill and at significant risk of neurological worsening, death and care requires constant monitoring of vital signs, hemodynamics,respiratory and cardiac monitoring,review of multiple databases, neurological assessment, discussion with family, other specialists and medical decision making of high complexity. I spent 30 minutes of neurocritical care time in the care of this patient.  Rosalin Hawking, MD PhD Stroke Neurology 11/09/2018 5:45 PM   To contact Stroke Continuity provider, please refer to [email or web address] After hours, contact General Neurology

## 2018-11-09 NOTE — Progress Notes (Signed)
Patient admitted to unit from ICU for comfort measures. Patient settled in bed and family in waiting room. Pt resting comfortably in bed, side rails up and call light within reach. Will continue to monitor closely for remainder of shift.

## 2018-11-09 NOTE — Progress Notes (Signed)
Assisted tele visit to patient with family member.  Anarie Kalish M, RN  

## 2018-11-09 NOTE — Progress Notes (Signed)
Assisted tele visit to patient with family member.  Maddison Kilner M, RN  

## 2018-11-09 NOTE — Progress Notes (Signed)
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 Code Stroke activated via tele neurology.CTA revealed L M1 occlsuion with significant penumbra. Deemed not a candidate for alteplase due to use of NOAC, on xarelto. Case d/w [facility] and pt transferred to IR for mechanical thrombectomy and revascularization.   Past Medical History  R hemispheric CVA /c chronic hemiparesis  Breast CA on Arimidex DVT on Eliquis  Osteoporosis on [place] [facility]   7/8 Mechanical thrombectomy and revascularization   Consults:  Neurology PCCM   Procedures/Lines:  7/8 Mechanical thrombectomy and revascularization.  Failed revascularization of left middle cerebral artery ETI 7/8>> L radAline 7/8>> 7/8 rt fem sheath>>7/9  Significant Diagnostic Tests:  CT/CTA head "IMPRESSION: 1. Emergent large vessel occlusion with embolism obstructing the left M1 segment. 2. No infarct by CT perfusion or prior noncontrast head CT. There penumbra throughout the left MCA territory (exact volume overestimated by motion artifact). 3. Non opacified left atrial appendage, question underlying atrial fibrillation. 4. Fibromuscular dysplasia in the neck without superimposed ulceration or stenosis. 5. Atherosclerosis is overall mild for age, although there is a notable moderate right supraclinoid ICA stenosis."  Echo 11/14/16  EF 55-60% G2 DD AV mild regurg MV mild regurg RA moderately dilated PFO TV mild regurg Micro Data:  SARS CoV 2 negative   Antimicrobials:  Not indicated   Interim history/subjective:  No change in neurological  condition. Family are prepared to withdraw care   Objective   Blood pressure (!) 140/53, pulse (!) 56, temperature 100.2 F (37.9 C), temperature source Oral, resp. rate 18, height 5' (1.524 m), weight 51.8 kg, SpO2 100 %.    Vent Mode: CPAP;PSV FiO2 (%):  [30 %] 30 % Set Rate:  [16 bmp] 16 bmp Vt Set:  [400 mL] 400 mL PEEP:  [5 cmH20] 5 cmH20 Pressure Support:  [10 cmH20] 10 cmH20 Plateau Pressure:  [13 cmH20-15 cmH20] 15 cmH20   Intake/Output Summary (Last 24 hours) at 11/09/2018 1150 Last data filed at 11/09/2018 1000 Gross per 24 hour  Intake 2320.72 ml  Output 1285 ml  Net 1035.72 ml   Filed Weights   11/05/18 1740 11/08/18 0900 11/09/18 0500  Weight: 51.1 kg 51.1 kg 51.8 kg    Examination: General: Frail elderly female with old contractions on the left HEENT: Endotracheal tube is in place no JVD lymphadenopathy is appreciated, downward gaze preference Neuro: Spontaneous movement noted does not follow commands.  Contractures on the left. No response to pain on right. CV: Heart sounds are regular, currently in sinus rhythm PULM: even/non-labored, lungs bilaterally clear. PQ:ZRAQ, non-tender, bsx4 active  Extremities: warm/dry, negative edema  Skin: no rashes or lesions   Resolved Hospital Problem list     Assessment & Plan:  This is a 85 yo F with Acute L MCA CVA s/p thrombectomy with resultant respiratory failure d/t inability to protect airway.   Acute L MCA CVA Plan Failed IR attempts for revascularization of left MCA Poor neurological recovery from large CVA with poor prognosis.  Critically ill due to acute respiratory failure requiring mechanical ventilation. Plan Compassionate extubation.  Goals of care Per neurology  85 year old with a failed interventional reopening of left MCA. We are transitioning to comfort care today and I do not expect patient to survive long thereafter.   Labs   CBC: Recent Labs  Lab 11/05/18 1223  11/06/18 0430 11/07/18  0853 11/07/18 2345 11/08/18 0329 11/09/18 0712  WBC 5.0  --  6.7 6.2  --  7.9 9.7  NEUTROABS 1.9  --  5.8 5.0  --  6.2  --   HGB 9.6*   < > 8.1* 6.9* 8.5* 8.7* 8.2*  HCT 31.6*   < > 26.0* 22.1* 26.9* 27.5* 26.1*  MCV 91.3  --  89.7 88.8  --  90.2 87.9  PLT 186  --  160 129*  --  114* 123*   < > = values in this interval not displayed.    Basic Metabolic Panel: Recent Labs  Lab 11/05/18 1223 11/05/18 1320 11/05/18 1805 11/06/18 0430 11/07/18 0853 11/08/18 0329 11/08/18 1638 11/09/18 0712  NA 141 140 142 139 143 140  --  140  K 4.0 5.0 3.6 3.9 3.6 3.6  --  3.6  CL 107 105  --  112* 116* 114*  --  114*  CO2 26  --   --  19* 17* 15*  --  18*  GLUCOSE 92 86  --  151* 104* 85  --  201*  BUN 40* 52*  --  30* 19 19  --  27*  CREATININE 1.32* 1.20*  --  1.30* 1.15* 1.03*  --  1.06*  CALCIUM 9.3  --   --  8.0* 7.6* 7.4*  --  7.2*  MG  --   --   --   --  1.6* 1.6* 1.7 1.7  PHOS  --   --   --   --  2.6 2.0* 2.5 1.4*   GFR: Estimated Creatinine Clearance: 23.3 mL/min (A) (by C-G formula based on SCr of 1.06 mg/dL (H)). Recent Labs  Lab 11/06/18 0430 11/07/18 0853 11/08/18 0329 11/09/18 0712  WBC 6.7 6.2 7.9 9.7    Liver Function Tests: Recent Labs  Lab 11/05/18 1223  AST 21  ALT 13  ALKPHOS 65  BILITOT 0.3  PROT 7.4  ALBUMIN 3.5   No results for input(s): LIPASE, AMYLASE in the last 168 hours. No results for input(s): AMMONIA in the last 168 hours.  ABG    Component Value Date/Time   PHART 7.510 (H) 11/05/2018 1805   PCO2ART 33.2 11/05/2018 1805   PO2ART 469.0 (H) 11/05/2018 1805   HCO3 26.5 11/05/2018 1805   TCO2 27 11/05/2018 1805   O2SAT 100.0 11/05/2018 1805     Coagulation Profile: Recent Labs  Lab 11/05/18 1223  INR 1.0    Cardiac Enzymes: No results for input(s): CKTOTAL, CKMB, CKMBINDEX, TROPONINI in the last 168 hours.  HbA1C: Hgb A1c MFr Bld  Date/Time Value Ref Range Status  11/06/2018 04:40 AM 6.2 (H) 4.8 - 5.6 % Final    Comment:     (NOTE)         Prediabetes: 5.7 - 6.4         Diabetes: >6.4         Glycemic control for adults with diabetes: <7.0     CBG: Recent Labs  Lab 11/08/18 1523 11/08/18 1912 11/08/18 2313 11/09/18 0309 11/09/18 0745  GLUCAP 74 98 112* 149* 166*   >35 min with >50% time spent in counseling and coordination of care.  Kipp Brood, MD [facility] ICU Physician [facility] [place]  Critical Care  Pager: [phone] Mobile: [phone] After hours: [phone].   11/09/2018, 11:50 AM

## 2018-11-09 NOTE — Procedures (Signed)
Extubation Procedure Note  Patient Details:   Name: Angela Higgins DOB: 10-14-1921 MRN: [id number]   Airway Documentation:  Airway 7.5 mm (Active)  Secured at (cm) 20 cm 11/09/18 0825  Measured From Lips 11/09/18 0825  Secured Location Center 11/09/18 0825  Secured By [employer] 11/09/18 0825  Tube Holder Repositioned Yes 11/09/18 0825  Cuff Pressure (cm H2O) 24 cm H2O 11/09/18 0825  Site Condition Dry 11/09/18 0825   Vent end date: (not recorded) Vent end time: (not recorded)   Evaluation  O2 sats: stable throughout Complications: No apparent complications Patient did tolerate procedure well. Bilateral Breath Sounds: Diminished   Yes   Pt extubated per MD order.  PT is a DNR and one way extubation.  RN at bedside.  RT will continue to monitor.  Pierre Bali 11/09/2018, 11:47 AM

## 2018-11-09 NOTE — Consult Note (Signed)
Responded to page from nurse re: family conversation re: goals of care. When arrived, pt was extubated. 5 family members were gathered in room, and sister on 2W was present on monitor. Heard family stories as they celebrated "Sister," offered emotional/ spiritual support, prayer.Family is of [place] tradition and prays in the name of Jesus. Standing by if further need of chaplain services.   Rev. Eloise Levels Chaplain

## 2018-11-10 ENCOUNTER — Encounter (HOSPITAL_COMMUNITY): Payer: Self-pay | Admitting: Interventional Radiology

## 2018-11-10 DIAGNOSIS — I495 Sick sinus syndrome: Secondary | ICD-10-CM | POA: Diagnosis present

## 2018-11-10 DIAGNOSIS — D696 Thrombocytopenia, unspecified: Secondary | ICD-10-CM | POA: Diagnosis present

## 2018-11-10 DIAGNOSIS — I82409 Acute embolism and thrombosis of unspecified deep veins of unspecified lower extremity: Secondary | ICD-10-CM | POA: Diagnosis present

## 2018-11-10 DIAGNOSIS — E785 Hyperlipidemia, unspecified: Secondary | ICD-10-CM | POA: Diagnosis present

## 2018-11-10 DIAGNOSIS — E441 Mild protein-calorie malnutrition: Secondary | ICD-10-CM | POA: Diagnosis present

## 2018-11-10 DIAGNOSIS — I10 Essential (primary) hypertension: Secondary | ICD-10-CM | POA: Diagnosis present

## 2018-11-10 DIAGNOSIS — I69391 Dysphagia following cerebral infarction: Secondary | ICD-10-CM

## 2018-11-10 DIAGNOSIS — N179 Acute kidney failure, unspecified: Secondary | ICD-10-CM | POA: Diagnosis present

## 2018-11-10 DIAGNOSIS — R1312 Dysphagia, oropharyngeal phase: Secondary | ICD-10-CM

## 2018-11-10 DIAGNOSIS — R54 Age-related physical debility: Secondary | ICD-10-CM | POA: Diagnosis present

## 2018-11-10 DIAGNOSIS — Q211 Atrial septal defect: Secondary | ICD-10-CM

## 2018-11-10 DIAGNOSIS — D62 Acute posthemorrhagic anemia: Secondary | ICD-10-CM | POA: Diagnosis not present

## 2018-11-10 DIAGNOSIS — Q2112 Patent foramen ovale: Secondary | ICD-10-CM

## 2018-11-10 MED ORDER — MORPHINE SULFATE (PF) 2 MG/ML IV SOLN
2.0000 mg | INTRAVENOUS | Status: DC | PRN
  Administered 2018-11-10: 2 mg via INTRAVENOUS
  Administered 2018-11-10 – 2018-11-11 (×7): 4 mg via INTRAVENOUS
  Filled 2018-11-10 (×6): qty 2
  Filled 2018-11-10: qty 1
  Filled 2018-11-10: qty 2

## 2018-11-10 NOTE — Plan of Care (Signed)
  Problem: Health Behavior/Discharge Planning: Goal: Ability to manage health-related needs will improve Outcome: Progressing   Problem: Ischemic Stroke/TIA Tissue Perfusion: Goal: Complications of ischemic stroke/TIA will be minimized Outcome: Progressing

## 2018-11-10 NOTE — Progress Notes (Signed)
Manufacturing engineer [facility]) Hospice  Received referral from [facility], for residential hospice.  Spoke with niece Lamona Curl, confirmed interest.  We will have a bed to offer Ms. Borghi on 7/14.  Lamona Curl had some concerns and wanted to send her brother and daughter to take a tour before they made a final decision.  Advised we are not currently allowing people to tour due to COVID 19, but they could go at any time and meet with the administrative staff in the lobby and ask any questions first hand.  They advised once a final decision has been made, they will follow up with Heather.    Thank you, Venia Carbon RN, BSN, [facility] Liaison (in [place]) [phone]

## 2018-11-10 NOTE — Progress Notes (Addendum)
STROKE TEAM PROGRESS NOTE   INTERVAL HISTORY Pt family is at bedside. Pt slight increased work of breathing but comfortable not in distress. On morphine PRN. Social worker is working on residential hospice.   Vitals:   11/09/18 1300 11/09/18 1400 11/09/18 1514 11/10/18 0541  BP:   132/74 (!) 166/64  Pulse: 74 72 90 68  Resp: (!) 21 (!) 22 (!) 21 (!) 26  Temp:   98.6 F (37 C) 98.7 F (37.1 C)  TempSrc:   Axillary Axillary  SpO2: 92% 93% 95% 97%  Weight:      Height:        CBC:  Recent Labs  Lab 11/07/18 0853  11/08/18 0329 11/09/18 0712  WBC 6.2  --  7.9 9.7  NEUTROABS 5.0  --  6.2  --   HGB 6.9*   < > 8.7* 8.2*  HCT 22.1*   < > 27.5* 26.1*  MCV 88.8  --  90.2 87.9  PLT 129*  --  114* 123*   < > = values in this interval not displayed.    Basic Metabolic Panel:  Recent Labs  Lab 11/08/18 0329 11/08/18 1638 11/09/18 0712  NA 140  --  140  K 3.6  --  3.6  CL 114*  --  114*  CO2 15*  --  18*  GLUCOSE 85  --  201*  BUN 19  --  27*  CREATININE 1.03*  --  1.06*  CALCIUM 7.4*  --  7.2*  MG 1.6* 1.7 1.7  PHOS 2.0* 2.5 1.4*   Lipid Panel:     Component Value Date/Time   CHOL 188 11/06/2018 0430   TRIG 75 11/06/2018 0430   HDL 63 11/06/2018 0430   CHOLHDL 3.0 11/06/2018 0430   VLDL 15 11/06/2018 0430   LDLCALC 110 (H) 11/06/2018 0430   HgbA1c:  Lab Results  Component Value Date   HGBA1C 6.2 (H) 11/06/2018   Urine Drug Screen:     Component Value Date/Time   LABOPIA NONE DETECTED 11/05/2018 2024   COCAINSCRNUR NONE DETECTED 11/05/2018 2024   LABBENZ NONE DETECTED 11/05/2018 2024   AMPHETMU NONE DETECTED 11/05/2018 2024   THCU NONE DETECTED 11/05/2018 2024   LABBARB NONE DETECTED 11/05/2018 2024    Alcohol Level     Component Value Date/Time   ETH <10 11/05/2018 1223    IMAGING  Ct Head Code Stroke Wo Contrast 11/05/2018 1238 1. Hyperdense left MCA suggesting large vessel embolism. No hemorrhage or visible infarct. ASPECTS is 10.  2. Remote  right lateral lenticulostriate infarct.   Ct Angio Head W Or Wo Contrast Ct Angio Neck W And/or Wo Contrast Ct Cerebral Perfusion W Contrast 11/05/2018 1320 1. Emergent large vessel occlusion with embolism obstructing the left M1 segment.  2. No infarct by CT perfusion or prior noncontrast head CT. There penumbra throughout the left MCA territory (exact volume overestimated by motion artifact).  3. Non opacified left atrial appendage, question underlying atrial fibrillation.  4. Fibromuscular dysplasia in the neck without superimposed ulceration or stenosis.  5. Atherosclerosis is overall mild for age, although there is a notable moderate right supraclinoid ICA stenosis.   Cerebral angiogram 11/05/2018 S/P Lt common carotid arteriogram followed by endovascular revascularization of occluded Lt MCa M 1 seg with x 2 passes with 70mm x 33 mm embotrap retriever device and x 1 pass with 67mm x 40 mm solitaireX retriever device achieving a TICI 2 B revascularization with reocclusion due to  underlying severe  ICAD..  Mr Herby Abraham Contrast 11/06/2018 0208 1. Moderate-sized evolving acute ischemic left MCA territory infarct involving the left basal ganglia and left frontotemporal region. No associated hemorrhage or significant regional mass effect.  2. Absent flow void within the distal left MCA branches, likely occluded.  3. Underlying age-related cerebral atrophy with mild chronic small vessel ischemic disease and chronic right basal ganglia lacunar infarct.    Dg Chest Port 1 View 11/05/2018 Endotracheal tube 12 mm above the carina and should be withdrawn 1-2 cm. No focal infiltrate or effusion is seen. Sent to IR with partial recanalization.  Transthoracic Echocardiogram  11/06/2018 IMPRESSIONS  1. The left ventricle has hyperdynamic systolic function, with an ejection fraction of >65%. The cavity size was normal. Left ventricular diastolic Doppler parameters are indeterminate. No evidence of left  ventricular regional wall motion abnormalities.  2. The right ventricle has normal systolic function. The cavity was normal. There is no increase in right ventricular wall thickness. Right ventricular systolic pressure is mildly elevated with an estimated pressure of 40.4 mmHg.  3. Left atrial size was moderately dilated.  4. Right atrial size was moderately dilated.  5. Small pericardial effusion.  6. Tricuspid valve regurgitation is mild-moderate.  7. The aortic valve is tricuspid. Aortic valve regurgitation is mild to moderate by color flow Doppler. No stenosis of the aortic valve.  8. The aortic root, ascending aorta and descending aorta are normal in size and structure.  9. The inferior vena cava was dilated in size with <50% respiratory variability. 10. Moderately sized patent foramen ovale with predominantly left to right shunting across the atrial septum. 11. Evidence of atrial level shunting detected by color flow Doppler.  EEG 11/07/2018 EEG Abnormalities:  1) left-sided slow activity 2) attenuation of faster frequencies on the left Clinical Interpretation: This EEG is consistent with a focal disturbance in cerebral function in the left hemisphere.  There was no seizure or seizure predisposition recorded on this study. Please note that lack of epileptiform activity on EEG does not preclude the possibility of epilepsy.    PHYSICAL EXAM Frail elderly African-American lady whose was extubated still unresponsive. Afebrile. Head is nontraumatic. Neck is supple without bruit.    Cardiac exam no murmur or gallop. Lungs are coarse rhonchi. Distal pulses are well felt. Neurological Exam: Limited due to comfort care measures. Patient is unresponsive. Eyes are closed.  She does not open eyes or follow any commands.  Slightly increased work of breath. Right mild facial droop. No movement of all extremities.    ASSESSMENT/PLAN Ms. TAMANTHA SALINE is a 85 y.o. female with history of DVT on  Xarelto presenting to [facility] ED with sudden onset R sided weakness and aphasia after a sneeze. CTA showed L M1 occlusion.  Stroke:   L MCA infarct due to left M1 occlusion s/p IR with initial partial recanalization but then re-occluded, likely secondary to large vessel disease source  Code Stroke CT head No infarct. Hyperdense L MCA. Old R lateral lenticulostriate infarct.    CTA head & neck ELVO L M1. Non opacified LA appendage. FMD neck.   CT perfusion L MCA penumbra.   Cerebral angio occluded L M1, TICI2b revascularization with reocclusion. Underlying ICAD.  MRI  Mod L MCA infarct (L BG, L frontotemporal). Absent flow L MCA branches. Old R BG lacune.  2D Echo - EF > 65% Moderately sized PFO.  LDL 110  HgbA1c - 6.2  EEG - 11/07/18 - left hemisphere slowing  SCDs  for VTE prophylaxis  aspirin 81 mg daily and Xarelto (rivaroxaban) daily prior to admission  Disposition:  pending   Survived terminal wean. On the floor. Comfort care.  Social work consult for residential hospice placement  Acute Respiratory Failure, unable to protect airway  Intubated for IR  Terminally weaned  On comfort care measures  Bradycardia/tachycardia  HR 35-120s-suspect sick sinus syndrome  Also suspect occult afib for underlying current stroke  Avoid beta blockers  On comfort care  Hypertension  Home meds:  Amlodipine 5, lisinopril-HCTZ 20-25 . Treated with Cleviprex s/p IR x 24h post IR  Hyperlipidemia  Home meds:  No statin  LDL 110, goal < 70  Dysphagia . Secondary to stroke . NPO . 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 They expressed understanding and appreciation.    Rosalin Hawking, MD PhD Stroke Neurology 11/10/2018 11:03 AM   To contact Stroke Continuity provider, please refer to [email or web address] After hours, contact General Neurology

## 2018-11-10 NOTE — Discharge Summary (Addendum)
Stroke Discharge Summary  Patient ID: Angela Higgins   MRN: [id number]      DOB: [birth year]  Date of Admission: 11/05/2018 Date of Discharge: 11/11/2018  Attending Physician:  Rosalin Hawking, MD, Stroke MD Consultant(s):   Olene Craven) Estanislado Pandy, MD (Interventional Neuroradiologist), Kara Mead, MD (pulmonary/intensive care) Patient's PCP:  Rosita Fire, MD  DISCHARGE DIAGNOSIS:  Principal Problem:   Stroke (cerebrum) [facility]) Active Problems:   Hormone receptor positive breast cancer, right ([place])   Middle cerebral artery embolism, left   Acute respiratory failure ([place])   Tachy-brady syndrome ([place]) - possible AF vs sick sinus syndrome   Essential hypertension   Hyperlipidemia   Dysphagia due to recent cerebral infarction   PFO (patent foramen ovale)   DVT (deep venous thrombosis) ([place]), bilateral Le   Advanced age   Acute blood loss anemia   AKI (acute kidney injury) ([place])   Thrombocytopenia (H[facility])   Mild malnutrition ([place])   Past Medical History:  Diagnosis Date  . Breast cancer ([place])   . DVT (deep venous thrombosis) ([place])    bilateral  . Hormone receptor positive breast cancer, right ([place]) 02/12/2016  . Hypertension   . Paralysis [facility])    Past Surgical History:  Procedure Laterality Date  . APPENDECTOMY    . HERNIA REPAIR    . IR CT HEAD LTD  11/05/2018  . IR PERCUTANEOUS ART THROMBECTOMY/INFUSION INTRACRANIAL INC DIAG ANGIO  11/05/2018  . RADIOLOGY WITH ANESTHESIA N/A 11/05/2018   Procedure: IR WITH ANESTHESIA;  Surgeon: Luanne Bras, MD;  Location: [place];  Service: Radiology;  Laterality: N/A;    Allergies as of 11/11/2018   No Known Allergies     Medication List    STOP taking these medications   amLODipine 5 MG tablet Commonly known as: NORVASC   anastrozole 1 MG tablet Commonly known as: ARIMIDEX   aspirin EC 81 MG tablet   baclofen 10 MG tablet Commonly known as: LIORESAL   lisinopril-hydrochlorothiazide 20-25 MG tablet Commonly known as:  ZESTORETIC   Xarelto 20 MG Tabs tablet Generic drug: rivaroxaban       LABORATORY STUDIES CBC    Component Value Date/Time   WBC 9.7 11/09/2018 0712   RBC 2.97 (L) 11/09/2018 0712   HGB 8.2 (L) 11/09/2018 0712   HCT 26.1 (L) 11/09/2018 0712   PLT 123 (L) 11/09/2018 0712   MCV 87.9 11/09/2018 0712   MCH 27.6 11/09/2018 0712   MCHC 31.4 11/09/2018 0712   RDW 15.9 (H) 11/09/2018 0712   LYMPHSABS 0.8 11/08/2018 0329   MONOABS 0.6 11/08/2018 0329   EOSABS 0.2 11/08/2018 0329   BASOSABS 0.0 11/08/2018 0329   CMP    Component Value Date/Time   NA 140 11/09/2018 0712   K 3.6 11/09/2018 0712   CL 114 (H) 11/09/2018 0712   CO2 18 (L) 11/09/2018 0712   GLUCOSE 201 (H) 11/09/2018 0712   BUN 27 (H) 11/09/2018 0712   CREATININE 1.06 (H) 11/09/2018 0712   CALCIUM 7.2 (L) 11/09/2018 0712   PROT 7.4 11/05/2018 1223   ALBUMIN 3.5 11/05/2018 1223   AST 21 11/05/2018 1223   ALT 13 11/05/2018 1223   ALKPHOS 65 11/05/2018 1223   BILITOT 0.3 11/05/2018 1223   GFRNONAA 45 (L) 11/09/2018 0712   GFRAA 52 (L) 11/09/2018 0712   COAGS Lab Results  Component Value Date   INR 1.0 11/05/2018   INR 2.10 11/13/2016   Lipid Panel    Component Value Date/Time  CHOL 188 11/06/2018 0430   TRIG 75 11/06/2018 0430   HDL 63 11/06/2018 0430   CHOLHDL 3.0 11/06/2018 0430   VLDL 15 11/06/2018 0430   LDLCALC 110 (H) 11/06/2018 0430   HgbA1C  Lab Results  Component Value Date   HGBA1C 6.2 (H) 11/06/2018   Urinalysis    Component Value Date/Time   COLORURINE STRAW (A) 11/05/2018 2018   APPEARANCEUR CLEAR 11/05/2018 2018   LABSPEC 1.036 (H) 11/05/2018 2018   PHURINE 7.0 11/05/2018 2018   GLUCOSEU NEGATIVE 11/05/2018 2018   HGBUR NEGATIVE 11/05/2018 2018   BILIRUBINUR NEGATIVE 11/05/2018 2018   KETONESUR NEGATIVE 11/05/2018 2018   PROTEINUR NEGATIVE 11/05/2018 2018   NITRITE NEGATIVE 11/05/2018 2018   LEUKOCYTESUR NEGATIVE 11/05/2018 2018   Urine Drug Screen     Component Value  Date/Time   LABOPIA NONE DETECTED 11/05/2018 2024   COCAINSCRNUR NONE DETECTED 11/05/2018 2024   LABBENZ NONE DETECTED 11/05/2018 2024   AMPHETMU NONE DETECTED 11/05/2018 2024   THCU NONE DETECTED 11/05/2018 2024   LABBARB NONE DETECTED 11/05/2018 2024    Alcohol Level    Component Value Date/Time   ETH <10 11/05/2018 1223    SIGNIFICANT DIAGNOSTIC STUDIES Ct Head Code Stroke Wo Contrast 11/05/2018 1238 1. Hyperdense left MCA suggesting large vessel embolism. No hemorrhage or visible infarct. ASPECTS is 10.  2. Remote right lateral lenticulostriate infarct.   Ct Angio Head W Or Wo Contrast Ct Angio Neck W And/or Wo Contrast Ct Cerebral Perfusion W Contrast 11/05/2018 1320 1. Emergent large vessel occlusion with embolism obstructing the left M1 segment.  2. No infarct by CT perfusion or prior noncontrast head CT. There penumbra throughout the left MCA territory (exact volume overestimated by motion artifact).  3. Non opacified left atrial appendage, question underlying atrial fibrillation.  4. Fibromuscular dysplasia in the neck without superimposed ulceration or stenosis.  5. Atherosclerosis is overall mild for age, although there is a notable moderate right supraclinoid ICA stenosis.   Cerebral angiogram 11/05/2018 S/P Lt common carotid arteriogram followed by endovascular revascularization of occluded 35 MCa M 1 seg with x 2 passes with 17mm x 33 mm embotrap retriever device and x 1 pass with 10mm x 40 mm solitaireX retriever device achieving a TICI 2 B revascularization with reocclusion due to underlying severe ICAD..  Mr Brain Wo Contrast 11/06/2018 0208 1. Moderate-sized evolving acute ischemic left MCA territory infarct involving the left basal ganglia and left frontotemporal region. No associated hemorrhage or significant regional mass effect.  2. Absent flow void within the distal left MCA branches, likely occluded.  3. Underlying age-related cerebral atrophy with mild chronic  small vessel ischemic disease and chronic right basal ganglia lacunar infarct.    Dg Chest Port 1 View 11/05/2018 Endotracheal tube 12 mm above the carina and should be withdrawn 1-2 cm. No focal infiltrate or effusion is seen. Sent to IR with partial recanalization.  Transthoracic Echocardiogram  11/06/2018 IMPRESSIONS 1. The left ventricle has hyperdynamic systolic function, with an ejection fraction of >65%. The cavity size was normal. Left ventricular diastolic Doppler parameters are indeterminate. No evidence of left ventricular regional wall motion abnormalities. 2. The right ventricle has normal systolic function. The cavity was normal. There is no increase in right ventricular wall thickness. Right ventricular systolic pressure is mildly elevated with an estimated pressure of 40.4 mmHg. 3. Left atrial size was moderately dilated. 4. Right atrial size was moderately dilated. 5. Small pericardial effusion. 6. Tricuspid valve regurgitation is mild-moderate. 7. The  aortic valve is tricuspid. Aortic valve regurgitation is mild to moderate by color flow Doppler. No stenosis of the aortic valve. 8. The aortic root, ascending aorta and descending aorta are normal in size and structure. 9. The inferior vena cava was dilated in size with <50% respiratory variability. 10. Moderately sized patent foramen ovale with predominantly left to right shunting across the atrial septum. 11. Evidence of atrial level shunting detected by color flow Doppler.  EEG 11/07/2018 EEG Abnormalities: 1)left-sided slow activity 2)attenuation of faster frequencies on the left Clinical Interpretation: ThisEEG is consistent with a focal disturbance in cerebral function in the left hemisphere. There was no seizure or seizure predisposition recorded on this study. Please note that lack of epileptiform activity on EEG does not preclude the possibility of epilepsy.    PHYSICAL EXAM Frail elderly  African-American lady whose was extubated still unresponsive. Afebrile. Head is nontraumatic. Neck is supple without bruit.    Cardiac exam no murmur or gallop. Lungs are coarse rhonchi. Distal pulses are well felt. Neurological Exam: Limited due to comfort care measures. Patient is unresponsive. Eyes are closed.  She does not open eyes or follow any commands.  Slightly increased work of breath. Right mild facial droop. No movement of all extremities.      HISTORY OF PRESENT ILLNESS SAMARI BITTINGER is a 85 y.o. female with a history of DVT on anticoagulationwho was in her normal state of health until she was eating lunch around noon developed sudden right-sided weakness (LKW 11/05/2018 at 1200).  She reportedly sneezed and subsequently became "unresponsive."  She was taken to [facility] where she was seen to have right-sided weakness and aphasia and tele-neurology evaluated the patient and a CTA was performed which demonstrated a left M1 occlusion with significant penumbra by CT perfusion. She does have a history of breast cancer, but read the notes it sounds like she is been fairly stable with most recent imaging on June 9 of this year. 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 She was not a tpa candidate given being on anticoagulation. She was transferred to [facility] for IR thrombectomy. Modified Rankin Scale: 2.  HOSPITAL COURSE Ms. JAMAE TISON is a 85 y.o. female with history of DVT on Xarelto presenting to [facility] ED with sudden onset R sided weakness and aphasia after a sneeze. CTA showed L M1 occlusion.   Stroke:   L MCA infarct due to left M1 occlusion s/p IR with initial partial recanalization but then re-occluded, likely secondary to large vessel disease source  Code Stroke CT head No infarct. Hyperdense L MCA. Old R lateral lenticulostriate infarct.    CTA head & neck ELVO L M1. Non opacified LA appendage. FMD neck.   CT  perfusion L MCA penumbra.   Cerebral angio occluded L M1, TICI2b revascularization with reocclusion. Underlying ICAD.  MRI  Mod L MCA infarct (L BG, L frontotemporal). Absent flow L MCA branches. Old R BG lacune.  2D Echo - EF > 65% Moderately sized PFO.  LDL 110  HgbA1c - 6.2  EEG - 11/07/18 - left hemisphere slowing  aspirin 81 mg daily and Xarelto (rivaroxaban) daily prior to admission  Poor neurologic prognosis. Family opted for comfort care.  Social work consulted for residential hospice placement  Disposition:  [facility] - comfort care with meds per care team there  Acute Respiratory Failure  Intubated for IR  Terminally  weaned  Survived terminal wean. Transferred to the floor.   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 rate (!) 21, height 5' (1.524 m), weight 51.8 kg, SpO2 95 %. ICAL EXAM Frail elderly African-American lady whose was extubated still unresponsive. Afebrile. Head is nontraumatic. Neck is supple without bruit.    Cardiac exam no murmur or gallop. Lungs are coarse rhonchi. Distal  pulses are well felt. Neurological Exam: Limited due to comfort care measures. Patient is unresponsive. Eyes are closed.  She does not open eyes or follow any commands.  Slightly increased work of breath. Right mild facial droop. No movement of all extremities.    Discharge Diet   Comfort feeds if able   DISCHARGE PLAN  Disposition:  [place] for residential Hospice and ongoing comfort care  40 minutes were spent preparing discharge.  Rosalin Hawking, MD PhD Stroke Neurology 11/11/2018 2:34 PM

## 2018-11-10 NOTE — Care Management (Signed)
Was told in progression plan is for residential hospice. Went to room to speak with family. Four family members present , niece x2 great niece , and nephrew. Decision maker is patient's sister [name] who is currently a patient 2w08. Per family and staff, Ms Leda Quail is being discharged this morning and coming to patient's room. Will return once Ms Leda Quail present.  Magdalen Spatz RN BSN [phone]

## 2018-11-10 NOTE — Care Management Important Message (Signed)
Important Message  Patient Details  Name: Angela Higgins MRN: [id number] Date of Birth: Jul 12, 1921   Medicare Important Message Given:  Yes     Memory Argue 11/10/2018, 4:17 PM

## 2018-11-10 NOTE — Progress Notes (Signed)
Erlinda Hong, MD paged in regards to pt experiencing labored breathing; Awaiting orders. Will continue to monitor pt.

## 2018-11-10 NOTE — Progress Notes (Signed)
Nutrition Brief Note  Chart reviewed. Pt now transitioning to comfort care.  No further nutrition interventions warranted at this time.  Please re-consult as needed.   Zoiey Christy RD, LDN, CNSC [phone] Pager [phone] After Hours Pager    

## 2018-11-10 NOTE — Plan of Care (Signed)
  Problem: Clinical Measurements: Goal: Quality of life will improve Outcome: Progressing   Problem: Role Relationship: Goal: Family's ability to cope with current situation will improve Outcome: Progressing Goal: Ability to verbalize concerns, feelings, and thoughts to partner or family member will improve Outcome: Progressing   Problem: Pain Management: Goal: Satisfaction with pain management regimen will improve Outcome: Progressing   

## 2018-11-10 NOTE — TOC Initial Note (Signed)
Transition of Care [facility]) - Initial/Assessment Note    Patient Details  Name: Angela Higgins MRN: [id number] Date of Birth: [birth year]  Transition of Care [facility]) CM/SW Contact:    Marilu Favre, RN Phone Number: 11/10/2018, 1:08 PM  Clinical Narrative:                 Patient's sister Angela Higgins , Angela Higgins and nephew currently at bedside.   Discussed residential hospice. Everyone in agreement.   Offered choice including [facility], [facility] and [facility].   Angela is being discharged from hospital today, and is planning on staying in [place] with [facility]. Family prefers [facility] due to location. Asked who the contact person will be for [facility]. Angela asks Angela Higgins calls [facility] since she will be with [facility]. Angela Higgins in agreement and her cell is [phone].   Referral given to [facility] with [facility]. Await call back.   Placed gold DNR form on shadow chart for MD signature. Dr Erlinda Hong aware.    Expected Discharge Plan: [place]     Patient Goals and CMS Choice Patient states their goals for this hospitalization and ongoing recovery are:: patient non verbal CMS Medicare.gov Compare Post Acute Care list provided to:: Patient Represenative (must comment)(sister Angela Higgins) Choice offered to / list presented to : Sibling  Expected Discharge Plan and Services Expected Discharge Plan: [place]   Discharge Planning Services: CM Consult Post Acute Care Choice: Hospice Living arrangements for the past 2 months: Single Family Home                 DME Arranged: N/A         HH Arranged: NA          Prior Living Arrangements/Services Living arrangements for the past 2 months: Single Family Home Lives with:: Relatives Patient language and need for interpreter reviewed:: Yes        Need for Family Participation in Patient Care: Yes (Comment) Care giver support system in  place?: Yes (comment)   Criminal Activity/Legal Involvement Pertinent to Current Situation/Hospitalization: No - Comment as needed  Activities of Daily Living      Permission Sought/Granted                  Emotional Assessment              Admission diagnosis:  Cerebrovascular accident (CVA) due to thrombosis of precerebral artery ([place]) [I63.00] Patient Active Problem List   Diagnosis Date Noted  . Stroke (cerebrum) ([place]) 11/05/2018  . Stroke ([place]) 11/05/2018  . Middle cerebral artery embolism, left 11/05/2018  . Acute respiratory failure ([place])   . Osteoporosis 11/19/2017  . Elevated troponin 11/14/2016  . Nausea & vomiting 11/14/2016  . Hormone receptor positive breast cancer, right ([place]) 02/12/2016  . DEGENERATIVE DISC DISEASE, LUMBAR SPINE 09/08/2009  . SCIATICA 09/08/2009  . SCOLIOSIS, LUMBAR SPINE 09/08/2009   PCP:  Rosita Fire, MD Pharmacy:   [facility], [address] [place] [address] Phone: [phone] Fax: [phone]     Social Determinants of Health (SDOH) Interventions    Readmission Risk Interventions No flowsheet data found.

## 2018-11-11 DIAGNOSIS — D696 Thrombocytopenia, unspecified: Secondary | ICD-10-CM

## 2018-11-11 DIAGNOSIS — I69391 Dysphagia following cerebral infarction: Secondary | ICD-10-CM

## 2018-11-11 DIAGNOSIS — N179 Acute kidney failure, unspecified: Secondary | ICD-10-CM

## 2018-11-11 NOTE — Progress Notes (Signed)
Manufacturing engineer [facility]) Hospital Liaison note.   Received request from Magdalen Spatz, [facility] for family interest in [facility] with request for transfer today. Chart reviewed and eligibility confirmed. Apoke to family to confirm interest and explain services. Family agreeable to transfer today.  Registration paper work completed. Dr. Orpah Melter to assume care per family request.   Please fax discharge summary to [phone]. RN please call report to [phone]. Please arrange transport for patient.    Thank you,      Shaniyah Wix, RN, [facility]   [place]     [place] are on [facility]

## 2018-11-11 NOTE — TOC Transition Note (Signed)
Transition of Care [facility]) - CM/SW Discharge Note   Patient Details  Name: Angela Higgins MRN: [id number] Date of Birth: March 28, 1922  Transition of Care [facility]) CM/SW Contact:  Marilu Favre, RN Phone Number: 11/11/2018, 2:10 PM   Clinical Narrative:        Angela Higgins ha received paperwork and is ready for patient to be transferred. Nurse ready. PTAR called. Nephew at bedside aware and called Lamona Curl she is aware. Bedside nurse will call report.      Patient Goals and CMS Choice Patient states their goals for this hospitalization and ongoing recovery are:: patient non verbal CMS Medicare.gov Compare Post Acute Care list provided to:: Patient Represenative (must comment)(sister [name]) Choice offered to / list presented to : Sibling  Discharge Placement                       Discharge Plan and Services   Discharge Planning Services: CM Consult Post Acute Care Choice: Hospice          DME Arranged: N/A         HH Arranged: NA          Social Determinants of Health (SDOH) Interventions     Readmission Risk Interventions No flowsheet data found.

## 2018-11-11 NOTE — Progress Notes (Signed)
Updated patient's niece Neldon Labella at [phone].

## 2018-11-11 NOTE — Progress Notes (Signed)
Report given to Stanton Kidney, Therapist, sports at [facility]

## 2018-11-11 NOTE — Care Management (Signed)
Spoke with family earlier this morning, they are in agreement for patient to be transferred to [facility] today.   Spoke with Audrea Muscat with [facility] , once family completes paperwork , [facility] will be ready for patient to be transported. Yaire Kreher will notify NCM when paperwork completed.   PTAR paperwork and DNR form in shadow chart.    Magdalen Spatz RN BSN [phone]

## 2018-11-14 ENCOUNTER — Encounter (HOSPITAL_COMMUNITY): Payer: Self-pay | Admitting: Interventional Radiology

## 2018-11-14 NOTE — Addendum Note (Signed)
Addendum  created 11/14/18 1431 by Audry Pili, MD   Intraprocedure Event edited, Intraprocedure Staff edited

## 2018-11-29 DEATH — deceased

## 2019-01-19 ENCOUNTER — Other Ambulatory Visit (HOSPITAL_COMMUNITY): Payer: Self-pay

## 2019-01-19 ENCOUNTER — Ambulatory Visit (HOSPITAL_COMMUNITY): Payer: Self-pay

## 2019-01-20 ENCOUNTER — Ambulatory Visit (HOSPITAL_COMMUNITY): Payer: Medicare HMO

## 2019-01-20 ENCOUNTER — Ambulatory Visit (HOSPITAL_COMMUNITY): Payer: Medicare HMO | Admitting: Hematology

## 2019-01-20 ENCOUNTER — Other Ambulatory Visit (HOSPITAL_COMMUNITY): Payer: Medicare HMO

## 2019-10-05 IMAGING — DX PORTABLE ABDOMEN - 1 VIEW
1 series · 1 of 1 positions shown · non-contrast
Comparison: None.

CLINICAL DATA: Orogastric tube placement.

EXAM:
PORTABLE ABDOMEN - 1 VIEW

[abdomen kub]
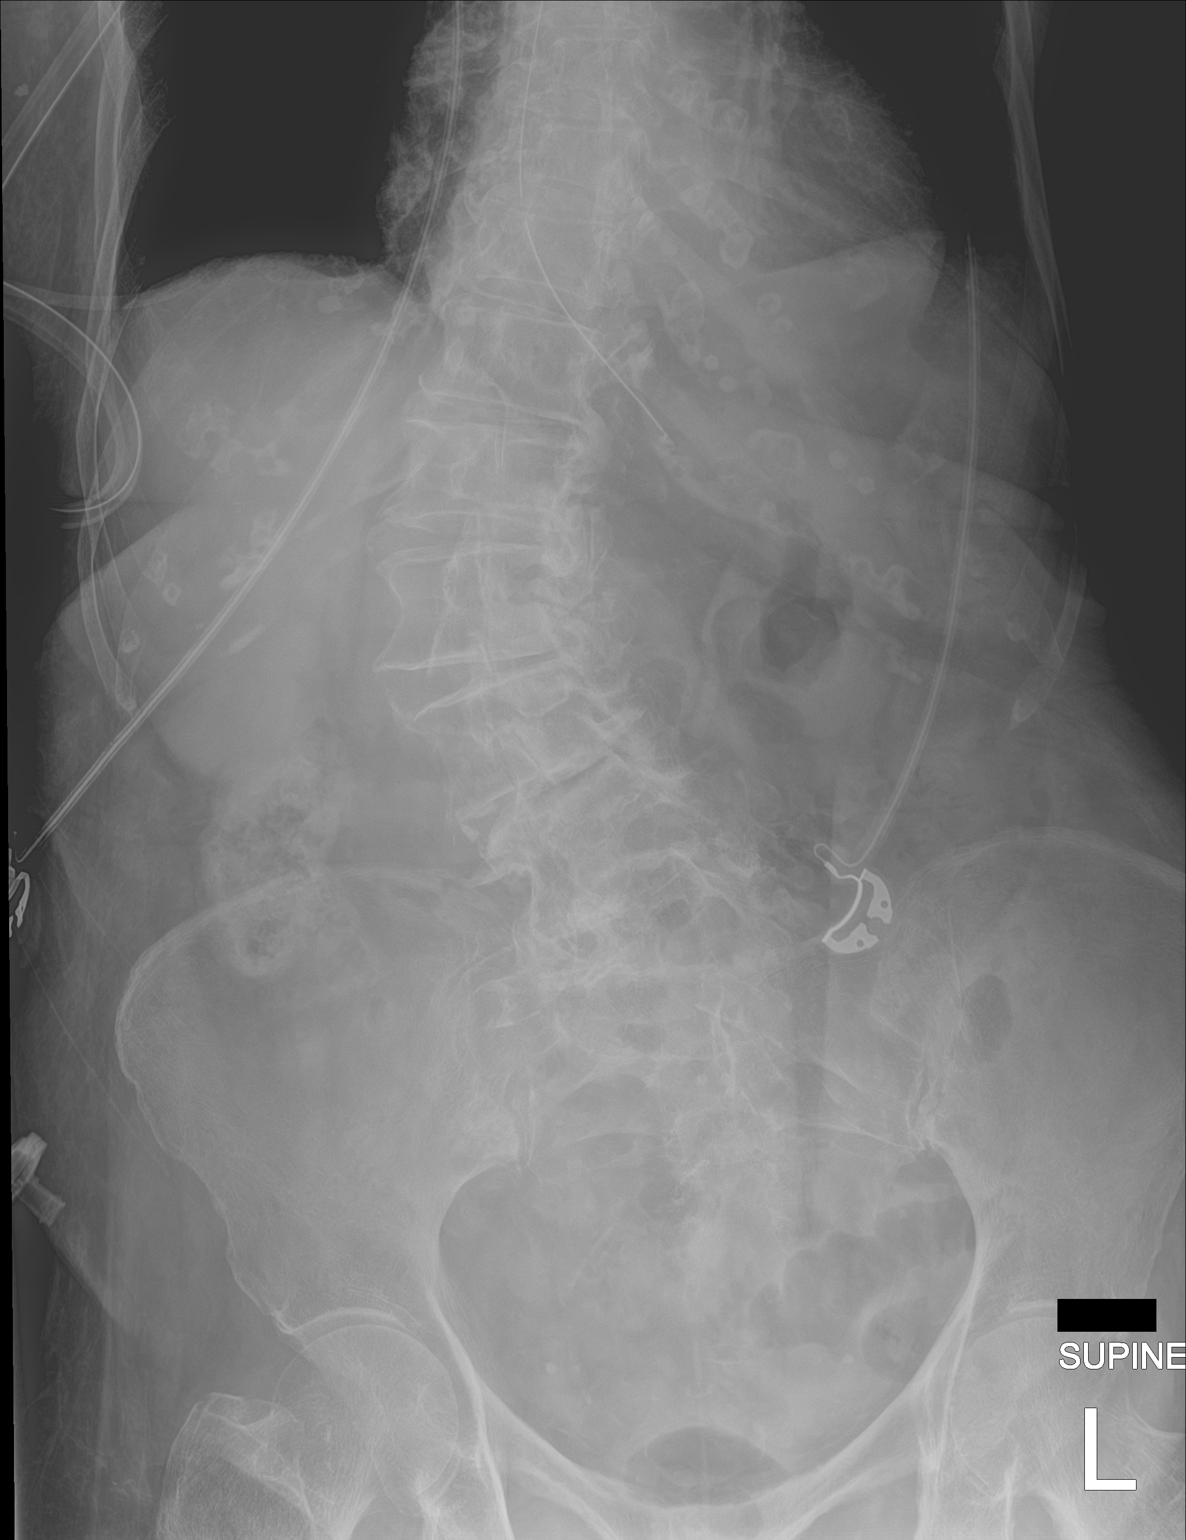

[1 of 1 positions shown; findings below may reference images not displayed]

FINDINGS: The bowel gas pattern is normal. Distal tip of enteric tube is seen
in expected position of gastroesophageal junction. No radio-opaque
calculi or other significant radiographic abnormality are seen.
IMPRESSION: Distal tip of enteric tube is seen in expected position of
gastroesophageal junction; advancement is recommended. No evidence
of bowel obstruction or ileus.

## 2023-12-11 ENCOUNTER — Other Ambulatory Visit: Payer: Self-pay
